# Patient Record
Sex: Male | Born: 1945 | Race: White | Hispanic: No | Marital: Married | Smoking: Former smoker
Health system: Southern US, Community
[De-identification: ages and names within clinical notes are randomized; demographics above are authoritative.]

## PROBLEM LIST (undated history)

## (undated) DIAGNOSIS — E785 Hyperlipidemia, unspecified: Secondary | ICD-10-CM

## (undated) DIAGNOSIS — K649 Unspecified hemorrhoids: Secondary | ICD-10-CM

## (undated) DIAGNOSIS — M069 Rheumatoid arthritis, unspecified: Secondary | ICD-10-CM

## (undated) DIAGNOSIS — G5600 Carpal tunnel syndrome, unspecified upper limb: Secondary | ICD-10-CM

## (undated) DIAGNOSIS — N200 Calculus of kidney: Secondary | ICD-10-CM

## (undated) DIAGNOSIS — I350 Nonrheumatic aortic (valve) stenosis: Secondary | ICD-10-CM

## (undated) DIAGNOSIS — E119 Type 2 diabetes mellitus without complications: Secondary | ICD-10-CM

## (undated) HISTORY — DX: Rheumatoid arthritis, unspecified: M06.9

## (undated) HISTORY — DX: Nonrheumatic aortic (valve) stenosis: I35.0

## (undated) HISTORY — DX: Calculus of kidney: N20.0

## (undated) HISTORY — DX: Carpal tunnel syndrome, unspecified upper limb: G56.00

## (undated) HISTORY — DX: Hyperlipidemia, unspecified: E78.5

## (undated) HISTORY — DX: Type 2 diabetes mellitus without complications: E11.9

## (undated) HISTORY — PX: CARPAL TUNNEL RELEASE: SHX101

---
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
## 2003-04-16 ENCOUNTER — Encounter: Payer: Self-pay | Admitting: *Deleted

## 2003-04-16 ENCOUNTER — Emergency Department (HOSPITAL_COMMUNITY): Admission: EM | Admit: 2003-04-16 | Discharge: 2003-04-16 | Payer: Self-pay | Admitting: *Deleted

## 2005-06-05 ENCOUNTER — Emergency Department (HOSPITAL_COMMUNITY): Admission: EM | Admit: 2005-06-05 | Discharge: 2005-06-05 | Payer: Self-pay | Admitting: Emergency Medicine

## 2006-04-22 ENCOUNTER — Ambulatory Visit: Payer: Self-pay | Admitting: Gastroenterology

## 2010-05-04 ENCOUNTER — Ambulatory Visit: Payer: Self-pay | Admitting: Specialist

## 2010-05-17 ENCOUNTER — Ambulatory Visit: Payer: Self-pay | Admitting: Specialist

## 2011-03-26 ENCOUNTER — Encounter: Payer: Self-pay | Admitting: Rheumatology

## 2011-09-26 ENCOUNTER — Ambulatory Visit: Payer: Self-pay | Admitting: Internal Medicine

## 2011-10-12 ENCOUNTER — Ambulatory Visit: Payer: Self-pay | Admitting: Internal Medicine

## 2013-06-03 ENCOUNTER — Ambulatory Visit (INDEPENDENT_AMBULATORY_CARE_PROVIDER_SITE_OTHER): Payer: Medicare Other | Admitting: Internal Medicine

## 2013-06-03 ENCOUNTER — Encounter: Payer: Self-pay | Admitting: Internal Medicine

## 2013-06-03 VITALS — BP 120/74 | HR 67 | Temp 98.0°F | Ht 70.0 in | Wt 172.5 lb

## 2013-06-03 DIAGNOSIS — E119 Type 2 diabetes mellitus without complications: Secondary | ICD-10-CM

## 2013-06-03 DIAGNOSIS — N200 Calculus of kidney: Secondary | ICD-10-CM

## 2013-06-03 DIAGNOSIS — G5600 Carpal tunnel syndrome, unspecified upper limb: Secondary | ICD-10-CM

## 2013-06-03 DIAGNOSIS — M069 Rheumatoid arthritis, unspecified: Secondary | ICD-10-CM

## 2013-06-03 DIAGNOSIS — Z13220 Encounter for screening for lipoid disorders: Secondary | ICD-10-CM

## 2013-06-07 ENCOUNTER — Encounter: Payer: Self-pay | Admitting: Internal Medicine

## 2013-06-07 DIAGNOSIS — M069 Rheumatoid arthritis, unspecified: Secondary | ICD-10-CM | POA: Insufficient documentation

## 2013-06-07 DIAGNOSIS — G5600 Carpal tunnel syndrome, unspecified upper limb: Secondary | ICD-10-CM | POA: Insufficient documentation

## 2013-06-07 DIAGNOSIS — E119 Type 2 diabetes mellitus without complications: Secondary | ICD-10-CM | POA: Insufficient documentation

## 2013-06-07 DIAGNOSIS — N200 Calculus of kidney: Secondary | ICD-10-CM | POA: Insufficient documentation

## 2013-06-07 NOTE — Assessment & Plan Note (Signed)
Followed by Dr Gavin Potters.  On hydrochloroquin.  Doing well.  Follow.  Obtain records.

## 2013-06-07 NOTE — Assessment & Plan Note (Signed)
Diet controlled.  Stays active.  Sees Dr Richardson for his eyes.  Up to date.    

## 2013-06-07 NOTE — Progress Notes (Signed)
  Subjective:    Patient ID: Donald [name], male    DOB: 19-Sep-1945, 67 y.o.   MRN: [id number]  HPI 67 year old male with past history of rheumatoid arthritis, diabetes mellitus and nephrolithiasis.  He comes in today to follow up on these issues as well as to establish care.  Previous pt of Dr Alison Murray. Also sees Dr Gavin Potters for his arthritis.  On Hydrochloroquin.  Works as a Curator.  Uses his hands a lot.  Feels things are stable.  Sees Dr Gavin Potters every six months.  He has diabetes.  Controls with diet.  Walks bid.  Diagnosed with diabetes in 2011.  He also has had two episodes of kidney stones.  Was evaluated by Dr Reuel Boom.  Last episode - 7-8 years ago.  Stays active.  No cardiac symptoms with increased activity or exertion.  Breathing stable.  No nausea or vomiting.  No  Bowel change.  Last colonoscopy 5-6 years ago (Dr Henrene Hawking).  No polyps.  Sees Dr Senaida Ores for his eyes.  Last evaluated 9/14.     Past Medical History  Diagnosis Date  . Rheumatoid arthritis   . Diabetes mellitus without complication   . Nephrolithiasis     H/O stones  . Carpal tunnel syndrome     Outpatient Encounter Prescriptions as of 06/03/2013  Medication Sig Dispense Refill  . cetirizine (ZYRTEC) 10 MG tablet Take 10 mg by mouth as needed for allergies.       No facility-administered encounter medications on file as of 06/03/2013.    Review of Systems Patient denies any headache, lightheadedness or dizziness.  No sinus or allergy symptoms.  No chest pain, tightness or palpittations.  No increased shortness of breath, cough or congestion.  No nausea or vomiting.  No acid reflux.  No abdominal pain or cramping.  No bowel change, such as diarrhea, constipation, BRBPR or melana.  No urine change.  Overall feels he is doing relatively well.  Hands - stable.   Sees Dr Gavin Potters.        Objective:   Physical Exam Filed Vitals:   06/03/13 0851  BP: 120/74  Pulse: 67  Temp: 98 F (75.35 C)   67 year old male in no  acute distress.  HEENT:  Nares - clear.  Oropharynx - without lesions. NECK:  Supple.  Nontender.  No audible carotid bruit.  HEART:  Appears to be regular.   LUNGS:  No crackles or wheezing audible.  Respirations even and unlabored.   RADIAL PULSE:  Equal bilaterally.  ABDOMEN:  Soft.  Nontender.  Bowel sounds present and normal.  No audible abdominal bruit.    EXTREMITIES:  No increased edema present.  DP pulses palpable and equal bilaterally.     FEET:  Without lesions.      Assessment & Plan:  HEALTH MAINTENANCE.  Schedule a physical when due.  Last colonoscopy 5-6 years ago.  Check psa when due.    I spent 45 minutes with the patient and more than 50% of the time was spent in consultation regarding the above.

## 2013-06-07 NOTE — Assessment & Plan Note (Signed)
Previously saw Dr Daniel.  Las flare 7-8 years ago.  Follow.   

## 2013-06-07 NOTE — Assessment & Plan Note (Signed)
S/p carpal tunnel release.  Doing well.  Follow.   

## 2013-06-17 ENCOUNTER — Other Ambulatory Visit (INDEPENDENT_AMBULATORY_CARE_PROVIDER_SITE_OTHER): Payer: Medicare Other

## 2013-06-17 DIAGNOSIS — E119 Type 2 diabetes mellitus without complications: Secondary | ICD-10-CM

## 2013-06-17 LAB — COMPREHENSIVE METABOLIC PANEL
ALT: 17 U/L (ref 0–53)
AST: 24 U/L (ref 0–37)
Calcium: 9.5 mg/dL (ref 8.4–10.5)
Chloride: 106 mEq/L (ref 96–112)
Creatinine, Ser: 1.1 mg/dL (ref 0.4–1.5)
Glucose, Bld: 108 mg/dL — ABNORMAL HIGH (ref 70–99)
Total Bilirubin: 0.6 mg/dL (ref 0.3–1.2)
Total Protein: 7.1 g/dL (ref 6.0–8.3)

## 2013-06-17 LAB — LIPID PANEL
HDL: 48.2 mg/dL (ref >39.00)
LDL Cholesterol: 121 mg/dL — ABNORMAL HIGH (ref 0–99)
Total CHOL/HDL Ratio: 4
Triglycerides: 84 mg/dL (ref 0.0–149.0)

## 2013-06-22 ENCOUNTER — Encounter: Payer: Self-pay | Admitting: *Deleted

## 2013-09-30 ENCOUNTER — Telehealth: Payer: Self-pay | Admitting: Internal Medicine

## 2013-09-30 NOTE — Telephone Encounter (Signed)
No request received thus far, & I do not know the name of the pharmacy to send it to. Will wait to hear from his insurance as mentioned

## 2013-09-30 NOTE — Telephone Encounter (Signed)
Pt called stating his insurance will be calling to get rx   This will be a mail order  Through [employer]  His needs refill One touch ultra test strips. For 90 days

## 2013-10-01 ENCOUNTER — Telehealth: Payer: Self-pay | Admitting: *Deleted

## 2013-10-01 MED ORDER — GLUCOSE BLOOD VI STRP: ORAL_STRIP | Status: DC

## 2013-10-01 NOTE — Telephone Encounter (Signed)
Refill sent.

## 2013-10-01 NOTE — Telephone Encounter (Signed)
Refill Request  One touch ultra test strip

## 2013-10-02 ENCOUNTER — Telehealth: Payer: Self-pay | Admitting: *Deleted

## 2013-10-02 NOTE — Telephone Encounter (Signed)
Refill request  One touch ultra test strips

## 2013-10-02 NOTE — Telephone Encounter (Signed)
Completed yesterday

## 2013-10-06 ENCOUNTER — Telehealth: Payer: Self-pay | Admitting: *Deleted

## 2013-10-06 NOTE — Telephone Encounter (Signed)
Faxed 10/01/13

## 2013-10-06 NOTE — Telephone Encounter (Signed)
Refill Request  One touch Ultra test strip

## 2013-11-24 ENCOUNTER — Encounter (INDEPENDENT_AMBULATORY_CARE_PROVIDER_SITE_OTHER): Payer: Self-pay

## 2013-11-24 ENCOUNTER — Encounter: Payer: Self-pay | Admitting: Internal Medicine

## 2013-11-24 ENCOUNTER — Ambulatory Visit (INDEPENDENT_AMBULATORY_CARE_PROVIDER_SITE_OTHER): Payer: Managed Care, Other (non HMO) | Admitting: Internal Medicine

## 2013-11-24 VITALS — BP 120/70 | HR 67 | Temp 98.0°F | Ht 69.5 in | Wt 174.8 lb

## 2013-11-24 DIAGNOSIS — N200 Calculus of kidney: Secondary | ICD-10-CM

## 2013-11-24 DIAGNOSIS — G2581 Restless legs syndrome: Secondary | ICD-10-CM

## 2013-11-24 DIAGNOSIS — Z125 Encounter for screening for malignant neoplasm of prostate: Secondary | ICD-10-CM

## 2013-11-24 DIAGNOSIS — M069 Rheumatoid arthritis, unspecified: Secondary | ICD-10-CM

## 2013-11-24 DIAGNOSIS — G5600 Carpal tunnel syndrome, unspecified upper limb: Secondary | ICD-10-CM

## 2013-11-24 DIAGNOSIS — Z1211 Encounter for screening for malignant neoplasm of colon: Secondary | ICD-10-CM

## 2013-11-24 DIAGNOSIS — E119 Type 2 diabetes mellitus without complications: Secondary | ICD-10-CM

## 2013-11-24 NOTE — Progress Notes (Signed)
Pre-visit discussion using our clinic review tool. No additional management support is needed unless otherwise documented below in the visit note.  

## 2013-11-24 NOTE — Assessment & Plan Note (Addendum)
S/p carpal tunnel release.  Doing well.  Follow.

## 2013-11-24 NOTE — Progress Notes (Signed)
  Subjective:    Patient ID: Donald [name], male    DOB: 02/15/1946, 68 y.o.   MRN: [id number]  HPI 68 year old male with past history of rheumatoid arthritis, diabetes mellitus and nephrolithiasis.  He comes in today to follow up on these issues as well as for a complete physical exam.   Sees Dr [name] for his arthritis.  On Hydrochloroquin.  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  Feels things are stable.  He has diabetes.  Controls with diet.  Walks.  States am sugars averging 89-103 and pm sugars two hours after eating averaging 120-140.   He also has had two episodes of kidney stones.  Was evaluated by Dr Quillian Quince.  Last episode - 7-8 years ago.  Stays active.  No cardiac symptoms with increased activity or exertion.  Breathing stable.  No nausea or vomiting.  No  Bowel change.  Last colonoscopy 5-6 years ago (Dr Sonny Masters).  No polyps.  Sees Dr Marvel Plan for his eyes.  Last evaluated 9/14.  He does report problems with restless legs.      Past Medical History  Diagnosis Date  . Rheumatoid arthritis   . Diabetes mellitus without complication   . Nephrolithiasis     H/O stones  . Carpal tunnel syndrome     Outpatient Encounter Prescriptions as of 11/24/2013  Medication Sig  . cetirizine (ZYRTEC) 10 MG tablet Take 10 mg by mouth as needed for allergies.  Marland Kitchen glucose blood test strip Use as instructed    Review of Systems Patient denies any headache, lightheadedness or dizziness.  No sinus or allergy symptoms.  No chest pain, tightness or palpittations.  No increased shortness of breath, cough or congestion.  No nausea or vomiting.  No acid reflux.  No abdominal pain or cramping.  No bowel change, such as diarrhea, constipation, BRBPR or melana.  No urine change.  Overall feels he is doing relatively well.  Hands - stable.   Sees Dr [name].  Restless legs as outlined.  Sugars as outlined.      Objective:   Physical Exam  Filed Vitals:   11/24/13 1046  BP: 120/70  Pulse: 67  Temp:  98 F (36.7 C)   Blood pressure recheck:  21/5  68 year old male in no acute distress.  HEENT:  Nares - clear.  Oropharynx - without lesions. NECK:  Supple.  Nontender.  No audible carotid bruit.  HEART:  Appears to be regular.   LUNGS:  No crackles or wheezing audible.  Respirations even and unlabored.   RADIAL PULSE:  Equal bilaterally.  ABDOMEN:  Soft.  Nontender.  Bowel sounds present and normal.  No audible abdominal bruit.  GU:  Normal descended testicles.  No palpable testicular nodules.   RECTAL:  Could not appreciate any palpable prostate nodules.  Heme negative.   EXTREMITIES:  No increased edema present.  DP pulses palpable and equal bilaterally.   FEET:  No lesions.        Assessment & Plan:  HEALTH MAINTENANCE.  Physical today.   Last colonoscopy 5-6 years ago.  Check psa with next labs.   I spent 25 minutes with the patient and more than 50% of the time was spent in consultation regarding the above.

## 2013-11-28 ENCOUNTER — Encounter: Payer: Self-pay | Admitting: Internal Medicine

## 2013-11-29 DIAGNOSIS — G2581 Restless legs syndrome: Secondary | ICD-10-CM | POA: Insufficient documentation

## 2013-11-29 NOTE — Assessment & Plan Note (Signed)
Diet controlled.  Stays active.  Sees Dr Richardson for his eyes.  Up to date.    

## 2013-11-29 NOTE — Assessment & Plan Note (Signed)
Having issues with restless legs.  Check labs and iron.  Discussed treatment.  Await lab results.

## 2013-11-29 NOTE — Assessment & Plan Note (Signed)
Followed by Dr Kernodle.  On hydrochloroquin.  Doing well.  Follow.   

## 2013-11-29 NOTE — Assessment & Plan Note (Signed)
Previously saw Dr Quillian Quince.  Las flare 7-8 years ago.  Follow.

## 2013-12-08 ENCOUNTER — Other Ambulatory Visit (INDEPENDENT_AMBULATORY_CARE_PROVIDER_SITE_OTHER): Payer: Managed Care, Other (non HMO)

## 2013-12-08 DIAGNOSIS — Z125 Encounter for screening for malignant neoplasm of prostate: Secondary | ICD-10-CM

## 2013-12-08 DIAGNOSIS — Z1211 Encounter for screening for malignant neoplasm of colon: Secondary | ICD-10-CM

## 2013-12-08 DIAGNOSIS — G2581 Restless legs syndrome: Secondary | ICD-10-CM

## 2013-12-08 DIAGNOSIS — M069 Rheumatoid arthritis, unspecified: Secondary | ICD-10-CM

## 2013-12-08 DIAGNOSIS — E119 Type 2 diabetes mellitus without complications: Secondary | ICD-10-CM

## 2013-12-08 LAB — TSH: TSH: 1.6 u[IU]/mL (ref 0.35–5.50)

## 2013-12-08 LAB — COMPREHENSIVE METABOLIC PANEL
ALBUMIN: 4.2 g/dL (ref 3.5–5.2)
ALT: 16 U/L (ref 0–53)
AST: 20 U/L (ref 0–37)
Alkaline Phosphatase: 55 U/L (ref 39–117)
BUN: 20 mg/dL (ref 6–23)
CALCIUM: 9.5 mg/dL (ref 8.4–10.5)
CO2: 28 mEq/L (ref 19–32)
Chloride: 105 mEq/L (ref 96–112)
Creatinine, Ser: 1.1 mg/dL (ref 0.4–1.5)
GFR: 71.21 mL/min (ref >60.00)
Glucose, Bld: 106 mg/dL — ABNORMAL HIGH (ref 70–99)
Potassium: 4.8 mEq/L (ref 3.5–5.1)
Sodium: 140 mEq/L (ref 135–145)
Total Bilirubin: 0.8 mg/dL (ref 0.3–1.2)
Total Protein: 6.6 g/dL (ref 6.0–8.3)

## 2013-12-08 LAB — PSA, MEDICARE: PSA: 0.9 ng/mL (ref 0.10–4.00)

## 2013-12-08 LAB — LIPID PANEL
CHOLESTEROL: 182 mg/dL (ref 0–200)
HDL: 47 mg/dL (ref >39.00)
LDL CALC: 117 mg/dL — AB (ref 0–99)
TRIGLYCERIDES: 91 mg/dL (ref 0.0–149.0)
Total CHOL/HDL Ratio: 4
VLDL: 18.2 mg/dL (ref 0.0–40.0)

## 2013-12-08 LAB — FECAL OCCULT BLOOD, IMMUNOCHEMICAL: Fecal Occult Bld: POSITIVE — AB

## 2013-12-08 LAB — CBC WITH DIFFERENTIAL/PLATELET
BASOS PCT: 0.6 % (ref 0.0–3.0)
Basophils Absolute: 0 10*3/uL (ref 0.0–0.1)
EOS PCT: 4.2 % (ref 0.0–5.0)
Eosinophils Absolute: 0.2 10*3/uL (ref 0.0–0.7)
HCT: 41.5 % (ref 39.0–52.0)
Hemoglobin: 14.2 g/dL (ref 13.0–17.0)
Lymphocytes Relative: 21.3 % (ref 12.0–46.0)
Lymphs Abs: 1.1 10*3/uL (ref 0.7–4.0)
MCHC: 34.1 g/dL (ref 30.0–36.0)
MCV: 99.7 fl (ref 78.0–100.0)
MONOS PCT: 9.9 % (ref 3.0–12.0)
Monocytes Absolute: 0.5 10*3/uL (ref 0.1–1.0)
NEUTROS PCT: 64 % (ref 43.0–77.0)
Neutro Abs: 3.2 10*3/uL (ref 1.4–7.7)
Platelets: 199 10*3/uL (ref 150.0–400.0)
RBC: 4.17 Mil/uL — ABNORMAL LOW (ref 4.22–5.81)
RDW: 12.3 % (ref 11.5–14.6)
WBC: 4.9 10*3/uL (ref 4.5–10.5)

## 2013-12-08 LAB — IBC PANEL
Iron: 86 ug/dL (ref 42–165)
Saturation Ratios: 26.7 % (ref 20.0–50.0)
Transferrin: 230.2 mg/dL (ref 212.0–360.0)

## 2013-12-08 LAB — FERRITIN: Ferritin: 138.1 ng/mL (ref 22.0–322.0)

## 2013-12-08 LAB — HEMOGLOBIN A1C: HEMOGLOBIN A1C: 5.8 % (ref 4.6–6.5)

## 2013-12-09 ENCOUNTER — Encounter: Payer: Medicare Other | Admitting: Internal Medicine

## 2013-12-09 ENCOUNTER — Encounter: Payer: Self-pay | Admitting: *Deleted

## 2013-12-11 ENCOUNTER — Other Ambulatory Visit: Payer: Self-pay | Admitting: Internal Medicine

## 2013-12-11 DIAGNOSIS — R195 Other fecal abnormalities: Secondary | ICD-10-CM

## 2013-12-11 NOTE — Progress Notes (Signed)
Order placed for GI referral.   

## 2014-01-04 ENCOUNTER — Telehealth: Payer: Self-pay | Admitting: *Deleted

## 2014-01-04 MED ORDER — GLUCOSE BLOOD VI STRP: ORAL_STRIP | Status: DC

## 2014-01-04 NOTE — Telephone Encounter (Signed)
Refill Request  Ultra test strip

## 2014-02-08 ENCOUNTER — Ambulatory Visit: Payer: Self-pay | Admitting: Gastroenterology

## 2014-02-08 LAB — HM COLONOSCOPY

## 2014-02-09 LAB — PATHOLOGY REPORT

## 2014-02-17 ENCOUNTER — Encounter: Payer: Self-pay | Admitting: Internal Medicine

## 2014-02-17 DIAGNOSIS — R195 Other fecal abnormalities: Secondary | ICD-10-CM

## 2014-03-30 ENCOUNTER — Emergency Department: Payer: Self-pay | Admitting: Internal Medicine

## 2014-03-30 ENCOUNTER — Ambulatory Visit: Payer: Self-pay | Admitting: Family Medicine

## 2014-03-30 LAB — COMPREHENSIVE METABOLIC PANEL
ALBUMIN: 4.2 g/dL (ref 3.4–5.0)
ALK PHOS: 65 U/L
ANION GAP: 8 (ref 7–16)
BUN: 29 mg/dL — ABNORMAL HIGH (ref 7–18)
Bilirubin,Total: 0.7 mg/dL (ref 0.2–1.0)
CREATININE: 1.48 mg/dL — AB (ref 0.60–1.30)
Calcium, Total: 9.2 mg/dL (ref 8.5–10.1)
Chloride: 105 mmol/L (ref 98–107)
Co2: 24 mmol/L (ref 21–32)
EGFR (African American): 56 — ABNORMAL LOW
EGFR (Non-African Amer.): 49 — ABNORMAL LOW
GLUCOSE: 117 mg/dL — AB (ref 65–99)
Osmolality: 281 (ref 275–301)
Potassium: 4.8 mmol/L (ref 3.5–5.1)
SGOT(AST): 38 U/L — ABNORMAL HIGH (ref 15–37)
SGPT (ALT): 25 U/L (ref 12–78)
Sodium: 137 mmol/L (ref 136–145)
TOTAL PROTEIN: 8.1 g/dL (ref 6.4–8.2)

## 2014-03-30 LAB — URINALYSIS, COMPLETE
Bacteria: NONE SEEN
Bilirubin,UR: NEGATIVE
Glucose,UR: NEGATIVE mg/dL (ref 0–75)
Leukocyte Esterase: NEGATIVE
Nitrite: NEGATIVE
Ph: 5 (ref 4.5–8.0)
Protein: 30
Specific Gravity: 1.032 (ref 1.003–1.030)
Squamous Epithelial: NONE SEEN
WBC UR: 2 /HPF (ref 0–5)

## 2014-03-30 LAB — CBC
HCT: 44.5 % (ref 40.0–52.0)
HGB: 15.2 g/dL (ref 13.0–18.0)
MCH: 33.6 pg (ref 26.0–34.0)
MCHC: 34.1 g/dL (ref 32.0–36.0)
MCV: 99 fL (ref 80–100)
Platelet: 219 10*3/uL (ref 150–440)
RBC: 4.52 10*6/uL (ref 4.40–5.90)
RDW: 12.3 % (ref 11.5–14.5)
WBC: 12.5 10*3/uL — ABNORMAL HIGH (ref 3.8–10.6)

## 2014-04-07 ENCOUNTER — Other Ambulatory Visit: Payer: Self-pay | Admitting: Internal Medicine

## 2014-04-08 ENCOUNTER — Ambulatory Visit: Payer: Self-pay | Admitting: Urology

## 2014-05-28 ENCOUNTER — Encounter: Payer: Self-pay | Admitting: Internal Medicine

## 2014-05-28 ENCOUNTER — Ambulatory Visit (INDEPENDENT_AMBULATORY_CARE_PROVIDER_SITE_OTHER): Payer: Managed Care, Other (non HMO) | Admitting: Internal Medicine

## 2014-05-28 VITALS — BP 110/60 | HR 65 | Temp 98.0°F | Ht 69.5 in | Wt 176.0 lb

## 2014-05-28 DIAGNOSIS — E7800 Pure hypercholesterolemia, unspecified: Secondary | ICD-10-CM

## 2014-05-28 DIAGNOSIS — N200 Calculus of kidney: Secondary | ICD-10-CM

## 2014-05-28 DIAGNOSIS — M069 Rheumatoid arthritis, unspecified: Secondary | ICD-10-CM

## 2014-05-28 DIAGNOSIS — E119 Type 2 diabetes mellitus without complications: Secondary | ICD-10-CM

## 2014-05-28 DIAGNOSIS — R739 Hyperglycemia, unspecified: Secondary | ICD-10-CM

## 2014-05-28 DIAGNOSIS — R7309 Other abnormal glucose: Secondary | ICD-10-CM

## 2014-05-28 DIAGNOSIS — R195 Other fecal abnormalities: Secondary | ICD-10-CM

## 2014-05-28 LAB — LIPID PANEL
CHOLESTEROL: 196 mg/dL (ref 0–200)
HDL: 41.1 mg/dL (ref >39.00)
LDL Cholesterol: 132 mg/dL — ABNORMAL HIGH (ref 0–99)
NONHDL: 154.9
TRIGLYCERIDES: 114 mg/dL (ref 0.0–149.0)
Total CHOL/HDL Ratio: 5
VLDL: 22.8 mg/dL (ref 0.0–40.0)

## 2014-05-28 LAB — COMPREHENSIVE METABOLIC PANEL
ALT: 16 U/L (ref 0–53)
AST: 22 U/L (ref 0–37)
Albumin: 4.1 g/dL (ref 3.5–5.2)
Alkaline Phosphatase: 56 U/L (ref 39–117)
BILIRUBIN TOTAL: 0.7 mg/dL (ref 0.2–1.2)
BUN: 15 mg/dL (ref 6–23)
CO2: 26 meq/L (ref 19–32)
CREATININE: 1.1 mg/dL (ref 0.4–1.5)
Calcium: 9.2 mg/dL (ref 8.4–10.5)
Chloride: 106 mEq/L (ref 96–112)
GFR: 73.42 mL/min (ref >60.00)
GLUCOSE: 112 mg/dL — AB (ref 70–99)
Potassium: 4.6 mEq/L (ref 3.5–5.1)
Sodium: 141 mEq/L (ref 135–145)
Total Protein: 7.4 g/dL (ref 6.0–8.3)

## 2014-05-28 LAB — HEMOGLOBIN A1C: Hgb A1c MFr Bld: 5.9 % (ref 4.6–6.5)

## 2014-05-28 NOTE — Progress Notes (Signed)
Pre visit review using our clinic review tool, if applicable. No additional management support is needed unless otherwise documented below in the visit note. 

## 2014-05-30 ENCOUNTER — Encounter: Payer: Self-pay | Admitting: Internal Medicine

## 2014-05-30 NOTE — Assessment & Plan Note (Signed)
Recent flare.  Seeing Dr Yves Dill.  Has f/u planned in one month.

## 2014-05-30 NOTE — Progress Notes (Signed)
  Subjective:    Patient ID: Donald [name], male    DOB: [birth year], 68 y.o.   MRN: [id number]  HPI 68 year old male with past history of rheumatoid arthritis, diabetes mellitus and nephrolithiasis.  He comes in today for a scheduled follow up.   Sees Dr [name] for his arthritis.  On Hydrochloroquin.  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  Feels things are stable.  He has diabetes.  Controls with diet. A1c last checked 5.8.   Walks.   Six weeks ago, started having increased pain.  Evaluated in ER.  Had kidney stones.  Seeing Dr Yves Dill.  Is s/p lithotripsy.  Had f/u two weeks ago.  States still some stones present.  Planning f/u in one month.  Wanted to hold on further lithotripsy this close to last.  Still has some intermittent pain.  No nausea or vomiting.  Stays active.  No cardiac symptoms with increased activity or exertion.  Breathing stable.  No bowel change.  Last colonoscopy 5-6 years ago (Dr Sonny Masters).  No polyps.  Sees Dr Marvel Plan for his eyes.  Last evaluated 9/14.      Past Medical History  Diagnosis Date  . Rheumatoid arthritis   . Diabetes mellitus without complication   . Nephrolithiasis     H/O stones  . Carpal tunnel syndrome     Outpatient Encounter Prescriptions as of 05/28/2014  Medication Sig  . cetirizine (ZYRTEC) 10 MG tablet Take 10 mg by mouth as needed for allergies.  Marland Kitchen glucose blood test strip Ultra test strips. Use twice daily. Dx 250.00    Review of Systems Patient denies any headache, lightheadedness or dizziness.  No sinus or allergy symptoms.  No chest pain, tightness or palpittations.  No increased shortness of breath, cough or congestion.  No nausea or vomiting.  No acid reflux.  No abdominal pain or cramping.  No bowel change, such as diarrhea, constipation, BRBPR or melana.  No urine change.  Overall feels he is doing relatively well.  Hands - stable.   Sees Dr [name].  Sugars have been doing well.  Has had problems with kidney stones as outlined  above.      Objective:   Physical Exam  Filed Vitals:   05/28/14 0806  BP: 110/60  Pulse: 65  Temp: 98 F (36.7 C)   Blood pressure recheck:  28/86  68 year old male in no acute distress.  HEENT:  Nares - clear.  Oropharynx - without lesions. NECK:  Supple.  Nontender.  No audible carotid bruit.  HEART:  Appears to be regular.   LUNGS:  No crackles or wheezing audible.  Respirations even and unlabored.   RADIAL PULSE:  Equal bilaterally.  ABDOMEN:  Soft.  Nontender.  Bowel sounds present and normal.  No audible abdominal bruit.   EXTREMITIES:  No increased edema present.  DP pulses palpable and equal bilaterally.   FEET:  No lesions.        Assessment & Plan:  HEALTH MAINTENANCE.  Physical 11/24/13.   Last colonoscopy 02/08/14 - internal hemorrhoids.  PSA 12/08/13 - .91.

## 2014-05-30 NOTE — Assessment & Plan Note (Signed)
Diet controlled.  Stays active.  Sees Dr Marvel Plan for his eyes.  Up to date.

## 2014-05-30 NOTE — Assessment & Plan Note (Signed)
IFOB positive.  Colonoscopy 02/08/14 - internal hemorrhoids otherwise normal.

## 2014-05-30 NOTE — Assessment & Plan Note (Signed)
Followed by Dr [name].  On hydrochloroquin.  Doing well.  Follow.

## 2014-05-31 ENCOUNTER — Encounter: Payer: Self-pay | Admitting: *Deleted

## 2014-08-16 LAB — HM DIABETES EYE EXAM

## 2014-11-29 ENCOUNTER — Encounter: Payer: Self-pay | Admitting: Internal Medicine

## 2014-11-29 ENCOUNTER — Ambulatory Visit (INDEPENDENT_AMBULATORY_CARE_PROVIDER_SITE_OTHER): Payer: Managed Care, Other (non HMO) | Admitting: Internal Medicine

## 2014-11-29 VITALS — BP 110/70 | HR 73 | Temp 97.8°F | Ht 69.0 in | Wt 181.4 lb

## 2014-11-29 DIAGNOSIS — N200 Calculus of kidney: Secondary | ICD-10-CM

## 2014-11-29 DIAGNOSIS — Z0000 Encounter for general adult medical examination without abnormal findings: Secondary | ICD-10-CM

## 2014-11-29 DIAGNOSIS — Z125 Encounter for screening for malignant neoplasm of prostate: Secondary | ICD-10-CM | POA: Diagnosis not present

## 2014-11-29 DIAGNOSIS — E119 Type 2 diabetes mellitus without complications: Secondary | ICD-10-CM | POA: Diagnosis not present

## 2014-11-29 DIAGNOSIS — E7800 Pure hypercholesterolemia, unspecified: Secondary | ICD-10-CM

## 2014-11-29 DIAGNOSIS — M069 Rheumatoid arthritis, unspecified: Secondary | ICD-10-CM | POA: Diagnosis not present

## 2014-11-29 LAB — BASIC METABOLIC PANEL
BUN: 17 mg/dL (ref 6–23)
CO2: 29 mEq/L (ref 19–32)
Calcium: 9.4 mg/dL (ref 8.4–10.5)
Chloride: 106 mEq/L (ref 96–112)
Creatinine, Ser: 1.09 mg/dL (ref 0.40–1.50)
GFR: 71.75 mL/min (ref >60.00)
GLUCOSE: 110 mg/dL — AB (ref 70–99)
POTASSIUM: 5.5 meq/L — AB (ref 3.5–5.1)
SODIUM: 139 meq/L (ref 135–145)

## 2014-11-29 LAB — CBC WITH DIFFERENTIAL/PLATELET
BASOS ABS: 0 10*3/uL (ref 0.0–0.1)
BASOS PCT: 0.6 % (ref 0.0–3.0)
EOS PCT: 3.5 % (ref 0.0–5.0)
Eosinophils Absolute: 0.2 10*3/uL (ref 0.0–0.7)
HCT: 42.6 % (ref 39.0–52.0)
HEMOGLOBIN: 14.4 g/dL (ref 13.0–17.0)
Lymphocytes Relative: 18.2 % (ref 12.0–46.0)
Lymphs Abs: 0.8 10*3/uL (ref 0.7–4.0)
MCHC: 33.9 g/dL (ref 30.0–36.0)
MCV: 98.2 fl (ref 78.0–100.0)
MONO ABS: 0.5 10*3/uL (ref 0.1–1.0)
MONOS PCT: 10.7 % (ref 3.0–12.0)
NEUTROS ABS: 3.1 10*3/uL (ref 1.4–7.7)
Neutrophils Relative %: 67 % (ref 43.0–77.0)
Platelets: 223 10*3/uL (ref 150.0–400.0)
RBC: 4.33 Mil/uL (ref 4.22–5.81)
RDW: 12.8 % (ref 11.5–15.5)
WBC: 4.6 10*3/uL (ref 4.0–10.5)

## 2014-11-29 LAB — HEPATIC FUNCTION PANEL
ALBUMIN: 4.3 g/dL (ref 3.5–5.2)
ALT: 16 U/L (ref 0–53)
AST: 19 U/L (ref 0–37)
Alkaline Phosphatase: 63 U/L (ref 39–117)
Bilirubin, Direct: 0.1 mg/dL (ref 0.0–0.3)
Total Bilirubin: 0.5 mg/dL (ref 0.2–1.2)
Total Protein: 6.9 g/dL (ref 6.0–8.3)

## 2014-11-29 LAB — LIPID PANEL
CHOL/HDL RATIO: 4
Cholesterol: 173 mg/dL (ref 0–200)
HDL: 44.6 mg/dL (ref >39.00)
LDL Cholesterol: 109 mg/dL — ABNORMAL HIGH (ref 0–99)
NonHDL: 128.4
Triglycerides: 95 mg/dL (ref 0.0–149.0)
VLDL: 19 mg/dL (ref 0.0–40.0)

## 2014-11-29 LAB — PSA, MEDICARE: PSA: 1.33 ng/ml (ref 0.10–4.00)

## 2014-11-29 LAB — HEMOGLOBIN A1C: Hgb A1c MFr Bld: 6.1 % (ref 4.6–6.5)

## 2014-11-29 LAB — TSH: TSH: 0.86 u[IU]/mL (ref 0.35–4.50)

## 2014-11-29 NOTE — Progress Notes (Signed)
Patient ID: Donald [name], male   DOB: [birth year], 69 y.o.   MRN: [id number]   Subjective:    Patient ID: Donald [name], male    DOB: 1946-01-27, 69 y.o.   MRN: [id number]  HPI  Patient here for his physical exam.   States he is doing well.  Tries to watch his diet.  Blood sugars averaging 90-95 in the am and 130 in the evening.  No low blood sugars.  Stays active.  No cardiac symptoms with increased activity or exertion.  Breathing stable.  Bowels stable.  No problems with kidney stones recently.     Past Medical History  Diagnosis Date  . Rheumatoid arthritis   . Diabetes mellitus without complication   . Nephrolithiasis     H/O stones  . Carpal tunnel syndrome     Current Outpatient Prescriptions on File Prior to Visit  Medication Sig Dispense Refill  . cetirizine (ZYRTEC) 10 MG tablet Take 10 mg by mouth as needed for allergies.    Marland Kitchen glucose blood test strip Ultra test strips. Use twice daily. Dx 250.00 200 each 5   No current facility-administered medications on file prior to visit.    Review of Systems  Constitutional: Negative for appetite change and unexpected weight change.  HENT: Negative for congestion and sinus pressure.   Eyes: Negative for pain and visual disturbance.  Respiratory: Negative for cough, chest tightness and shortness of breath.   Cardiovascular: Negative for chest pain, palpitations and leg swelling.  Gastrointestinal: Negative for nausea, vomiting, abdominal pain and diarrhea.  Genitourinary: Negative for frequency and difficulty urinating.  Musculoskeletal: Negative for back pain and joint swelling.       Arthrits stable.   Skin: Negative for color change and rash.  Neurological: Negative for dizziness, light-headedness and headaches.  Hematological: Negative for adenopathy. Does not bruise/bleed easily.  Psychiatric/Behavioral: Negative for dysphoric mood and agitation.       Objective:     Blood pressure recheck:  1222/74  Physical Exam    Constitutional: He is oriented to person, place, and time. He appears well-developed and well-nourished. No distress.  HENT:  Head: Normocephalic and atraumatic.  Nose: Nose normal.  Mouth/Throat: Oropharynx is clear and moist. No oropharyngeal exudate.  Eyes: Conjunctivae are normal. Right eye exhibits no discharge. Left eye exhibits no discharge.  Neck: Neck supple. No thyromegaly present.  Cardiovascular: Normal rate and regular rhythm.   Pulmonary/Chest: Breath sounds normal. No respiratory distress. He has no wheezes.  Abdominal: Soft. Bowel sounds are normal. There is no tenderness.  Genitourinary:  Normal descended testicles.  Rectal:  No palpable prostate nodules.  Heme negative.    Musculoskeletal: He exhibits no edema or tenderness.  Lymphadenopathy:    He has no cervical adenopathy.  Neurological: He is alert and oriented to person, place, and time.  Skin: Skin is warm and dry. No rash noted.  Psychiatric: He has a normal mood and affect. His behavior is normal.    BP 110/70 mmHg  Pulse 73  Temp(Src) 97.8 F (36.6 C) (Oral)  Ht _0  (1.753 m)  Wt 181 lb 6 oz (82.271 kg)  BMI 26.77 kg/m2  SpO2 97% Wt Readings from Last 3 Encounters:  11/29/14 181 lb 6 oz (82.271 kg)  05/28/14 176 lb (79.833 kg)  11/24/13 174 lb 12 oz (79.266 kg)     Lab Results  Component Value Date   WBC 4.6 11/29/2014   HGB 14.4 11/29/2014   HCT 42.6 11/29/2014  PLT 223.0 11/29/2014   GLUCOSE 110* 11/29/2014   CHOL 173 11/29/2014   TRIG 95.0 11/29/2014   HDL 44.60 11/29/2014   LDLCALC 109* 11/29/2014   ALT 16 11/29/2014   AST 19 11/29/2014   NA 139 11/29/2014   K 5.5* 11/29/2014   CL 106 11/29/2014   CREATININE 1.09 11/29/2014   BUN 17 11/29/2014   CO2 29 11/29/2014   TSH 0.86 11/29/2014   PSA 1.33 11/29/2014   HGBA1C 6.1 11/29/2014       Assessment & Plan:   Problem List Items Addressed This Visit    Diabetes - Primary    Low carb diet, exercise.  Last a1c 05/2014 - 5.9.   Has been welll controlled.  Follow.  Recheck met b and a1c.        Relevant Orders   TSH (Completed)   Hemoglobin A1c (Completed)   Basic metabolic panel (Completed)   Health care maintenance    Physical today.  Check psa.  Colonoscopy 02/08/14 - internal hemorrhoids.        Hypercholesterolemia    Low cholesterol diet and exercise.  Follow lipid panel.  Last LDL 05/2014 - 132.        Relevant Orders   Lipid panel (Completed)   Nephrolithiasis    Sees Dr Yves Dill.  No recent flare.  Follow.        Rheumatoid arthritis    Sees Dr [name] every December.  Stable.  On Plaquenil.        Relevant Medications   hydroxychloroquine (PLAQUENIL) 200 MG tablet   Other Relevant Orders   Hepatic function panel (Completed)   CBC with Differential/Platelet (Completed)    Other Visit Diagnoses    Prostate cancer screening        Relevant Orders    PSA, Medicare (Completed)      I spent 25 minutes with the patient and more than 50% of the time was spent in consultation regarding the above.     Einar Pheasant, MD

## 2014-11-29 NOTE — Progress Notes (Signed)
Pre visit review using our clinic review tool, if applicable. No additional management support is needed unless otherwise documented below in the visit note. 

## 2014-12-06 ENCOUNTER — Encounter: Payer: Self-pay | Admitting: Internal Medicine

## 2014-12-06 ENCOUNTER — Other Ambulatory Visit (INDEPENDENT_AMBULATORY_CARE_PROVIDER_SITE_OTHER): Payer: Managed Care, Other (non HMO)

## 2014-12-06 ENCOUNTER — Telehealth: Payer: Self-pay | Admitting: *Deleted

## 2014-12-06 DIAGNOSIS — E875 Hyperkalemia: Secondary | ICD-10-CM

## 2014-12-06 DIAGNOSIS — Z0000 Encounter for general adult medical examination without abnormal findings: Secondary | ICD-10-CM | POA: Insufficient documentation

## 2014-12-06 LAB — POTASSIUM: POTASSIUM: 4.8 meq/L (ref 3.5–5.1)

## 2014-12-06 NOTE — Assessment & Plan Note (Signed)
Sees Dr Yves Dill.  No recent flare.  Follow.

## 2014-12-06 NOTE — Assessment & Plan Note (Signed)
Physical today.  Check psa.  Colonoscopy 02/08/14 - internal hemorrhoids.

## 2014-12-06 NOTE — Telephone Encounter (Signed)
Labs and dx?  

## 2014-12-06 NOTE — Telephone Encounter (Signed)
Noted.  Will not recommend changing anything until I see the f/u potassium.  Blood could have been hemolyzed.

## 2014-12-06 NOTE — Assessment & Plan Note (Signed)
Low carb diet, exercise.  Last a1c 05/2014 - 5.9.  Has been welll controlled.  Follow.  Recheck met b and a1c.

## 2014-12-06 NOTE — Assessment & Plan Note (Signed)
Sees Dr [name] every December.  Stable.  On Plaquenil.

## 2014-12-06 NOTE — Assessment & Plan Note (Signed)
Low cholesterol diet and exercise.  Follow lipid panel.  Last LDL 05/2014 - 132.

## 2014-12-06 NOTE — Telephone Encounter (Signed)
Order placed for stat potassium.  

## 2014-12-06 NOTE — Telephone Encounter (Signed)
Pt called back & stated that he thinks he knows why his Potassium is elevated. He is taking a vitamin supplement with 99mg  of Potassium per tablet (pt takes two daily). The tablet is called "Theralith". Started taking it in late August after kidney stones.

## 2014-12-07 ENCOUNTER — Encounter: Payer: Self-pay | Admitting: *Deleted

## 2014-12-07 NOTE — Telephone Encounter (Signed)
Pt notified in addition to him normal lab result

## 2015-01-12 ENCOUNTER — Encounter: Payer: Self-pay | Admitting: *Deleted

## 2015-05-12 ENCOUNTER — Other Ambulatory Visit: Payer: Self-pay | Admitting: Internal Medicine

## 2015-06-01 ENCOUNTER — Encounter: Payer: Self-pay | Admitting: Internal Medicine

## 2015-06-01 ENCOUNTER — Ambulatory Visit (INDEPENDENT_AMBULATORY_CARE_PROVIDER_SITE_OTHER): Payer: Managed Care, Other (non HMO) | Admitting: Internal Medicine

## 2015-06-01 VITALS — BP 120/80 | HR 69 | Temp 98.1°F | Ht 69.0 in | Wt 180.5 lb

## 2015-06-01 DIAGNOSIS — E119 Type 2 diabetes mellitus without complications: Secondary | ICD-10-CM

## 2015-06-01 DIAGNOSIS — E7800 Pure hypercholesterolemia, unspecified: Secondary | ICD-10-CM

## 2015-06-01 DIAGNOSIS — M069 Rheumatoid arthritis, unspecified: Secondary | ICD-10-CM | POA: Diagnosis not present

## 2015-06-01 LAB — MICROALBUMIN / CREATININE URINE RATIO
CREATININE, U: 32.2 mg/dL
MICROALB/CREAT RATIO: 2.2 mg/g (ref 0.0–30.0)
Microalb, Ur: <0.7 mg/dL (ref 0.0–1.9)

## 2015-06-01 LAB — LIPID PANEL
CHOL/HDL RATIO: 4
Cholesterol: 197 mg/dL (ref 0–200)
HDL: 48.1 mg/dL (ref >39.00)
LDL CALC: 123 mg/dL — AB (ref 0–99)
NONHDL: 148.61
Triglycerides: 129 mg/dL (ref 0.0–149.0)
VLDL: 25.8 mg/dL (ref 0.0–40.0)

## 2015-06-01 LAB — HEPATIC FUNCTION PANEL
ALT: 16 U/L (ref 0–53)
AST: 21 U/L (ref 0–37)
Albumin: 4.7 g/dL (ref 3.5–5.2)
Alkaline Phosphatase: 60 U/L (ref 39–117)
BILIRUBIN DIRECT: 0.1 mg/dL (ref 0.0–0.3)
BILIRUBIN TOTAL: 0.6 mg/dL (ref 0.2–1.2)
Total Protein: 7.7 g/dL (ref 6.0–8.3)

## 2015-06-01 LAB — HEMOGLOBIN A1C: HEMOGLOBIN A1C: 5.7 % (ref 4.6–6.5)

## 2015-06-01 LAB — BASIC METABOLIC PANEL
BUN: 23 mg/dL (ref 6–23)
CALCIUM: 9.8 mg/dL (ref 8.4–10.5)
CO2: 31 mEq/L (ref 19–32)
Chloride: 103 mEq/L (ref 96–112)
Creatinine, Ser: 1.12 mg/dL (ref 0.40–1.50)
GFR: 69.44 mL/min (ref >60.00)
Glucose, Bld: 113 mg/dL — ABNORMAL HIGH (ref 70–99)
POTASSIUM: 4.7 meq/L (ref 3.5–5.1)
SODIUM: 138 meq/L (ref 135–145)

## 2015-06-01 NOTE — Progress Notes (Signed)
Pre-visit discussion using our clinic review tool. No additional management support is needed unless otherwise documented below in the visit note.  

## 2015-06-01 NOTE — Assessment & Plan Note (Signed)
Sugars controlled with diet adjustment.  Up to date with eye exams.  Continue low carb diet and exercise.  Follow met b and a1c.

## 2015-06-01 NOTE — Progress Notes (Signed)
Patient ID: KALVEN GANIM, male   DOB: [birth year], 69 y.o.   MRN: [id number]   Subjective:    Patient ID: XAIDEN FLEIG, male    DOB: [birth year], 69 y.o.   MRN: [id number]  HPI  Patient with a past history of rheumatoid arthritis, diabetes and hypercholesterolemia.  He comes in today for follow up of these issues.  Stays active.  No cardiac symptoms with increased activity or exertion.  Joints doing well.  Sees Dr [name] yearly.  No sob.  No increased cough or congestion.  No acid reflux.  Bowels doing well.  No urinary issues.   States sugars are doing well.  AM sugars 90 to low 100 range and pm sugars average 100-140.     Past Medical History  Diagnosis Date  . Rheumatoid arthritis   . Diabetes mellitus without complication   . Nephrolithiasis     H/O stones  . Carpal tunnel syndrome    Past Surgical History  Procedure Laterality Date  . Appendectomy  1962  . Carpal tunnel release Bilateral     hands   Family History  Problem Relation Age of Onset  . Heart disease Father   . Hypertension Father   . Diabetes Maternal Grandmother    Social History   Social History  . Marital Status: Married    Spouse Name: N/A  . Number of Children: N/A  . Years of Education: N/A   Social History Main Topics  . Smoking status: Former Research scientist (life sciences)  . Smokeless tobacco: Never Used  . Alcohol Use: No  . Drug Use: No  . Sexual Activity: Not Asked   Other Topics Concern  . None   Social History Narrative    Outpatient Encounter Prescriptions as of 06/01/2015  Medication Sig  . cetirizine (ZYRTEC) 10 MG tablet Take 10 mg by mouth as needed for allergies.  . hydroxychloroquine (PLAQUENIL) 200 MG tablet Take 200 mg by mouth daily.  . ONE TOUCH ULTRA TEST test strip USE TWICE DAILY   No facility-administered encounter medications on file as of 06/01/2015.    Review of Systems  Constitutional: Negative for appetite change and unexpected weight change.  HENT: Negative for congestion and sinus  pressure.   Respiratory: Negative for cough, chest tightness and shortness of breath.   Cardiovascular: Negative for chest pain, palpitations and leg swelling.  Gastrointestinal: Negative for nausea, vomiting, abdominal pain and diarrhea.  Genitourinary: Negative for dysuria and difficulty urinating.  Neurological: Negative for dizziness, light-headedness and headaches.  Psychiatric/Behavioral: Negative for dysphoric mood and agitation.       Objective:    Physical Exam  Constitutional: He appears well-developed and well-nourished. No distress.  HENT:  Nose: Nose normal.  Mouth/Throat: Oropharynx is clear and moist.  Neck: Neck supple. No thyromegaly present.  Cardiovascular: Normal rate and regular rhythm.   Pulmonary/Chest: Effort normal and breath sounds normal. No respiratory distress.  Abdominal: Soft. Bowel sounds are normal. There is no tenderness.  Musculoskeletal: He exhibits no edema or tenderness.  Lymphadenopathy:    He has no cervical adenopathy.  Psychiatric: He has a normal mood and affect. His behavior is normal.    BP 120/80 mmHg  Pulse 69  Temp(Src) 98.1 F (36.7 C) (Oral)  Ht 5' 9" (1.753 m)  Wt 180 lb 8 oz (81.874 kg)  BMI 26.64 kg/m2  SpO2 95% Wt Readings from Last 3 Encounters:  06/01/15 180 lb 8 oz (81.874 kg)  11/29/14 181 lb 6 oz (82.271 kg)  05/28/14 176 lb (79.833 kg)     Lab Results  Component Value Date   WBC 4.6 11/29/2014   HGB 14.4 11/29/2014   HCT 42.6 11/29/2014   PLT 223.0 11/29/2014   GLUCOSE 113* 06/01/2015   CHOL 197 06/01/2015   TRIG 129.0 06/01/2015   HDL 48.10 06/01/2015   LDLCALC 123* 06/01/2015   ALT 16 06/01/2015   AST 21 06/01/2015   NA 138 06/01/2015   K 4.7 06/01/2015   CL 103 06/01/2015   CREATININE 1.12 06/01/2015   BUN 23 06/01/2015   CO2 31 06/01/2015   TSH 0.86 11/29/2014   PSA 1.33 11/29/2014   HGBA1C 5.7 06/01/2015   MICROALBUR <0.7 06/01/2015       Assessment & Plan:   Problem List Items  Addressed This Visit    Diabetes - Primary    Sugars controlled with diet adjustment.  Up to date with eye exams.  Continue low carb diet and exercise.  Follow met b and a1c.        Relevant Orders   Hemoglobin A1c (Completed)   Basic metabolic panel (Completed)   Microalbumin / creatinine urine ratio (Completed)   Hypercholesterolemia    Low cholesterol diet and exercise.  Follow lipid panel.        Relevant Orders   Lipid panel (Completed)   Hepatic function panel (Completed)   Rheumatoid arthritis    Followed by Dr [name].  Stable.            Einar Pheasant, MD

## 2015-06-02 ENCOUNTER — Encounter: Payer: Self-pay | Admitting: *Deleted

## 2015-06-02 ENCOUNTER — Encounter: Payer: Self-pay | Admitting: Internal Medicine

## 2015-06-02 NOTE — Assessment & Plan Note (Signed)
Followed by Dr Kernodle.  Stable.  

## 2015-06-02 NOTE — Assessment & Plan Note (Signed)
Low cholesterol diet and exercise.  Follow lipid panel.   

## 2015-08-10 LAB — HM DIABETES EYE EXAM

## 2015-08-12 ENCOUNTER — Encounter: Payer: Self-pay | Admitting: Internal Medicine

## 2015-12-01 ENCOUNTER — Ambulatory Visit (INDEPENDENT_AMBULATORY_CARE_PROVIDER_SITE_OTHER): Payer: Managed Care, Other (non HMO) | Admitting: Internal Medicine

## 2015-12-01 ENCOUNTER — Encounter: Payer: Self-pay | Admitting: Internal Medicine

## 2015-12-01 VITALS — BP 140/80 | HR 77 | Temp 97.7°F | Resp 18 | Ht 69.0 in | Wt 188.2 lb

## 2015-12-01 DIAGNOSIS — E119 Type 2 diabetes mellitus without complications: Secondary | ICD-10-CM | POA: Diagnosis not present

## 2015-12-01 DIAGNOSIS — M069 Rheumatoid arthritis, unspecified: Secondary | ICD-10-CM

## 2015-12-01 DIAGNOSIS — Z125 Encounter for screening for malignant neoplasm of prostate: Secondary | ICD-10-CM

## 2015-12-01 DIAGNOSIS — N200 Calculus of kidney: Secondary | ICD-10-CM

## 2015-12-01 DIAGNOSIS — E7800 Pure hypercholesterolemia, unspecified: Secondary | ICD-10-CM

## 2015-12-01 DIAGNOSIS — Z0000 Encounter for general adult medical examination without abnormal findings: Secondary | ICD-10-CM

## 2015-12-01 LAB — LIPID PANEL
Cholesterol: 202 mg/dL — ABNORMAL HIGH (ref 0–200)
HDL: 48.6 mg/dL (ref >39.00)
LDL Cholesterol: 127 mg/dL — ABNORMAL HIGH (ref 0–99)
NONHDL: 153.68
TRIGLYCERIDES: 134 mg/dL (ref 0.0–149.0)
Total CHOL/HDL Ratio: 4
VLDL: 26.8 mg/dL (ref 0.0–40.0)

## 2015-12-01 LAB — CBC WITH DIFFERENTIAL/PLATELET
BASOS ABS: 0 10*3/uL (ref 0.0–0.1)
Basophils Relative: 0.8 % (ref 0.0–3.0)
EOS ABS: 0.3 10*3/uL (ref 0.0–0.7)
Eosinophils Relative: 4.9 % (ref 0.0–5.0)
HEMATOCRIT: 40.7 % (ref 39.0–52.0)
HEMOGLOBIN: 14 g/dL (ref 13.0–17.0)
LYMPHS PCT: 18.8 % (ref 12.0–46.0)
Lymphs Abs: 1 10*3/uL (ref 0.7–4.0)
MCHC: 34.5 g/dL (ref 30.0–36.0)
MCV: 97.5 fl (ref 78.0–100.0)
Monocytes Absolute: 0.6 10*3/uL (ref 0.1–1.0)
Monocytes Relative: 11.6 % (ref 3.0–12.0)
NEUTROS ABS: 3.5 10*3/uL (ref 1.4–7.7)
NEUTROS PCT: 63.9 % (ref 43.0–77.0)
PLATELETS: 202 10*3/uL (ref 150.0–400.0)
RBC: 4.17 Mil/uL — AB (ref 4.22–5.81)
RDW: 12.5 % (ref 11.5–15.5)
WBC: 5.4 10*3/uL (ref 4.0–10.5)

## 2015-12-01 LAB — BASIC METABOLIC PANEL
BUN: 16 mg/dL (ref 6–23)
CALCIUM: 9.4 mg/dL (ref 8.4–10.5)
CHLORIDE: 105 meq/L (ref 96–112)
CO2: 29 meq/L (ref 19–32)
CREATININE: 1.1 mg/dL (ref 0.40–1.50)
GFR: 70.79 mL/min (ref >60.00)
GLUCOSE: 105 mg/dL — AB (ref 70–99)
Potassium: 4.5 mEq/L (ref 3.5–5.1)
Sodium: 141 mEq/L (ref 135–145)

## 2015-12-01 LAB — HEPATIC FUNCTION PANEL
ALBUMIN: 4.4 g/dL (ref 3.5–5.2)
ALK PHOS: 52 U/L (ref 39–117)
ALT: 19 U/L (ref 0–53)
AST: 22 U/L (ref 0–37)
BILIRUBIN DIRECT: 0.1 mg/dL (ref 0.0–0.3)
Total Bilirubin: 0.6 mg/dL (ref 0.2–1.2)
Total Protein: 7 g/dL (ref 6.0–8.3)

## 2015-12-01 LAB — HEMOGLOBIN A1C: Hgb A1c MFr Bld: 6.1 % (ref 4.6–6.5)

## 2015-12-01 NOTE — Assessment & Plan Note (Signed)
Followed by Dr [name].  Reviewed his last note.  Remains on daily plaquenil.  Symptoms stable.

## 2015-12-01 NOTE — Assessment & Plan Note (Signed)
Has seen Dr Yves Dill.  No symptoms and no reported problems.

## 2015-12-01 NOTE — Assessment & Plan Note (Signed)
Low cholesterol diet and exercise.  Follow lipid panel.   

## 2015-12-01 NOTE — Progress Notes (Signed)
Pre-visit discussion using our clinic review tool. No additional management support is needed unless otherwise documented below in the visit note.  

## 2015-12-01 NOTE — Assessment & Plan Note (Signed)
Physical today 12/01/15.  Check psa today.  Colonoscopy 02/08/14 - internal hemorrhoids.

## 2015-12-01 NOTE — Progress Notes (Signed)
Patient ID: GARRIT MARROW, male   DOB: [birth year], 70 y.o.   MRN: [id number]   Subjective:    Patient ID: KADARIOUS DIKES, male    DOB: Jul 19, 1946, 70 y.o.   MRN: [id number]  HPI  Patient here for his physical exam.  States he is doing well.  Feels good.  Staying active.  Exercises.  No chest pain or tightness.  No sob.  No cough or congestion.  No acid reflux.  No abdominal pain or cramping.  Bowels stable.  No joint pain.  On plaquenil.  Sees Dr [name].  Not watching is diet as well.  Has gained some weight.     Past Medical History  Diagnosis Date  . Rheumatoid arthritis ([place])   . Diabetes mellitus without complication ([place])   . Nephrolithiasis     H/O stones  . Carpal tunnel syndrome    Past Surgical History  Procedure Laterality Date  . Appendectomy  1962  . Carpal tunnel release Bilateral     hands   Family History  Problem Relation Age of Onset  . Heart disease Father   . Hypertension Father   . Diabetes Maternal Grandmother    Social History   Social History  . Marital Status: Married    Spouse Name: N/A  . Number of Children: N/A  . Years of Education: N/A   Social History Main Topics  . Smoking status: Former Research scientist (life sciences)  . Smokeless tobacco: Never Used  . Alcohol Use: No  . Drug Use: No  . Sexual Activity: Not Asked   Other Topics Concern  . None   Social History Narrative    Outpatient Encounter Prescriptions as of 12/01/2015  Medication Sig  . cetirizine (ZYRTEC) 10 MG tablet Take 10 mg by mouth as needed for allergies.  . hydroxychloroquine (PLAQUENIL) 200 MG tablet Take 200 mg by mouth daily.  . ONE TOUCH ULTRA TEST test strip USE TWICE DAILY   No facility-administered encounter medications on file as of 12/01/2015.    Review of Systems  Constitutional: Negative for appetite change and unexpected weight change.  HENT: Negative for congestion and sinus pressure.   Eyes: Negative for pain and visual disturbance.  Respiratory: Negative for cough, chest  tightness and shortness of breath.   Cardiovascular: Negative for chest pain, palpitations and leg swelling.  Gastrointestinal: Negative for nausea, vomiting, abdominal pain and diarrhea.  Genitourinary: Negative for dysuria and difficulty urinating.  Musculoskeletal: Negative for back pain and joint swelling.  Skin: Negative for color change and rash.  Neurological: Negative for dizziness, light-headedness and headaches.  Hematological: Negative for adenopathy. Does not bruise/bleed easily.  Psychiatric/Behavioral: Negative for dysphoric mood and agitation.       Objective:     Blood pressure rechecked by me:  120/76  Physical Exam  Constitutional: He is oriented to person, place, and time. He appears well-developed and well-nourished. No distress.  HENT:  Head: Normocephalic and atraumatic.  Nose: Nose normal.  Mouth/Throat: Oropharynx is clear and moist. No oropharyngeal exudate.  Eyes: Conjunctivae are normal. Right eye exhibits no discharge. Left eye exhibits no discharge.  Neck: Neck supple. No thyromegaly present.  Cardiovascular: Normal rate and regular rhythm.   Pulmonary/Chest: Breath sounds normal. No respiratory distress. He has no wheezes.  Abdominal: Soft. Bowel sounds are normal. There is no tenderness.  Genitourinary:  Rectal exam - no palpable prostate nodules.  Heme negative.    Musculoskeletal: He exhibits no edema or tenderness.  Lymphadenopathy:  He has no cervical adenopathy.  Neurological: He is alert and oriented to person, place, and time.  Skin: Skin is warm and dry. No rash noted. No erythema.  Psychiatric: He has a normal mood and affect. His behavior is normal.    BP 140/80 mmHg  Pulse 77  Temp(Src) 97.7 F (36.5 C) (Oral)  Resp 18  Ht 5' 9"  (1.753 m)  Wt 188 lb 4 oz (85.39 kg)  BMI 27.79 kg/m2  SpO2 96% Wt Readings from Last 3 Encounters:  12/01/15 188 lb 4 oz (85.39 kg)  06/01/15 180 lb 8 oz (81.874 kg)  11/29/14 181 lb 6 oz (82.271  kg)     Lab Results  Component Value Date   WBC 4.6 11/29/2014   HGB 14.4 11/29/2014   HCT 42.6 11/29/2014   PLT 223.0 11/29/2014   GLUCOSE 113* 06/01/2015   CHOL 197 06/01/2015   TRIG 129.0 06/01/2015   HDL 48.10 06/01/2015   LDLCALC 123* 06/01/2015   ALT 16 06/01/2015   AST 21 06/01/2015   NA 138 06/01/2015   K 4.7 06/01/2015   CL 103 06/01/2015   CREATININE 1.12 06/01/2015   BUN 23 06/01/2015   CO2 31 06/01/2015   TSH 0.86 11/29/2014   PSA 1.33 11/29/2014   HGBA1C 5.7 06/01/2015   MICROALBUR <0.7 06/01/2015        Assessment & Plan:   Problem List Items Addressed This Visit    Diabetes ([place]) - Primary    Has not been watching diet as well.   Is still exercising.  Low carb diet and exercise.  Follow met b and a1c.        Relevant Orders   TSH   Basic metabolic panel   Hemoglobin A1c   Health care maintenance    Physical today 12/01/15.  Check psa today.  Colonoscopy 02/08/14 - internal hemorrhoids.        Hypercholesterolemia    Low cholesterol diet and exercise.  Follow lipid panel.        Relevant Orders   Lipid panel   Nephrolithiasis    Has seen Dr Yves Dill.  No symptoms and no reported problems.        Rheumatoid arthritis ([place])    Followed by Dr [name].  Reviewed his last note.  Remains on daily plaquenil.  Symptoms stable.        Relevant Orders   CBC with Differential/Platelet   Hepatic function panel    Other Visit Diagnoses    Prostate cancer screening        Relevant Orders    PSA, Medicare        Einar Pheasant, MD

## 2015-12-01 NOTE — Assessment & Plan Note (Signed)
Has not been watching diet as well.   Is still exercising.  Low carb diet and exercise.  Follow met b and a1c.

## 2015-12-02 LAB — TSH: TSH: 1.18 u[IU]/mL (ref 0.35–4.50)

## 2015-12-02 LAB — PSA, MEDICARE: PSA: 0.9 ng/mL (ref 0.10–4.00)

## 2015-12-05 ENCOUNTER — Encounter: Payer: Self-pay | Admitting: *Deleted

## 2015-12-07 ENCOUNTER — Telehealth: Payer: Self-pay | Admitting: Internal Medicine

## 2015-12-07 NOTE — Telephone Encounter (Signed)
Left msg to call office to sch AWV with Denisa/msn °

## 2015-12-13 ENCOUNTER — Ambulatory Visit (INDEPENDENT_AMBULATORY_CARE_PROVIDER_SITE_OTHER): Payer: Managed Care, Other (non HMO)

## 2015-12-13 VITALS — BP 118/70 | HR 74 | Temp 98.2°F | Resp 14 | Ht 69.0 in | Wt 185.8 lb

## 2015-12-13 DIAGNOSIS — Z1159 Encounter for screening for other viral diseases: Secondary | ICD-10-CM

## 2015-12-13 DIAGNOSIS — Z0000 Encounter for general adult medical examination without abnormal findings: Secondary | ICD-10-CM | POA: Diagnosis not present

## 2015-12-13 NOTE — Patient Instructions (Addendum)
  Donald [name] , Thank you for taking time to come for your Medicare Wellness Visit. I appreciate your ongoing commitment to your health goals. Please review the following plan we discussed and let me know if I can assist you in the future.   Follow up with Dr. Nicki Reaper as needed.   This is a list of the screening recommended for you and due dates:  Health Maintenance  Topic Date Due  .  Hepatitis C: One time screening is recommended by Center for Disease Control  (CDC) for  adults born from [birth year] through 1963.   31-Oct-1945  . Pneumonia vaccines (1 of 2 - PCV13) 01/28/2013  . Flu Shot  04/10/2016  . Urine Protein Check  05/31/2016  . Hemoglobin A1C  06/02/2016  . Eye exam for diabetics  08/09/2016  . Complete foot exam   11/30/2016  . Tetanus Vaccine  06/19/2021  . Colon Cancer Screening  02/09/2024  . Shingles Vaccine  Addressed

## 2015-12-13 NOTE — Progress Notes (Signed)
Subjective:   Donald [name] is a 70 y.o. male who presents for an Initial Medicare Annual Wellness Visit.  Review of Systems  No ROS.  Medicare Wellness Visit.  Cardiac Risk Factors include: advanced age (>107men, >20 women);diabetes mellitus;male gender    Objective:    Today's Vitals   12/13/15 0927  BP: 118/70  Pulse: 74  Temp: 98.2 F (36.8 C)  TempSrc: Oral  Resp: 14  Height: 5\' 9"  (1.753 m)  Weight: 185 lb 12.8 oz (84.278 kg)  SpO2: 99%   Body mass index is 27.43 kg/(m^2).  Current Medications (verified) Outpatient Encounter Prescriptions as of 12/13/2015  Medication Sig  . cetirizine (ZYRTEC) 10 MG tablet Take 10 mg by mouth as needed for allergies.  . hydroxychloroquine (PLAQUENIL) 200 MG tablet Take 200 mg by mouth daily.  . ONE TOUCH ULTRA TEST test strip USE TWICE DAILY   No facility-administered encounter medications on file as of 12/13/2015.    Allergies (verified) Review of patient's allergies indicates no known allergies.   History: Past Medical History  Diagnosis Date  . Rheumatoid arthritis ([place])   . Diabetes mellitus without complication ([place])   . Nephrolithiasis     H/O stones  . Carpal tunnel syndrome    Past Surgical History  Procedure Laterality Date  . Appendectomy  1962  . Carpal tunnel release Bilateral     hands   Family History  Problem Relation Age of Onset  . Heart disease Father   . Hypertension Father   . Diabetes Maternal Grandmother    Social History   Occupational History  . Not on file.   Social History Main Topics  . Smoking status: Former Research scientist (life sciences)  . Smokeless tobacco: Never Used  . Alcohol Use: No  . Drug Use: No  . Sexual Activity: Not Currently   Tobacco Counseling Counseling given: Not Answered   Activities of Daily Living In your present state of health, do you have any difficulty performing the following activities: 12/13/2015  Hearing? Y  Vision? N  Difficulty concentrating or making decisions? N    Walking or climbing stairs? N  Dressing or bathing? N  Doing errands, shopping? N  Preparing Food and eating ? N  Using the Toilet? N  In the past six months, have you accidently leaked urine? N  Do you have problems with loss of bowel control? N  Managing your Medications? N  Managing your Finances? N  Housekeeping or managing your Housekeeping? N    Immunizations and Health Maintenance Immunization History  Administered Date(s) Administered  . Td 05/14/2011  . Zoster 06/19/2013   Health Maintenance Due  Topic Date Due  . Hepatitis C Screening  1945-11-24  . PNA vac Low Risk Adult (1 of 2 - PCV13) 01/28/2013    Patient Care Team: Einar Pheasant, MD as PCP - General (Internal Medicine)  Indicate any recent Medical Services you may have received from other than [facility] providers in the past year (date may be approximate).    Assessment:   This is a routine wellness examination for Donald [name]. The goal of the wellness visit is to assist the patient how to close the gaps in care and create a preventative care plan for the patient.   Osteoporosis risk reviewed.  Medications reviewed; taking without issues or barriers.  Safety issues reviewed; smoke and carbon monoxide detectors in the home. Firearms locked in a secure area the home.  Wears seatbelts when driving or riding with others. No violence  in the home.  No identified risk were noted; The patient was oriented x 3; appropriate in dress and manner and no objective failures at ADL's or IADL's.   Patient Concerns:  Requests Hep C Screening and PCV 13 vaccine be completed at next visit;  Educational material provided and future order placed. Constant buzzing sound in both ears.  He believes it may be associated with working in a noisy environment for many years.  Deferred auditory referral at this time.  Follow up with PCP as needed.  Hearing/Vision screen Hearing Screening Comments: Passes the whisper test  Vision Screening  Comments: Followed by Dr. Marvel Plan Wears glasses Last OV 06/2015 Annual visits  Dietary issues and exercise activities discussed: Current Exercise Habits: Home exercise routine, Type of exercise: walking;calisthenics, Frequency (Times/Week): 5, Intensity: Moderate  Goals    . Healthy Lifestyle     Drink plenty of fluids and stay hydrated. Low carb foods.  Lean meats, fruits and vegetables. Stay active and continue home exercises and walking.      Depression Screen PHQ 2/9 Scores 12/13/2015 12/01/2015 11/29/2014 11/24/2013  PHQ - 2 Score 0 0 0 0    Fall Risk Fall Risk  12/13/2015 12/01/2015 11/29/2014 11/24/2013 06/03/2013  Falls in the past year? No No Yes No Yes  Number falls in past yr: - - 2 or more - 1  Injury with Fall? - - No - No    Cognitive Function: MMSE - Mini Mental State Exam 12/13/2015  Orientation to time 5  Orientation to Place 5  Registration 3  Attention/ Calculation 5  Recall 3  Language- name 2 objects 2  Language- repeat 1  Language- follow 3 step command 3  Language- read & follow direction 1  Write a sentence 1  Copy design 1  Total score 30    Screening Tests Health Maintenance  Topic Date Due  . Hepatitis C Screening  05-07-1946  . PNA vac Low Risk Adult (1 of 2 - PCV13) 01/28/2013  . INFLUENZA VACCINE  04/10/2016  . URINE MICROALBUMIN  05/31/2016  . HEMOGLOBIN A1C  06/02/2016  . OPHTHALMOLOGY EXAM  08/09/2016  . FOOT EXAM  11/30/2016  . TETANUS/TDAP  06/19/2021  . COLONOSCOPY  02/09/2024  . ZOSTAVAX  Addressed        Plan:   End of life planning; Advance aging; Advanced directives discussed. HCPOA/Living Will educational material deferred at this time.     During the course of the visit Donald [name] was educated and counseled about the following appropriate screening and preventive services:   Vaccines to include Pneumoccal, Influenza, Hepatitis B, Td, Zostavax, HCV  Electrocardiogram  Colorectal cancer screening  Cardiovascular disease  screening  Diabetes screening  Glaucoma screening  Nutrition counseling  Prostate cancer screening  Smoking cessation counseling  Patient Instructions (the written plan) were given to the patient.   Varney Biles, LPN   [id number]    Reviewed above information.  Deferred further evaluation for the buzzing sound.    Dr Nicki Reaper

## 2016-06-04 ENCOUNTER — Encounter: Payer: Self-pay | Admitting: Internal Medicine

## 2016-06-04 ENCOUNTER — Ambulatory Visit (INDEPENDENT_AMBULATORY_CARE_PROVIDER_SITE_OTHER): Payer: Managed Care, Other (non HMO) | Admitting: Internal Medicine

## 2016-06-04 VITALS — BP 120/70 | HR 67 | Temp 97.7°F | Ht 69.0 in | Wt 183.4 lb

## 2016-06-04 DIAGNOSIS — R0989 Other specified symptoms and signs involving the circulatory and respiratory systems: Secondary | ICD-10-CM

## 2016-06-04 DIAGNOSIS — M069 Rheumatoid arthritis, unspecified: Secondary | ICD-10-CM

## 2016-06-04 DIAGNOSIS — Z23 Encounter for immunization: Secondary | ICD-10-CM

## 2016-06-04 DIAGNOSIS — E119 Type 2 diabetes mellitus without complications: Secondary | ICD-10-CM | POA: Diagnosis not present

## 2016-06-04 DIAGNOSIS — E7800 Pure hypercholesterolemia, unspecified: Secondary | ICD-10-CM | POA: Diagnosis not present

## 2016-06-04 LAB — BASIC METABOLIC PANEL
BUN: 24 mg/dL — ABNORMAL HIGH (ref 6–23)
CO2: 28 meq/L (ref 19–32)
Calcium: 8.8 mg/dL (ref 8.4–10.5)
Chloride: 106 mEq/L (ref 96–112)
Creatinine, Ser: 1.05 mg/dL (ref 0.40–1.50)
GFR: 74.58 mL/min (ref >60.00)
GLUCOSE: 107 mg/dL — AB (ref 70–99)
POTASSIUM: 4.7 meq/L (ref 3.5–5.1)
SODIUM: 139 meq/L (ref 135–145)

## 2016-06-04 LAB — HEPATIC FUNCTION PANEL
ALT: 14 U/L (ref 0–53)
AST: 17 U/L (ref 0–37)
Albumin: 4.2 g/dL (ref 3.5–5.2)
Alkaline Phosphatase: 62 U/L (ref 39–117)
BILIRUBIN DIRECT: 0.1 mg/dL (ref 0.0–0.3)
BILIRUBIN TOTAL: 0.4 mg/dL (ref 0.2–1.2)
Total Protein: 7 g/dL (ref 6.0–8.3)

## 2016-06-04 LAB — LIPID PANEL
CHOL/HDL RATIO: 4
Cholesterol: 174 mg/dL (ref 0–200)
HDL: 41.9 mg/dL (ref >39.00)
LDL CALC: 105 mg/dL — AB (ref 0–99)
NONHDL: 131.95
Triglycerides: 136 mg/dL (ref 0.0–149.0)
VLDL: 27.2 mg/dL (ref 0.0–40.0)

## 2016-06-04 LAB — MICROALBUMIN / CREATININE URINE RATIO
CREATININE, U: 127 mg/dL
MICROALB/CREAT RATIO: 0.6 mg/g (ref 0.0–30.0)
Microalb, Ur: <0.7 mg/dL (ref 0.0–1.9)

## 2016-06-04 LAB — HEMOGLOBIN A1C: Hgb A1c MFr Bld: 5.9 % (ref 4.6–6.5)

## 2016-06-04 NOTE — Assessment & Plan Note (Signed)
Followed by Dr [name].  Stable.  On plaquenil.

## 2016-06-04 NOTE — Assessment & Plan Note (Signed)
Low cholesterol diet and exercise.  Check lipid panel.  Desires not to take cholesterol medication.

## 2016-06-04 NOTE — Addendum Note (Signed)
Addended by: Juanda Chance on: 06/04/2016 08:41 AM   Modules accepted: Orders

## 2016-06-04 NOTE — Progress Notes (Signed)
Pre visit review using our clinic review tool, if applicable. No additional management support is needed unless otherwise documented below in the visit note. 

## 2016-06-04 NOTE — Assessment & Plan Note (Signed)
Low carb diet and exercise.  Sugars as outlined.  Check met b and a1c.

## 2016-06-04 NOTE — Assessment & Plan Note (Signed)
Check carotid ultrasound.  

## 2016-06-04 NOTE — Progress Notes (Signed)
Patient ID: Donald [name], male   DOB: May 17, 1946, 70 y.o.   MRN: [id number]   Subjective:    Patient ID: Donald [name], male    DOB: 1946/02/08, 70 y.o.   MRN: [id number]  HPI  Patient here for a scheduled follow up.  Sees Dr [name] for RA.  On plaquenil.  Just evaluated 02/2016.  Stable.  See note.  He is doing well.  Stays active.  No chest pain.  No sob.  No acid reflux reported.  No abdominal pain or cramping.  Bowels stable.  Joints doing well.  States am sugars averaging 112-120 and pm sugars averaging 110-130.  Up to date with eye checks.  Declines flu shot.  Agreeable to pneumonia shot.     Past Medical History:  Diagnosis Date  . Carpal tunnel syndrome   . Diabetes mellitus without complication ([place])   . Nephrolithiasis    H/O stones  . Rheumatoid arthritis [facility])    Past Surgical History:  Procedure Laterality Date  . APPENDECTOMY  1962  . CARPAL TUNNEL RELEASE Bilateral    hands   Family History  Problem Relation Age of Onset  . Heart disease Father   . Hypertension Father   . Diabetes Maternal Grandmother    Social History   Social History  . Marital status: Married    Spouse name: N/A  . Number of children: N/A  . Years of education: N/A   Social History Main Topics  . Smoking status: Former Research scientist (life sciences)  . Smokeless tobacco: Never Used  . Alcohol use No  . Drug use: No  . Sexual activity: Not Currently   Other Topics Concern  . None   Social History Narrative  . None    Outpatient Encounter Prescriptions as of 06/04/2016  Medication Sig  . cetirizine (ZYRTEC) 10 MG tablet Take 10 mg by mouth as needed for allergies.  . hydroxychloroquine (PLAQUENIL) 200 MG tablet Take 200 mg by mouth daily.  . ONE TOUCH ULTRA TEST test strip USE TWICE DAILY   No facility-administered encounter medications on file as of 06/04/2016.     Review of Systems  Constitutional: Negative for appetite change and unexpected weight change.  HENT: Negative for congestion and sinus  pressure.   Respiratory: Negative for cough, chest tightness and shortness of breath.   Cardiovascular: Negative for chest pain, palpitations and leg swelling.  Gastrointestinal: Negative for abdominal pain, diarrhea, nausea and vomiting.  Genitourinary: Negative for difficulty urinating and dysuria.  Musculoskeletal: Negative for back pain and joint swelling.  Skin: Negative for color change and rash.  Neurological: Negative for dizziness, light-headedness and headaches.  Psychiatric/Behavioral: Negative for agitation and dysphoric mood.       Objective:     Blood pressure rechecked by me:  132/78  Physical Exam  Constitutional: He appears well-developed and well-nourished. No distress.  HENT:  Nose: Nose normal.  Mouth/Throat: Oropharynx is clear and moist.  Neck: Neck supple. No thyromegaly present.  Cardiovascular: Normal rate and regular rhythm.   Left carotid bruit.    Pulmonary/Chest: Effort normal and breath sounds normal. No respiratory distress.  Abdominal: Soft. Bowel sounds are normal. There is no tenderness.  Musculoskeletal: He exhibits no edema or tenderness.  Lymphadenopathy:    He has no cervical adenopathy.  Skin: No rash noted. No erythema.  Psychiatric: He has a normal mood and affect. His behavior is normal.    BP 120/70   Pulse 67   Temp 97.7 F (36.5 C) (  Oral)   Ht 5' 9"  (1.753 m)   Wt 183 lb 6.4 oz (83.2 kg)   SpO2 94%   BMI 27.08 kg/m  Wt Readings from Last 3 Encounters:  06/04/16 183 lb 6.4 oz (83.2 kg)  12/13/15 185 lb 12.8 oz (84.3 kg)  12/01/15 188 lb 4 oz (85.4 kg)     Lab Results  Component Value Date   WBC 5.4 12/01/2015   HGB 14.0 12/01/2015   HCT 40.7 12/01/2015   PLT 202.0 12/01/2015   GLUCOSE 105 (H) 12/01/2015   CHOL 202 (H) 12/01/2015   TRIG 134.0 12/01/2015   HDL 48.60 12/01/2015   LDLCALC 127 (H) 12/01/2015   ALT 19 12/01/2015   AST 22 12/01/2015   NA 141 12/01/2015   K 4.5 12/01/2015   CL 105 12/01/2015    CREATININE 1.10 12/01/2015   BUN 16 12/01/2015   CO2 29 12/01/2015   TSH 1.18 12/01/2015   PSA 0.90 12/01/2015   HGBA1C 6.1 12/01/2015   MICROALBUR <0.7 06/01/2015       Assessment & Plan:   Problem List Items Addressed This Visit    Diabetes ([place])    Low carb diet and exercise.  Sugars as outlined.  Check met b and a1c.        Relevant Orders   Hemoglobin Q5Z   Basic metabolic panel   Microalbumin / creatinine urine ratio   Hypercholesterolemia    Low cholesterol diet and exercise.  Check lipid panel.  Desires not to take cholesterol medication.       Relevant Orders   Lipid panel   Hepatic function panel   Left carotid bruit    Check carotid ultrasound.        Relevant Orders   Ambulatory referral to Vascular Surgery   Rheumatoid arthritis ([place])    Followed by Dr [name].  Stable.  On plaquenil.         Other Visit Diagnoses   None.      Einar Pheasant, MD

## 2016-12-03 DIAGNOSIS — Z6826 Body mass index (BMI) 26.0-26.9, adult: Secondary | ICD-10-CM | POA: Diagnosis not present

## 2016-12-03 DIAGNOSIS — M1990 Unspecified osteoarthritis, unspecified site: Secondary | ICD-10-CM | POA: Diagnosis not present

## 2016-12-03 DIAGNOSIS — Z87891 Personal history of nicotine dependence: Secondary | ICD-10-CM | POA: Diagnosis not present

## 2016-12-03 DIAGNOSIS — M069 Rheumatoid arthritis, unspecified: Secondary | ICD-10-CM | POA: Diagnosis not present

## 2016-12-03 DIAGNOSIS — E663 Overweight: Secondary | ICD-10-CM | POA: Diagnosis not present

## 2016-12-03 DIAGNOSIS — E785 Hyperlipidemia, unspecified: Secondary | ICD-10-CM | POA: Diagnosis not present

## 2016-12-03 DIAGNOSIS — E119 Type 2 diabetes mellitus without complications: Secondary | ICD-10-CM | POA: Diagnosis not present

## 2016-12-04 ENCOUNTER — Ambulatory Visit (INDEPENDENT_AMBULATORY_CARE_PROVIDER_SITE_OTHER): Payer: Medicare HMO | Admitting: Internal Medicine

## 2016-12-04 ENCOUNTER — Ambulatory Visit (INDEPENDENT_AMBULATORY_CARE_PROVIDER_SITE_OTHER): Payer: Medicare HMO

## 2016-12-04 ENCOUNTER — Encounter: Payer: Self-pay | Admitting: Internal Medicine

## 2016-12-04 VITALS — BP 118/68 | HR 75 | Temp 98.6°F | Resp 14 | Ht 69.5 in | Wt 185.1 lb

## 2016-12-04 DIAGNOSIS — E7800 Pure hypercholesterolemia, unspecified: Secondary | ICD-10-CM | POA: Diagnosis not present

## 2016-12-04 DIAGNOSIS — R059 Cough, unspecified: Secondary | ICD-10-CM

## 2016-12-04 DIAGNOSIS — R0989 Other specified symptoms and signs involving the circulatory and respiratory systems: Secondary | ICD-10-CM | POA: Diagnosis not present

## 2016-12-04 DIAGNOSIS — R051 Acute cough: Secondary | ICD-10-CM | POA: Insufficient documentation

## 2016-12-04 DIAGNOSIS — M069 Rheumatoid arthritis, unspecified: Secondary | ICD-10-CM | POA: Diagnosis not present

## 2016-12-04 DIAGNOSIS — R05 Cough: Secondary | ICD-10-CM

## 2016-12-04 DIAGNOSIS — Z125 Encounter for screening for malignant neoplasm of prostate: Secondary | ICD-10-CM | POA: Diagnosis not present

## 2016-12-04 DIAGNOSIS — E119 Type 2 diabetes mellitus without complications: Secondary | ICD-10-CM | POA: Diagnosis not present

## 2016-12-04 DIAGNOSIS — Z0000 Encounter for general adult medical examination without abnormal findings: Secondary | ICD-10-CM | POA: Diagnosis not present

## 2016-12-04 LAB — BASIC METABOLIC PANEL
BUN: 18 mg/dL (ref 6–23)
CO2: 28 mEq/L (ref 19–32)
Calcium: 9.5 mg/dL (ref 8.4–10.5)
Chloride: 104 mEq/L (ref 96–112)
Creatinine, Ser: 1.11 mg/dL (ref 0.40–1.50)
GFR: 69.84 mL/min (ref >60.00)
Glucose, Bld: 114 mg/dL — ABNORMAL HIGH (ref 70–99)
POTASSIUM: 4.8 meq/L (ref 3.5–5.1)
SODIUM: 139 meq/L (ref 135–145)

## 2016-12-04 LAB — CBC WITH DIFFERENTIAL/PLATELET
Basophils Absolute: 0.1 10*3/uL (ref 0.0–0.1)
Basophils Relative: 1.2 % (ref 0.0–3.0)
Eosinophils Absolute: 0.2 10*3/uL (ref 0.0–0.7)
Eosinophils Relative: 4.2 % (ref 0.0–5.0)
HCT: 41.2 % (ref 39.0–52.0)
Hemoglobin: 14.5 g/dL (ref 13.0–17.0)
Lymphocytes Relative: 24.4 % (ref 12.0–46.0)
Lymphs Abs: 1.2 10*3/uL (ref 0.7–4.0)
MCHC: 35.3 g/dL (ref 30.0–36.0)
MCV: 99.7 fl (ref 78.0–100.0)
Monocytes Absolute: 0.7 10*3/uL (ref 0.1–1.0)
Monocytes Relative: 13.8 % — ABNORMAL HIGH (ref 3.0–12.0)
Neutro Abs: 2.8 10*3/uL (ref 1.4–7.7)
Neutrophils Relative %: 56.4 % (ref 43.0–77.0)
Platelets: 238 10*3/uL (ref 150.0–400.0)
RBC: 4.13 Mil/uL — ABNORMAL LOW (ref 4.22–5.81)
RDW: 13.3 % (ref 11.5–15.5)
WBC: 4.9 10*3/uL (ref 4.0–10.5)

## 2016-12-04 LAB — LIPID PANEL
Cholesterol: 205 mg/dL — ABNORMAL HIGH (ref 0–200)
HDL: 42.6 mg/dL (ref >39.00)
LDL Cholesterol: 137 mg/dL — ABNORMAL HIGH (ref 0–99)
NonHDL: 162.04
Total CHOL/HDL Ratio: 5
Triglycerides: 127 mg/dL (ref 0.0–149.0)
VLDL: 25.4 mg/dL (ref 0.0–40.0)

## 2016-12-04 LAB — HEPATIC FUNCTION PANEL
ALT: 16 U/L (ref 0–53)
AST: 18 U/L (ref 0–37)
Albumin: 4.7 g/dL (ref 3.5–5.2)
Alkaline Phosphatase: 52 U/L (ref 39–117)
Bilirubin, Direct: 0.1 mg/dL (ref 0.0–0.3)
Total Bilirubin: 0.6 mg/dL (ref 0.2–1.2)
Total Protein: 7.1 g/dL (ref 6.0–8.3)

## 2016-12-04 LAB — PSA, MEDICARE: PSA: 0.98 ng/ml (ref 0.10–4.00)

## 2016-12-04 LAB — TSH: TSH: 1.02 u[IU]/mL (ref 0.35–4.50)

## 2016-12-04 LAB — HEMOGLOBIN A1C: HEMOGLOBIN A1C: 6.1 % (ref 4.6–6.5)

## 2016-12-04 NOTE — Progress Notes (Signed)
Pre-visit discussion using our clinic review tool. No additional management support is needed unless otherwise documented below in the visit note.  

## 2016-12-04 NOTE — Patient Instructions (Signed)
nasacort nasal spray - 2 sprays each nostril one time per day.  Do this in the evening.    Zantac (ranitidine) 150mg  - take one tablet 30 minutes before breakfast

## 2016-12-04 NOTE — Progress Notes (Signed)
Patient ID: Donald [name], male   DOB: 1946-02-09, 71 y.o.   MRN: [id number]   Subjective:    Patient ID: Donald [name], male    DOB: [birth year], 71 y.o.   MRN: [id number]  HPI  Patient here for his physical exam.  Sees Dr [name] for RA.  On plaquenil.  Last evaluated 02/2016.  Stable.  Reports increased nasal congestion.  Intermittent.  No sore throat now.  Some increased cough.  No chest congestion.  No sob.  No acid reflux.  No nausea or vomiting.  Bowels moving.  States am blood sugars averaging 119-120 and pm sugars 120-140.  Overall he feels he is doing relatively well.     Past Medical History:  Diagnosis Date  . Carpal tunnel syndrome   . Diabetes mellitus without complication ([place])   . Nephrolithiasis    H/O stones  . Rheumatoid arthritis [facility])    Past Surgical History:  Procedure Laterality Date  . APPENDECTOMY  1962  . CARPAL TUNNEL RELEASE Bilateral    hands   Family History  Problem Relation Age of Onset  . Heart disease Father   . Hypertension Father   . Diabetes Maternal Grandmother    Social History   Social History  . Marital status: Married    Spouse name: N/A  . Number of children: N/A  . Years of education: N/A   Social History Main Topics  . Smoking status: Former Research scientist (life sciences)  . Smokeless tobacco: Never Used  . Alcohol use No  . Drug use: No  . Sexual activity: Not Currently   Other Topics Concern  . None   Social History Narrative  . None    Outpatient Encounter Prescriptions as of 12/04/2016  Medication Sig  . cetirizine (ZYRTEC) 10 MG tablet Take 10 mg by mouth as needed for allergies.  . hydroxychloroquine (PLAQUENIL) 200 MG tablet Take 200 mg by mouth daily.  . ONE TOUCH ULTRA TEST test strip USE TWICE DAILY   No facility-administered encounter medications on file as of 12/04/2016.     Review of Systems  Constitutional: Negative for appetite change and unexpected weight change.  HENT: Positive for congestion and postnasal drip. Negative  for sinus pressure and sore throat.   Eyes: Negative for pain and visual disturbance.  Respiratory: Positive for cough. Negative for chest tightness and shortness of breath.   Cardiovascular: Negative for chest pain, palpitations and leg swelling.  Gastrointestinal: Negative for abdominal pain, diarrhea, nausea and vomiting.  Genitourinary: Negative for difficulty urinating and dysuria.  Musculoskeletal: Negative for back pain and joint swelling.  Skin: Negative for color change and rash.  Neurological: Negative for dizziness, light-headedness and headaches.  Hematological: Negative for adenopathy. Does not bruise/bleed easily.  Psychiatric/Behavioral: Negative for agitation and dysphoric mood.       Objective:     Blood pressure rechecked by me:  120/78  Physical Exam  Constitutional: He is oriented to person, place, and time. He appears well-developed and well-nourished. No distress.  HENT:  Head: Normocephalic and atraumatic.  Nose: Nose normal.  Mouth/Throat: Oropharynx is clear and moist. No oropharyngeal exudate.  Eyes: Conjunctivae are normal. Right eye exhibits no discharge. Left eye exhibits no discharge.  Neck: Neck supple. No thyromegaly present.  Cardiovascular: Normal rate and regular rhythm.   Pulmonary/Chest: Effort normal and breath sounds normal. No respiratory distress. He has no wheezes.  Abdominal: Soft. Bowel sounds are normal. There is no tenderness.  Genitourinary:  Genitourinary Comments: Rectal exam -  no palpable prostate nodules.  Heme negative.   Musculoskeletal: He exhibits no edema or tenderness.  Lymphadenopathy:    He has no cervical adenopathy.  Neurological: He is alert and oriented to person, place, and time.  Skin: Skin is warm and dry. No rash noted. No erythema.  Psychiatric: He has a normal mood and affect. His behavior is normal.    BP 118/68 (BP Location: Left Arm, Patient Position: Sitting, Cuff Size: Normal)   Pulse 75   Temp 98.6 F  (37 C) (Oral)   Resp 14   Ht 5' 9.5" (1.765 m)   Wt 185 lb 1.9 oz (84 kg)   SpO2 98%   BMI 26.95 kg/m  Wt Readings from Last 3 Encounters:  12/04/16 185 lb 1.9 oz (84 kg)  06/04/16 183 lb 6.4 oz (83.2 kg)  12/13/15 185 lb 12.8 oz (84.3 kg)     Lab Results  Component Value Date   WBC 4.9 12/04/2016   HGB 14.5 12/04/2016   HCT 41.2 12/04/2016   PLT 238.0 12/04/2016   GLUCOSE 114 (H) 12/04/2016   CHOL 205 (H) 12/04/2016   TRIG 127.0 12/04/2016   HDL 42.60 12/04/2016   LDLCALC 137 (H) 12/04/2016   ALT 16 12/04/2016   AST 18 12/04/2016   NA 139 12/04/2016   K 4.8 12/04/2016   CL 104 12/04/2016   CREATININE 1.11 12/04/2016   BUN 18 12/04/2016   CO2 28 12/04/2016   TSH 1.02 12/04/2016   PSA 0.98 12/04/2016   HGBA1C 6.1 12/04/2016   MICROALBUR <0.7 06/04/2016       Assessment & Plan:   Problem List Items Addressed This Visit    Cough    Persistent cough with increased nasal congestion, etc.  nasacort nasal spray and saline nasal spray as directed.  Robitussin.  Follow.  Check cxr.  No significant acid reflux reported.  Will give a trial of zantac.  Get him back in soon to reassess.        Relevant Orders   DG Chest 2 View (Completed)   Diabetes ([place]) - Primary    Blood sugars as outlined.  Low carb diet and exercise.  Follow met b and a1c.  Discussed the need for eye exam.        Relevant Orders   Hemoglobin A1c (Completed)   Basic metabolic panel (Completed)   Health care maintenance    Physical today 12/04/16.  Check psa.  Colonoscopy 02/08/14 - internal hemorrhoids.        Hypercholesterolemia    Low cholesterol diet and exercise.  Follow  Lipid panel.        Relevant Orders   CBC with Differential/Platelet (Completed)   TSH (Completed)   Lipid panel (Completed)   Hepatic function panel (Completed)   Rheumatoid arthritis ([place])    On plaquenil.  Stable.  Followed by Dr [name].  Discussed the need for regular eye exams.         Other Visit Diagnoses     Prostate cancer screening       Relevant Orders   PSA, Medicare (Completed)       Einar Pheasant, MD

## 2016-12-05 ENCOUNTER — Telehealth: Payer: Self-pay

## 2016-12-05 NOTE — Telephone Encounter (Signed)
-----   Message from Einar Pheasant, MD sent at 12/05/2016  4:02 AM EDT ----- Notify pt that his cxr is ok.

## 2016-12-05 NOTE — Telephone Encounter (Signed)
Called patient mailbox full.

## 2016-12-05 NOTE — Telephone Encounter (Signed)
Patient informed no questions  

## 2016-12-06 ENCOUNTER — Telehealth: Payer: Self-pay

## 2016-12-06 DIAGNOSIS — Z1159 Encounter for screening for other viral diseases: Secondary | ICD-10-CM

## 2016-12-06 NOTE — Telephone Encounter (Signed)
-----   Message from Einar Pheasant, MD sent at 12/06/2016  5:26 AM EDT ----- Notify pt that his cholesterol has increased when compared to the previous check.  I recommend a low cholesterol diet and exercise.  We will follow.  Overall sugar control ok.  a1c 6.1.  hgb , thyroid test and liver function tests wnl.  psa wnl.

## 2016-12-06 NOTE — Telephone Encounter (Signed)
Order placed for hepatitis C antibody.

## 2016-12-06 NOTE — Telephone Encounter (Signed)
Left message to return call to our office.  

## 2016-12-06 NOTE — Telephone Encounter (Signed)
Patient informed had app here on 12-12-16 so made app with lab to have Hep c checked. Can you put order in?

## 2016-12-08 ENCOUNTER — Encounter: Payer: Self-pay | Admitting: Internal Medicine

## 2016-12-08 NOTE — Assessment & Plan Note (Signed)
Low cholesterol diet and exercise.  Follow  Lipid panel.   

## 2016-12-08 NOTE — Assessment & Plan Note (Signed)
Persistent cough with increased nasal congestion, etc.  nasacort nasal spray and saline nasal spray as directed.  Robitussin.  Follow.  Check cxr.  No significant acid reflux reported.  Will give a trial of zantac.  Get him back in soon to reassess.

## 2016-12-08 NOTE — Assessment & Plan Note (Signed)
On plaquenil.  Stable.  Followed by Dr [name].  Discussed the need for regular eye exams.

## 2016-12-08 NOTE — Assessment & Plan Note (Signed)
Physical today 12/04/16.  Check psa.  Colonoscopy 02/08/14 - internal hemorrhoids.

## 2016-12-08 NOTE — Assessment & Plan Note (Signed)
Blood sugars as outlined.  Low carb diet and exercise.  Follow met b and a1c.  Discussed the need for eye exam.

## 2016-12-12 ENCOUNTER — Other Ambulatory Visit (INDEPENDENT_AMBULATORY_CARE_PROVIDER_SITE_OTHER): Payer: Medicare HMO

## 2016-12-12 ENCOUNTER — Ambulatory Visit (INDEPENDENT_AMBULATORY_CARE_PROVIDER_SITE_OTHER): Payer: Medicare HMO

## 2016-12-12 VITALS — BP 138/78 | HR 93 | Temp 98.0°F | Resp 14 | Ht 69.5 in | Wt 185.1 lb

## 2016-12-12 DIAGNOSIS — Z0000 Encounter for general adult medical examination without abnormal findings: Secondary | ICD-10-CM | POA: Diagnosis not present

## 2016-12-12 DIAGNOSIS — Z1159 Encounter for screening for other viral diseases: Secondary | ICD-10-CM | POA: Diagnosis not present

## 2016-12-12 NOTE — Patient Instructions (Addendum)
  [name] , Thank you for taking time to come for your Medicare Wellness Visit. I appreciate your ongoing commitment to your health goals. Please review the following plan we discussed and let me know if I can assist you in the future.   Follow up with Dr. Nicki Reaper as needed.    Bring a copy of your [place] and/or Living Will to be scanned into chart.  Have a great day!  These are the goals we discussed: Goals    . Healthy Lifestyle          Stay hydrated and drink plenty of fluids/water.   Low carb foods.  Lean meats, fruits and vegetables. Stay active and continue home exercises and walking.       This is a list of the screening recommended for you and due dates:  Health Maintenance  Topic Date Due  .  Hepatitis C: One time screening is recommended by Center for Disease Control  (CDC) for  adults born from [birth year] through 1963.   03-09-1946  . Eye exam for diabetics  08/09/2016  . Complete foot exam   11/30/2016  . Flu Shot  06/04/2017*  . Urine Protein Check  06/04/2017  . Pneumonia vaccines (2 of 2 - PPSV23) 06/04/2017  . Hemoglobin A1C  06/06/2017  . Tetanus Vaccine  06/19/2021  . Colon Cancer Screening  02/09/2024  *Topic was postponed. The date shown is not the original due date.

## 2016-12-12 NOTE — Progress Notes (Signed)
Subjective:   Donald [name] is a 71 y.o. male who presents for Medicare Annual/Subsequent preventive examination.  Review of Systems:  No ROS.  Medicare Wellness Visit.  Cardiac Risk Factors include: advanced age (>43men, >42 women);male gender;hypertension;diabetes mellitus     Objective:    Vitals: BP 138/78 (BP Location: Left Arm, Patient Position: Sitting, Cuff Size: Normal)   Pulse 93   Temp 98 F (36.7 C) (Oral)   Resp 14   Ht 5' 9.5" (1.765 m)   Wt 185 lb 1.9 oz (84 kg)   SpO2 97%   BMI 26.95 kg/m   Body mass index is 26.95 kg/m.  Tobacco History  Smoking Status  . Former Smoker  Smokeless Tobacco  . Never Used     Counseling given: Not Answered   Past Medical History:  Diagnosis Date  . Carpal tunnel syndrome   . Diabetes mellitus without complication ([place])   . Nephrolithiasis    H/O stones  . Rheumatoid arthritis [facility])    Past Surgical History:  Procedure Laterality Date  . APPENDECTOMY  1962  . CARPAL TUNNEL RELEASE Bilateral    hands   Family History  Problem Relation Age of Onset  . Heart disease Father   . Hypertension Father   . Diabetes Maternal Grandmother    History  Sexual Activity  . Sexual activity: Not Currently    Outpatient Encounter Prescriptions as of 12/12/2016  Medication Sig  . cetirizine (ZYRTEC) 10 MG tablet Take 10 mg by mouth as needed for allergies.  . hydroxychloroquine (PLAQUENIL) 200 MG tablet Take 200 mg by mouth daily.  . ONE TOUCH ULTRA TEST test strip USE TWICE DAILY   No facility-administered encounter medications on file as of 12/12/2016.     Activities of Daily Living In your present state of health, do you have any difficulty performing the following activities: 12/12/2016 12/13/2015  Hearing? N Y  Vision? N N  Difficulty concentrating or making decisions? N N  Walking or climbing stairs? N N  Dressing or bathing? N N  Doing errands, shopping? N N  Preparing Food and eating ? N N  Using the Toilet? N N    In the past six months, have you accidently leaked urine? N N  Do you have problems with loss of bowel control? N N  Managing your Medications? N N  Managing your Finances? N N  Housekeeping or managing your Housekeeping? N N  Some recent data might be hidden    Patient Care Team: Einar Pheasant, MD as PCP - General (Internal Medicine)   Assessment:    This is a routine wellness examination for Donald [name]. The goal of the wellness visit is to assist the patient how to close the gaps in care and create a preventative care plan for the patient.   Osteoporosis risk reviewed.  Medications reviewed; taking without issues or barriers.  Safety issues reviewed; smoke detectors in the home. Firearms locked up in the home. Wears seatbelts when driving or riding with others. Patient does wear sunscreen or protective clothing when in direct sunlight. No violence in the home.  Patient is alert, normal appearance, oriented to person/place/and time. Correctly identified the president of the [place], recall of 2/3 words, and performing simple calculations.  Patient displays appropriate judgement and can read correct time from watch face.  No new identified risk were noted.  No failures at ADL's or IADL's.   BMI- discussed the importance of a healthy diet, water intake and  exercise. Educational material provided.   Dental- every six months.    Eye- Visual acuity not assessed per patient preference since they have regular follow up with the ophthalmologist.  Wears corrective lenses.  Sleep patterns- Sleeps 6 hours at night.  Wakes feeling rested.  Patient Concerns: None at this time. Follow up with PCP as needed.  Exercise Activities and Dietary recommendations Current Exercise Habits: Home exercise routine, Type of exercise: walking, Time (Minutes): 20, Frequency (Times/Week): 2, Weekly Exercise (Minutes/Week): 40, Intensity: Moderate  Goals    . Healthy Lifestyle          Stay hydrated  and drink plenty of fluids/water.   Low carb foods.  Lean meats, fruits and vegetables. Stay active and continue home exercises and walking.      Fall Risk Fall Risk  12/12/2016 06/04/2016 12/13/2015 12/01/2015 11/29/2014  Falls in the past year? No No No No Yes  Number falls in past yr: - - - - 2 or more  Injury with Fall? - - - - No   Depression Screen PHQ 2/9 Scores 12/12/2016 06/04/2016 12/13/2015 12/01/2015  PHQ - 2 Score 0 0 0 0  Exception Documentation - Patient refusal - -    Cognitive Function MMSE - Mini Mental State Exam 12/12/2016 12/13/2015  Orientation to time 5 5  Orientation to Place 5 5  Registration 3 3  Attention/ Calculation 5 5  Recall 3 3  Language- name 2 objects 2 2  Language- repeat 1 1  Language- follow 3 step command 3 3  Language- read & follow direction 1 1  Write a sentence 1 1  Copy design 1 1  Total score 30 30        Immunization History  Administered Date(s) Administered  . Pneumococcal Conjugate-13 06/04/2016  . Td 05/14/2011  . Zoster 06/19/2013   Screening Tests Health Maintenance  Topic Date Due  . Hepatitis C Screening  [birth year]  . OPHTHALMOLOGY EXAM  08/09/2016  . FOOT EXAM  11/30/2016  . INFLUENZA VACCINE  06/04/2017 (Originally 04/10/2017)  . URINE MICROALBUMIN  06/04/2017  . PNA vac Low Risk Adult (2 of 2 - PPSV23) 06/04/2017  . HEMOGLOBIN A1C  06/06/2017  . TETANUS/TDAP  06/19/2021  . COLONOSCOPY  02/09/2024      Plan:   End of life planning; Advanced aging; Advanced directives discussed.  No HCPOA/Living Will.  Additional information provided to help them start the conversation with family.  Copy of HCPOA/Living Will requested upon completion. Time spent on this topic is 18 minutes.  Medicare Attestation I have personally reviewed: The patient's medical and social history Their use of alcohol, tobacco or illicit drugs Their current medications and supplements The patient's functional ability including ADLs,fall risks, home  safety risks, cognitive, and hearing and visual impairment Diet and physical activities Evidence for depression   The patient's weight, height, BMI, and visual acuity have been recorded in the chart.  I have made referrals and provided education to the patient based on review of the above and I have provided the patient with a written personalized care plan for preventive services.    During the course of the visit the patient was educated and counseled about the following appropriate screening and preventive services:   Vaccines to include Pneumoccal, Influenza, Hepatitis B, Td, Zostavax, HCV  Colorectal cancer screening-UTD  Diabetes -followed by PCP  Prostate Cancer Screening-most recent 12/04/16, wnl  Glaucoma screening-annual eye exam  Nutrition counseling   Patient Instructions (the written plan)  was given to the patient.    Varney Biles, LPN  09/15/6058   Reviewed above information.  Agree with plan.   Dr Nicki Reaper

## 2016-12-13 LAB — HEPATITIS C ANTIBODY: HCV AB: NEGATIVE

## 2017-01-18 ENCOUNTER — Ambulatory Visit (INDEPENDENT_AMBULATORY_CARE_PROVIDER_SITE_OTHER): Payer: Medicare HMO | Admitting: Internal Medicine

## 2017-01-18 ENCOUNTER — Encounter: Payer: Self-pay | Admitting: Internal Medicine

## 2017-01-18 VITALS — BP 110/74 | HR 80 | Temp 97.6°F | Resp 17 | Ht 69.5 in | Wt 186.6 lb

## 2017-01-18 DIAGNOSIS — G2581 Restless legs syndrome: Secondary | ICD-10-CM

## 2017-01-18 DIAGNOSIS — E119 Type 2 diabetes mellitus without complications: Secondary | ICD-10-CM

## 2017-01-18 DIAGNOSIS — E7800 Pure hypercholesterolemia, unspecified: Secondary | ICD-10-CM | POA: Diagnosis not present

## 2017-01-18 DIAGNOSIS — R011 Cardiac murmur, unspecified: Secondary | ICD-10-CM | POA: Diagnosis not present

## 2017-01-18 DIAGNOSIS — M069 Rheumatoid arthritis, unspecified: Secondary | ICD-10-CM

## 2017-01-18 LAB — CBC WITH DIFFERENTIAL/PLATELET
Basophils Absolute: 0.1 10*3/uL (ref 0.0–0.1)
Basophils Relative: 1.2 % (ref 0.0–3.0)
EOS PCT: 3.5 % (ref 0.0–5.0)
Eosinophils Absolute: 0.2 10*3/uL (ref 0.0–0.7)
HCT: 40.6 % (ref 39.0–52.0)
HEMOGLOBIN: 14.1 g/dL (ref 13.0–17.0)
Lymphocytes Relative: 20.2 % (ref 12.0–46.0)
Lymphs Abs: 1.1 10*3/uL (ref 0.7–4.0)
MCHC: 34.6 g/dL (ref 30.0–36.0)
MCV: 98.7 fl (ref 78.0–100.0)
MONOS PCT: 10.8 % (ref 3.0–12.0)
Monocytes Absolute: 0.6 10*3/uL (ref 0.1–1.0)
Neutro Abs: 3.7 10*3/uL (ref 1.4–7.7)
Neutrophils Relative %: 64.3 % (ref 43.0–77.0)
Platelets: 207 10*3/uL (ref 150.0–400.0)
RBC: 4.12 Mil/uL — AB (ref 4.22–5.81)
RDW: 12.5 % (ref 11.5–15.5)
WBC: 5.7 10*3/uL (ref 4.0–10.5)

## 2017-01-18 LAB — FERRITIN: FERRITIN: 194.5 ng/mL (ref 22.0–322.0)

## 2017-01-18 MED ORDER — GLUCOSE BLOOD VI STRP: ORAL_STRIP | 3 refills | Status: DC

## 2017-01-18 MED ORDER — PRAMIPEXOLE DIHYDROCHLORIDE 0.125 MG PO TABS: ORAL_TABLET | ORAL | 1 refills | Status: DC

## 2017-01-18 NOTE — Progress Notes (Signed)
Patient ID: Donald [name], male   DOB: 1945/12/28, 71 y.o.   MRN: [id number]   Subjective:    Patient ID: Donald [name], male    DOB: [birth year], 71 y.o.   MRN: [id number]  HPI  Patient here for a scheduled follow up.  He reports he is feeling good.  His main complaint is that of restless legs.  Has been bothered with this for a while, but feels is getting worse.  When he goes to lie down at night, his legs "ache".  Hard to keep them still.  Constantly moving until he falls asleep.  States his wife has commented on the movement.  No pain with ambulation.  No chest pain. No sob.  No acid reflux.  No abdominal pain.  Bowels moving.  Sugars he reports are doing well.  Mostly averaging 120s.  Watches his diet.     Past Medical History:  Diagnosis Date  . Carpal tunnel syndrome   . Diabetes mellitus without complication ([place])   . Nephrolithiasis    H/O stones  . Rheumatoid arthritis [facility])    Past Surgical History:  Procedure Laterality Date  . APPENDECTOMY  1962  . CARPAL TUNNEL RELEASE Bilateral    hands   Family History  Problem Relation Age of Onset  . Heart disease Father   . Hypertension Father   . Diabetes Maternal Grandmother    Social History   Social History  . Marital status: Married    Spouse name: N/A  . Number of children: N/A  . Years of education: N/A   Social History Main Topics  . Smoking status: Former Research scientist (life sciences)  . Smokeless tobacco: Never Used  . Alcohol use No  . Drug use: No  . Sexual activity: Not Currently   Other Topics Concern  . None   Social History Narrative  . None    Outpatient Encounter Prescriptions as of 01/18/2017  Medication Sig  . cetirizine (ZYRTEC) 10 MG tablet Take 10 mg by mouth as needed for allergies.  Marland Kitchen glucose blood (ONE TOUCH ULTRA TEST) test strip USE TWICE DAILY  . hydroxychloroquine (PLAQUENIL) 200 MG tablet Take 200 mg by mouth daily.  . [DISCONTINUED] ONE TOUCH ULTRA TEST test strip USE TWICE DAILY  . pramipexole (MIRAPEX)  0.125 MG tablet Take one tablet before bed   No facility-administered encounter medications on file as of 01/18/2017.     Review of Systems  Constitutional: Negative for appetite change and unexpected weight change.  HENT: Negative for congestion and sinus pressure.   Respiratory: Negative for cough, chest tightness and shortness of breath.   Cardiovascular: Negative for chest pain, palpitations and leg swelling.  Gastrointestinal: Negative for abdominal pain, diarrhea, nausea and vomiting.  Genitourinary: Negative for difficulty urinating and dysuria.  Musculoskeletal: Negative for back pain and joint swelling.       Restless legs as outlined.    Neurological: Negative for dizziness, light-headedness and headaches.  Psychiatric/Behavioral: Negative for agitation and dysphoric mood.       Objective:     Blood pressure rechecked by me:  134/78  Physical Exam  Constitutional: He appears well-developed and well-nourished. No distress.  HENT:  Nose: Nose normal.  Mouth/Throat: Oropharynx is clear and moist.  Neck: Neck supple. No thyromegaly present.  Cardiovascular: Normal rate and regular rhythm.   9-7/0 systolic murmur.    Pulmonary/Chest: Effort normal and breath sounds normal. No respiratory distress.  Abdominal: Soft. Bowel sounds are normal. There is no tenderness.  Musculoskeletal: He exhibits no edema or tenderness.  DP pulses palpable and equal bilaterally.   Lymphadenopathy:    He has no cervical adenopathy.  Skin: No rash noted. No erythema.  Psychiatric: He has a normal mood and affect. His behavior is normal.    BP 110/74 (BP Location: Left Arm, Patient Position: Sitting, Cuff Size: Normal)   Pulse 80   Temp 97.6 F (36.4 C) (Oral)   Resp 17   Ht 5' 9.5" (1.765 m)   Wt 186 lb 9.6 oz (84.6 kg)   SpO2 98%   BMI 27.16 kg/m  Wt Readings from Last 3 Encounters:  01/18/17 186 lb 9.6 oz (84.6 kg)  12/12/16 185 lb 1.9 oz (84 kg)  12/04/16 185 lb 1.9 oz (84 kg)       Lab Results  Component Value Date   WBC 5.7 01/18/2017   HGB 14.1 01/18/2017   HCT 40.6 01/18/2017   PLT 207.0 01/18/2017   GLUCOSE 114 (H) 12/04/2016   CHOL 205 (H) 12/04/2016   TRIG 127.0 12/04/2016   HDL 42.60 12/04/2016   LDLCALC 137 (H) 12/04/2016   ALT 16 12/04/2016   AST 18 12/04/2016   NA 139 12/04/2016   K 4.8 12/04/2016   CL 104 12/04/2016   CREATININE 1.11 12/04/2016   BUN 18 12/04/2016   CO2 28 12/04/2016   TSH 1.02 12/04/2016   PSA 0.98 12/04/2016   HGBA1C 6.1 12/04/2016   MICROALBUR <0.7 06/04/2016       Assessment & Plan:   Problem List Items Addressed This Visit    Diabetes ([place])    Low carb diet and exercise.  Follow met b and a1c.   Lab Results  Component Value Date   HGBA1C 6.1 12/04/2016        Relevant Orders   Hemoglobin N1B   Basic metabolic panel   Hypercholesterolemia    Low cholesterol diet and exercise.  Follow lipid panel.  Discussed cholesterol medication.  Prefers to continue with diet and exercise.        Relevant Orders   Hepatic function panel   Lipid panel   Newly recognized murmur    Heart murmur as outlined.  Check echo.        Relevant Orders   ECHOCARDIOGRAM COMPLETE   Restless legs    Restless legs as outlined.  Worsened.  Check cbc and iron studies.  Treat with mirapex.  Titrate as needed.  Update me on how he is doing.        Rheumatoid arthritis ([facility])    Sees Dr [name].  Stable.         Other Visit Diagnoses    Restless leg syndrome    -  Primary   Relevant Orders   CBC with Differential/Platelet (Completed)   Ferritin (Completed)       Einar Pheasant, MD

## 2017-01-19 DIAGNOSIS — I350 Nonrheumatic aortic (valve) stenosis: Secondary | ICD-10-CM | POA: Insufficient documentation

## 2017-01-19 NOTE — Assessment & Plan Note (Signed)
Low carb diet and exercise.  Follow met b and a1c.   Lab Results  Component Value Date   HGBA1C 6.1 12/04/2016

## 2017-01-19 NOTE — Assessment & Plan Note (Signed)
Sees Dr [name].  Stable.

## 2017-01-19 NOTE — Assessment & Plan Note (Signed)
Restless legs as outlined.  Worsened.  Check cbc and iron studies.  Treat with mirapex.  Titrate as needed.  Update me on how he is doing.

## 2017-01-19 NOTE — Assessment & Plan Note (Signed)
Low cholesterol diet and exercise.  Follow lipid panel.  Discussed cholesterol medication.  Prefers to continue with diet and exercise.

## 2017-01-19 NOTE — Assessment & Plan Note (Signed)
Heart murmur as outlined.  Check echo.

## 2017-01-29 ENCOUNTER — Telehealth: Payer: Self-pay | Admitting: Internal Medicine

## 2017-01-29 NOTE — Telephone Encounter (Signed)
Ok to refill mirapex x 4.    We may need to write rx for meter and test strips.  Ok to 1 year on test strips.

## 2017-01-29 NOTE — Telephone Encounter (Signed)
Ok to refill 

## 2017-01-29 NOTE — Telephone Encounter (Signed)
Doug a Occupational psychologist called requesting refill for pt for pramipexole (MIRAPEX) 0.125 MG tablet, Accu Check Nano smart view meter, and accu check nano smart view test strips. Please asdvise, thank you!

## 2017-01-31 NOTE — Telephone Encounter (Signed)
Done

## 2017-02-21 DIAGNOSIS — E119 Type 2 diabetes mellitus without complications: Secondary | ICD-10-CM | POA: Diagnosis not present

## 2017-02-21 DIAGNOSIS — M0579 Rheumatoid arthritis with rheumatoid factor of multiple sites without organ or systems involvement: Secondary | ICD-10-CM | POA: Diagnosis not present

## 2017-03-01 ENCOUNTER — Other Ambulatory Visit: Payer: Self-pay

## 2017-03-01 ENCOUNTER — Ambulatory Visit (INDEPENDENT_AMBULATORY_CARE_PROVIDER_SITE_OTHER): Payer: Medicare HMO

## 2017-03-01 DIAGNOSIS — R011 Cardiac murmur, unspecified: Secondary | ICD-10-CM | POA: Diagnosis not present

## 2017-03-04 NOTE — Progress Notes (Signed)
6/24 lmtrc

## 2017-03-21 ENCOUNTER — Telehealth: Payer: Self-pay | Admitting: Internal Medicine

## 2017-03-21 NOTE — Telephone Encounter (Signed)
Called patient states that he called mail order to get new strips and they told him that they do not carry that one any longer and he needed all new information for new tester and strips he is not sure what kind they want him to change to. I will need to call insurance and find out.

## 2017-03-21 NOTE — Telephone Encounter (Signed)
Pt lvm stating that he needs a prescription sent to [facility] for diabetic testing supplies. He wants someone to call him so he can explain what is going on. Pt cb [phone]

## 2017-03-25 ENCOUNTER — Other Ambulatory Visit: Payer: Self-pay

## 2017-03-25 NOTE — Telephone Encounter (Signed)
Called patient informed all information but during call his phone d/c I tried to call back but not able l/m will call back at later time to make sure information received.

## 2017-03-25 NOTE — Telephone Encounter (Signed)
Patient called back information given.

## 2017-03-25 NOTE — Telephone Encounter (Signed)
Called pharmacy changed all information to new brand of supplies accu-check [employer]. I need to call pt and let him know

## 2017-04-26 ENCOUNTER — Other Ambulatory Visit (INDEPENDENT_AMBULATORY_CARE_PROVIDER_SITE_OTHER): Payer: Medicare HMO

## 2017-04-26 DIAGNOSIS — E7800 Pure hypercholesterolemia, unspecified: Secondary | ICD-10-CM

## 2017-04-26 DIAGNOSIS — E119 Type 2 diabetes mellitus without complications: Secondary | ICD-10-CM

## 2017-04-26 LAB — HEPATIC FUNCTION PANEL
ALK PHOS: 47 U/L (ref 39–117)
ALT: 16 U/L (ref 0–53)
AST: 22 U/L (ref 0–37)
Albumin: 4.1 g/dL (ref 3.5–5.2)
BILIRUBIN TOTAL: 0.7 mg/dL (ref 0.2–1.2)
Bilirubin, Direct: 0.1 mg/dL (ref 0.0–0.3)
Total Protein: 6.5 g/dL (ref 6.0–8.3)

## 2017-04-26 LAB — LIPID PANEL
Cholesterol: 171 mg/dL (ref 0–200)
HDL: 40.5 mg/dL (ref >39.00)
LDL Cholesterol: 105 mg/dL — ABNORMAL HIGH (ref 0–99)
NonHDL: 130.08
Total CHOL/HDL Ratio: 4
Triglycerides: 127 mg/dL (ref 0.0–149.0)
VLDL: 25.4 mg/dL (ref 0.0–40.0)

## 2017-04-26 LAB — BASIC METABOLIC PANEL
BUN: 23 mg/dL (ref 6–23)
CHLORIDE: 105 meq/L (ref 96–112)
CO2: 27 mEq/L (ref 19–32)
Calcium: 9.2 mg/dL (ref 8.4–10.5)
Creatinine, Ser: 1.2 mg/dL (ref 0.40–1.50)
GFR: 63.76 mL/min (ref >60.00)
Glucose, Bld: 114 mg/dL — ABNORMAL HIGH (ref 70–99)
POTASSIUM: 4.7 meq/L (ref 3.5–5.1)
SODIUM: 139 meq/L (ref 135–145)

## 2017-04-26 LAB — HEMOGLOBIN A1C: Hgb A1c MFr Bld: 6 % (ref 4.6–6.5)

## 2017-04-29 ENCOUNTER — Telehealth: Payer: Self-pay | Admitting: Internal Medicine

## 2017-04-29 NOTE — Telephone Encounter (Signed)
Pt left message on office vm. He would like Joellen to call him.

## 2017-04-29 NOTE — Telephone Encounter (Signed)
Spoke to patient see lab results.

## 2017-04-30 ENCOUNTER — Ambulatory Visit: Payer: Medicare HMO | Admitting: Internal Medicine

## 2017-05-24 ENCOUNTER — Ambulatory Visit (INDEPENDENT_AMBULATORY_CARE_PROVIDER_SITE_OTHER): Payer: Medicare HMO | Admitting: Internal Medicine

## 2017-05-24 ENCOUNTER — Ambulatory Visit (INDEPENDENT_AMBULATORY_CARE_PROVIDER_SITE_OTHER): Payer: Medicare HMO

## 2017-05-24 ENCOUNTER — Encounter: Payer: Self-pay | Admitting: Internal Medicine

## 2017-05-24 VITALS — BP 140/80 | HR 81 | Temp 97.9°F | Resp 14 | Ht 70.0 in | Wt 188.4 lb

## 2017-05-24 DIAGNOSIS — M79672 Pain in left foot: Secondary | ICD-10-CM

## 2017-05-24 DIAGNOSIS — M069 Rheumatoid arthritis, unspecified: Secondary | ICD-10-CM

## 2017-05-24 DIAGNOSIS — I350 Nonrheumatic aortic (valve) stenosis: Secondary | ICD-10-CM | POA: Diagnosis not present

## 2017-05-24 DIAGNOSIS — E7800 Pure hypercholesterolemia, unspecified: Secondary | ICD-10-CM | POA: Diagnosis not present

## 2017-05-24 DIAGNOSIS — G2581 Restless legs syndrome: Secondary | ICD-10-CM | POA: Diagnosis not present

## 2017-05-24 DIAGNOSIS — E119 Type 2 diabetes mellitus without complications: Secondary | ICD-10-CM

## 2017-05-24 MED ORDER — PRAMIPEXOLE DIHYDROCHLORIDE 0.25 MG PO TABS: 0.2500 mg | ORAL_TABLET | Freq: Every day | ORAL | 1 refills | Status: DC

## 2017-05-24 NOTE — Progress Notes (Signed)
Patient ID: Donald [name], male   DOB: [birth year], 71 y.o.   MRN: [id number]   Subjective:    Patient ID: Donald [name], male    DOB: 02/14/1946, 71 y.o.   MRN: [id number]  HPI  Patient here for a scheduled follow up.  He reports he is doing well. Feels good.  Stays active.  No chest pain.  No sob.  No acid reflux.  No abdominal pain.  Bowels moving.  Still with restless legs.  Tried mirapex.  Did not feel helped.  Discussed increasing the dose.  Discussed labs.  He is having pain in his left heel.  Has been present for weeks.  No known injury or trauma.     Past Medical History:  Diagnosis Date  . Carpal tunnel syndrome   . Diabetes mellitus without complication ([place])   . Nephrolithiasis    H/O stones  . Rheumatoid arthritis [facility])    Past Surgical History:  Procedure Laterality Date  . APPENDECTOMY  1962  . CARPAL TUNNEL RELEASE Bilateral    hands   Family History  Problem Relation Age of Onset  . Heart disease Father   . Hypertension Father   . Diabetes Maternal Grandmother    Social History   Social History  . Marital status: Married    Spouse name: N/A  . Number of children: N/A  . Years of education: N/A   Social History Main Topics  . Smoking status: Former Research scientist (life sciences)  . Smokeless tobacco: Never Used  . Alcohol use No  . Drug use: No  . Sexual activity: Not Currently   Other Topics Concern  . None   Social History Narrative  . None    Outpatient Encounter Prescriptions as of 05/24/2017  Medication Sig  . cetirizine (ZYRTEC) 10 MG tablet Take 10 mg by mouth as needed for allergies.  . hydroxychloroquine (PLAQUENIL) 200 MG tablet Take 200 mg by mouth daily.  . [DISCONTINUED] pramipexole (MIRAPEX) 0.125 MG tablet Take one tablet before bed  . pramipexole (MIRAPEX) 0.25 MG tablet Take 1 tablet (0.25 mg total) by mouth daily. Take in the pm   No facility-administered encounter medications on file as of 05/24/2017.     Review of Systems  Constitutional: Negative  for appetite change and unexpected weight change.  HENT: Negative for congestion and sinus pressure.   Respiratory: Negative for cough, chest tightness and shortness of breath.   Cardiovascular: Negative for chest pain, palpitations and leg swelling.  Gastrointestinal: Negative for abdominal pain, diarrhea, nausea and vomiting.  Genitourinary: Negative for difficulty urinating and dysuria.  Musculoskeletal: Negative for back pain and joint swelling.       Left heel pain as outlined.    Skin: Negative for color change and rash.  Neurological: Negative for dizziness, light-headedness and headaches.  Psychiatric/Behavioral: Negative for agitation and dysphoric mood.       Objective:    Physical Exam  Constitutional: He appears well-developed and well-nourished. No distress.  HENT:  Nose: Nose normal.  Mouth/Throat: Oropharynx is clear and moist.  Neck: Neck supple. No thyromegaly present.  Cardiovascular: Normal rate and regular rhythm.   Pulmonary/Chest: Effort normal and breath sounds normal. No respiratory distress.  Abdominal: Soft. Bowel sounds are normal. There is no tenderness.  Musculoskeletal: He exhibits no edema or tenderness.  No pain to palpation - left heel.    Lymphadenopathy:    He has no cervical adenopathy.  Skin: No rash noted. No erythema.  Psychiatric: He has a  normal mood and affect. His behavior is normal.    BP 140/80 (BP Location: Left Arm, Patient Position: Sitting, Cuff Size: Normal)   Pulse 81   Temp 97.9 F (36.6 C) (Oral)   Resp 14   Ht 5' 10" (1.778 m)   Wt 188 lb 6.4 oz (85.5 kg)   SpO2 98%   BMI 27.03 kg/m  Wt Readings from Last 3 Encounters:  05/24/17 188 lb 6.4 oz (85.5 kg)  01/18/17 186 lb 9.6 oz (84.6 kg)  12/12/16 185 lb 1.9 oz (84 kg)     Lab Results  Component Value Date   WBC 5.7 01/18/2017   HGB 14.1 01/18/2017   HCT 40.6 01/18/2017   PLT 207.0 01/18/2017   GLUCOSE 114 (H) 04/26/2017   CHOL 171 04/26/2017   TRIG 127.0  04/26/2017   HDL 40.50 04/26/2017   LDLCALC 105 (H) 04/26/2017   ALT 16 04/26/2017   AST 22 04/26/2017   NA 139 04/26/2017   K 4.7 04/26/2017   CL 105 04/26/2017   CREATININE 1.20 04/26/2017   BUN 23 04/26/2017   CO2 27 04/26/2017   TSH 1.02 12/04/2016   PSA 0.98 12/04/2016   HGBA1C 6.0 04/26/2017   MICROALBUR <0.7 06/04/2016       Assessment & Plan:   Problem List Items Addressed This Visit    Aortic stenosis    Mild AS noted on echo.  Currently asymptomatic.  Follow.        Diabetes ([place])    Low carb diet and exercise.  Follow met b and a1c.   Lab Results  Component Value Date   HGBA1C 6.0 04/26/2017        Relevant Orders   Hemoglobin T6A   Basic metabolic panel   Hypercholesterolemia    Low cholesterol diet and exercise.  Follow lipid panel.        Relevant Orders   Hepatic function panel   Lipid panel   Restless legs    Will try increased dose of mirapex.  Recent hgb and iron stores wnl.       Rheumatoid arthritis ([place])    Followed by Dr [name].  Stable.  On plaquenil.  Discussed the need for eye exam.         Other Visit Diagnoses    Left foot pain    -  Primary   pain in left foot/heel.  perssitent.  check xray.  may need podiatry referral.     Relevant Orders   DG Foot 2 Views Left (Completed)   Pain of left heel       Relevant Orders   DG Foot 2 Views Left (Completed)       Einar Pheasant, MD

## 2017-05-25 ENCOUNTER — Encounter: Payer: Self-pay | Admitting: Internal Medicine

## 2017-05-25 NOTE — Assessment & Plan Note (Signed)
Low cholesterol diet and exercise.  Follow lipid panel.   

## 2017-05-25 NOTE — Assessment & Plan Note (Signed)
Low carb diet and exercise.  Follow met b and a1c.   Lab Results  Component Value Date   HGBA1C 6.0 04/26/2017

## 2017-05-25 NOTE — Assessment & Plan Note (Signed)
Mild AS noted on echo.  Currently asymptomatic.  Follow.

## 2017-05-25 NOTE — Assessment & Plan Note (Signed)
Will try increased dose of mirapex.  Recent hgb and iron stores wnl.

## 2017-05-25 NOTE — Assessment & Plan Note (Signed)
Followed by Dr [name].  Stable.  On plaquenil.  Discussed the need for eye exam.

## 2017-08-27 DIAGNOSIS — H2513 Age-related nuclear cataract, bilateral: Secondary | ICD-10-CM | POA: Diagnosis not present

## 2017-08-27 DIAGNOSIS — H5203 Hypermetropia, bilateral: Secondary | ICD-10-CM | POA: Diagnosis not present

## 2017-08-27 DIAGNOSIS — H25013 Cortical age-related cataract, bilateral: Secondary | ICD-10-CM | POA: Diagnosis not present

## 2017-08-27 DIAGNOSIS — E119 Type 2 diabetes mellitus without complications: Secondary | ICD-10-CM | POA: Diagnosis not present

## 2017-08-27 LAB — HM DIABETES EYE EXAM

## 2017-09-06 DIAGNOSIS — Z79899 Other long term (current) drug therapy: Secondary | ICD-10-CM | POA: Diagnosis not present

## 2017-09-11 ENCOUNTER — Encounter: Payer: Self-pay | Admitting: Internal Medicine

## 2017-09-24 ENCOUNTER — Other Ambulatory Visit (INDEPENDENT_AMBULATORY_CARE_PROVIDER_SITE_OTHER): Payer: Medicare HMO

## 2017-09-24 DIAGNOSIS — E119 Type 2 diabetes mellitus without complications: Secondary | ICD-10-CM | POA: Diagnosis not present

## 2017-09-24 DIAGNOSIS — E7800 Pure hypercholesterolemia, unspecified: Secondary | ICD-10-CM

## 2017-09-24 LAB — HEMOGLOBIN A1C: Hgb A1c MFr Bld: 6.2 % (ref 4.6–6.5)

## 2017-09-24 LAB — LIPID PANEL
CHOL/HDL RATIO: 5
Cholesterol: 194 mg/dL (ref 0–200)
HDL: 42.9 mg/dL (ref >39.00)
LDL CALC: 111 mg/dL — AB (ref 0–99)
NonHDL: 150.87
TRIGLYCERIDES: 199 mg/dL — AB (ref 0.0–149.0)
VLDL: 39.8 mg/dL (ref 0.0–40.0)

## 2017-09-24 LAB — HEPATIC FUNCTION PANEL
ALBUMIN: 4.6 g/dL (ref 3.5–5.2)
ALT: 21 U/L (ref 0–53)
AST: 18 U/L (ref 0–37)
Alkaline Phosphatase: 61 U/L (ref 39–117)
Bilirubin, Direct: 0.1 mg/dL (ref 0.0–0.3)
TOTAL PROTEIN: 7.4 g/dL (ref 6.0–8.3)
Total Bilirubin: 0.5 mg/dL (ref 0.2–1.2)

## 2017-09-24 LAB — BASIC METABOLIC PANEL
BUN: 22 mg/dL (ref 6–23)
CHLORIDE: 104 meq/L (ref 96–112)
CO2: 30 meq/L (ref 19–32)
Calcium: 9.3 mg/dL (ref 8.4–10.5)
Creatinine, Ser: 1.19 mg/dL (ref 0.40–1.50)
GFR: 64.3 mL/min (ref >60.00)
Glucose, Bld: 126 mg/dL — ABNORMAL HIGH (ref 70–99)
Potassium: 5.1 mEq/L (ref 3.5–5.1)
SODIUM: 138 meq/L (ref 135–145)

## 2017-09-27 ENCOUNTER — Encounter: Payer: Self-pay | Admitting: Internal Medicine

## 2017-09-27 ENCOUNTER — Ambulatory Visit (INDEPENDENT_AMBULATORY_CARE_PROVIDER_SITE_OTHER): Payer: Medicare HMO | Admitting: Internal Medicine

## 2017-09-27 DIAGNOSIS — G2581 Restless legs syndrome: Secondary | ICD-10-CM | POA: Diagnosis not present

## 2017-09-27 DIAGNOSIS — M069 Rheumatoid arthritis, unspecified: Secondary | ICD-10-CM

## 2017-09-27 DIAGNOSIS — E7800 Pure hypercholesterolemia, unspecified: Secondary | ICD-10-CM

## 2017-09-27 DIAGNOSIS — E119 Type 2 diabetes mellitus without complications: Secondary | ICD-10-CM | POA: Diagnosis not present

## 2017-09-27 DIAGNOSIS — I350 Nonrheumatic aortic (valve) stenosis: Secondary | ICD-10-CM | POA: Diagnosis not present

## 2017-09-27 DIAGNOSIS — L989 Disorder of the skin and subcutaneous tissue, unspecified: Secondary | ICD-10-CM | POA: Diagnosis not present

## 2017-09-27 MED ORDER — ROSUVASTATIN CALCIUM 5 MG PO TABS: 5.0000 mg | ORAL_TABLET | Freq: Every day | ORAL | 2 refills | Status: DC

## 2017-09-27 NOTE — Progress Notes (Addendum)
Patient ID: Donald [name], male   DOB: 02/11/1946, 72 y.o.   MRN: [id number]   Subjective:    Patient ID: Donald [name], male    DOB: 12-14-1945, 72 y.o.   MRN: [id number]  HPI  Patient here for a scheduled follow up.  States he is doing well.  Feels good.  Staying active.  No chest pain.  No sob. No acid reflux.  No abdominal pain.  Bowels moving.  Some persistent problems with restless legs.  mirapex did not help.  He stopped.  Not affecting his sleep.  Discussed his lab results.  Discussed cholesterol and calculated risk.  Discussed starting cholesterol medication.  He was in agreement.  Persistent lesion - left cheek.     Past Medical History:  Diagnosis Date  . Carpal tunnel syndrome   . Diabetes mellitus without complication ([place])   . Nephrolithiasis    H/O stones  . Rheumatoid arthritis [facility])    Past Surgical History:  Procedure Laterality Date  . APPENDECTOMY  1962  . CARPAL TUNNEL RELEASE Bilateral    hands   Family History  Problem Relation Age of Onset  . Heart disease Father   . Hypertension Father   . Diabetes Maternal Grandmother    Social History   Socioeconomic History  . Marital status: Married    Spouse name: None  . Number of children: None  . Years of education: None  . Highest education level: None  Social Needs  . Financial resource strain: None  . Food insecurity - worry: None  . Food insecurity - inability: None  . Transportation needs - medical: None  . Transportation needs - non-medical: None  Occupational History  . None  Tobacco Use  . Smoking status: Former Research scientist (life sciences)  . Smokeless tobacco: Never Used  Substance and Sexual Activity  . Alcohol use: No    Alcohol/week: 0.0 oz  . Drug use: No  . Sexual activity: Not Currently  Other Topics Concern  . None  Social History Narrative  . None    Outpatient Encounter Medications as of 09/27/2017  Medication Sig  . glucose blood (ONE TOUCH ULTRA TEST) test strip USE TO TEXT BLOOD SUGAR SUGAR  TWICE A DAY  . cetirizine (ZYRTEC) 10 MG tablet Take 10 mg by mouth as needed for allergies.  . hydroxychloroquine (PLAQUENIL) 200 MG tablet Take 200 mg by mouth daily.  . pramipexole (MIRAPEX) 0.25 MG tablet Take 1 tablet (0.25 mg total) by mouth daily. Take in the pm (Patient not taking: Reported on 09/27/2017)  . rosuvastatin (CRESTOR) 5 MG tablet Take 1 tablet (5 mg total) by mouth daily.   No facility-administered encounter medications on file as of 09/27/2017.     Review of Systems  Constitutional: Negative for appetite change and unexpected weight change.  HENT: Negative for congestion and sinus pressure.   Respiratory: Negative for cough, chest tightness and shortness of breath.   Cardiovascular: Negative for chest pain, palpitations and leg swelling.  Gastrointestinal: Negative for abdominal pain, diarrhea, nausea and vomiting.  Genitourinary: Negative for difficulty urinating and dysuria.  Musculoskeletal: Negative for joint swelling and myalgias.       Restless legs as outlined.  Not affecting sleep.   Skin: Negative for color change and rash.  Neurological: Negative for dizziness, light-headedness and headaches.  Psychiatric/Behavioral: Negative for agitation and dysphoric mood.       Objective:    Physical Exam  Constitutional: He appears well-developed and well-nourished. No distress.  HENT:  Nose: Nose normal.  Mouth/Throat: Oropharynx is clear and moist.  Neck: Neck supple. No thyromegaly present.  Cardiovascular: Normal rate and regular rhythm.  Pulmonary/Chest: Effort normal and breath sounds normal. No respiratory distress.  Abdominal: Soft. Bowel sounds are normal. There is no tenderness.  Musculoskeletal: He exhibits no edema or tenderness.  Lymphadenopathy:    He has no cervical adenopathy.  Skin: No rash noted. No erythema.  Psychiatric: He has a normal mood and affect. His behavior is normal.    BP 124/72 (BP Location: Left Arm, Patient Position:  Sitting, Cuff Size: Normal)   Pulse 76   Temp 97.8 F (36.6 C) (Oral)   Resp 16   Wt 194 lb 12.8 oz (88.4 kg)   SpO2 96%   BMI 27.95 kg/m  Wt Readings from Last 3 Encounters:  09/27/17 194 lb 12.8 oz (88.4 kg)  05/24/17 188 lb 6.4 oz (85.5 kg)  01/18/17 186 lb 9.6 oz (84.6 kg)     Lab Results  Component Value Date   WBC 5.7 01/18/2017   HGB 14.1 01/18/2017   HCT 40.6 01/18/2017   PLT 207.0 01/18/2017   GLUCOSE 126 (H) 09/24/2017   CHOL 194 09/24/2017   TRIG 199.0 (H) 09/24/2017   HDL 42.90 09/24/2017   LDLCALC 111 (H) 09/24/2017   ALT 21 09/24/2017   AST 18 09/24/2017   NA 138 09/24/2017   K 5.1 09/24/2017   CL 104 09/24/2017   CREATININE 1.19 09/24/2017   BUN 22 09/24/2017   CO2 30 09/24/2017   TSH 1.02 12/04/2016   PSA 0.98 12/04/2016   HGBA1C 6.2 09/24/2017   MICROALBUR <0.7 06/04/2016        Assessment & Plan:   Problem List Items Addressed This Visit    Aortic stenosis    Mild AS noted on echo.  Currently asymptomatic.  Follow.        Relevant Medications   rosuvastatin (CRESTOR) 5 MG tablet   Diabetes ([facility])    Low carb diet and exercise.  Follow met b and a1c.   Lab Results  Component Value Date   HGBA1C 6.2 09/24/2017        Relevant Medications   rosuvastatin (CRESTOR) 5 MG tablet   Hypercholesterolemia    Low cholesterol diet and exercise.  Follow lipid panel and liver function tests. Start crestor.        Relevant Medications   rosuvastatin (CRESTOR) 5 MG tablet   Other Relevant Orders   Hepatic function panel   Restless legs    Was on mirapex.  Stopped because he did not feel helped.  Discussed further treatment.  Will hold on starting since starting cholesterol medication.  He will notify me if tolerates crestor and if desires another medication.        Rheumatoid arthritis ([place])    Followed by Dr [name]. On plaquenil.  Stable.       Skin lesion of cheek    Persistent lesion of cheek.  Refer to dermatology.         Relevant Orders   Ambulatory referral to Dermatology       Einar Pheasant, MD

## 2017-09-29 DIAGNOSIS — L989 Disorder of the skin and subcutaneous tissue, unspecified: Secondary | ICD-10-CM | POA: Insufficient documentation

## 2017-09-29 NOTE — Assessment & Plan Note (Signed)
Persistent lesion of cheek.  Refer to dermatology.

## 2017-09-29 NOTE — Assessment & Plan Note (Signed)
Mild AS noted on echo.  Currently asymptomatic.  Follow.

## 2017-09-29 NOTE — Addendum Note (Signed)
Addended by: Alisa Graff on: 09/29/2017 04:06 PM   Modules accepted: Orders

## 2017-09-29 NOTE — Assessment & Plan Note (Signed)
Followed by Dr [name]. On plaquenil.  Stable.

## 2017-09-29 NOTE — Assessment & Plan Note (Signed)
Low carb diet and exercise.  Follow met b and a1c.   Lab Results  Component Value Date   HGBA1C 6.2 09/24/2017

## 2017-09-29 NOTE — Assessment & Plan Note (Signed)
Low cholesterol diet and exercise.  Follow lipid panel and liver function tests. Start crestor.

## 2017-09-29 NOTE — Assessment & Plan Note (Signed)
Was on mirapex.  Stopped because he did not feel helped.  Discussed further treatment.  Will hold on starting since starting cholesterol medication.  He will notify me if tolerates crestor and if desires another medication.

## 2017-10-29 ENCOUNTER — Other Ambulatory Visit: Payer: Self-pay

## 2017-10-29 DIAGNOSIS — L03032 Cellulitis of left toe: Secondary | ICD-10-CM | POA: Diagnosis not present

## 2017-11-12 ENCOUNTER — Other Ambulatory Visit: Payer: Self-pay | Admitting: Internal Medicine

## 2017-11-12 ENCOUNTER — Other Ambulatory Visit (INDEPENDENT_AMBULATORY_CARE_PROVIDER_SITE_OTHER): Payer: Medicare HMO

## 2017-11-12 DIAGNOSIS — M069 Rheumatoid arthritis, unspecified: Secondary | ICD-10-CM

## 2017-11-12 DIAGNOSIS — E7800 Pure hypercholesterolemia, unspecified: Secondary | ICD-10-CM

## 2017-11-12 DIAGNOSIS — E119 Type 2 diabetes mellitus without complications: Secondary | ICD-10-CM

## 2017-11-12 DIAGNOSIS — L03032 Cellulitis of left toe: Secondary | ICD-10-CM | POA: Diagnosis not present

## 2017-11-12 DIAGNOSIS — Z125 Encounter for screening for malignant neoplasm of prostate: Secondary | ICD-10-CM

## 2017-11-12 LAB — HEPATIC FUNCTION PANEL
ALT: 21 U/L (ref 0–53)
AST: 21 U/L (ref 0–37)
Albumin: 4.3 g/dL (ref 3.5–5.2)
Alkaline Phosphatase: 55 U/L (ref 39–117)
BILIRUBIN DIRECT: 0.1 mg/dL (ref 0.0–0.3)
BILIRUBIN TOTAL: 0.6 mg/dL (ref 0.2–1.2)
Total Protein: 7.1 g/dL (ref 6.0–8.3)

## 2017-11-13 DIAGNOSIS — D2261 Melanocytic nevi of right upper limb, including shoulder: Secondary | ICD-10-CM | POA: Diagnosis not present

## 2017-11-13 DIAGNOSIS — L821 Other seborrheic keratosis: Secondary | ICD-10-CM | POA: Diagnosis not present

## 2017-11-13 DIAGNOSIS — C44519 Basal cell carcinoma of skin of other part of trunk: Secondary | ICD-10-CM | POA: Diagnosis not present

## 2017-11-13 DIAGNOSIS — D044 Carcinoma in situ of skin of scalp and neck: Secondary | ICD-10-CM | POA: Diagnosis not present

## 2017-11-13 DIAGNOSIS — D2272 Melanocytic nevi of left lower limb, including hip: Secondary | ICD-10-CM | POA: Diagnosis not present

## 2017-11-13 DIAGNOSIS — C44619 Basal cell carcinoma of skin of left upper limb, including shoulder: Secondary | ICD-10-CM | POA: Diagnosis not present

## 2017-11-13 DIAGNOSIS — D485 Neoplasm of uncertain behavior of skin: Secondary | ICD-10-CM | POA: Diagnosis not present

## 2017-11-13 DIAGNOSIS — X32XXXA Exposure to sunlight, initial encounter: Secondary | ICD-10-CM | POA: Diagnosis not present

## 2017-11-13 DIAGNOSIS — L570 Actinic keratosis: Secondary | ICD-10-CM | POA: Diagnosis not present

## 2017-11-13 DIAGNOSIS — D225 Melanocytic nevi of trunk: Secondary | ICD-10-CM | POA: Diagnosis not present

## 2017-11-24 IMAGING — DX DG FOOT 2V*L*
2 series · 2 of 2 positions shown · non-contrast
Comparison: None.

CLINICAL DATA: 71-year-old male with left foot pain for 1 month. No
recent trauma or surgery.

EXAM:
LEFT FOOT - 2 VIEW

[foot ap]
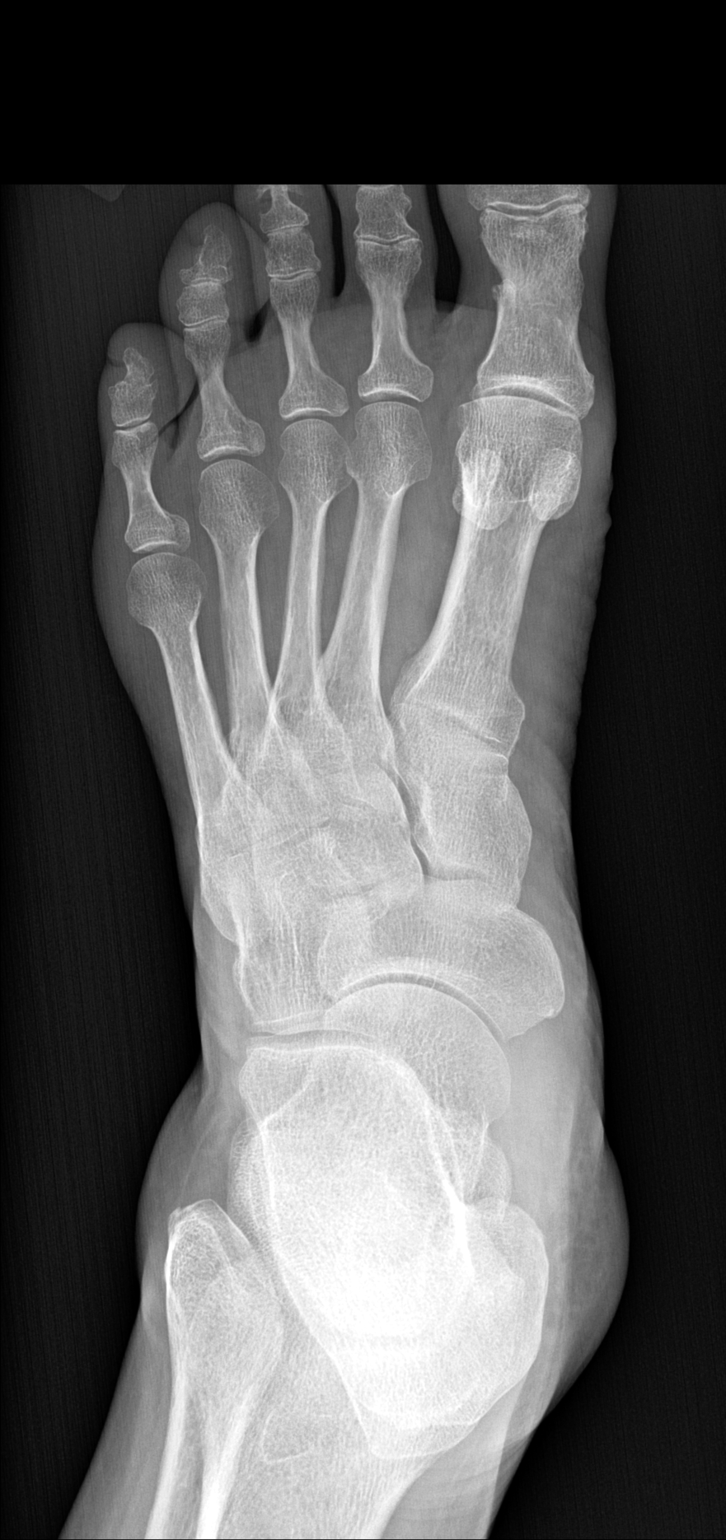

[foot lat]
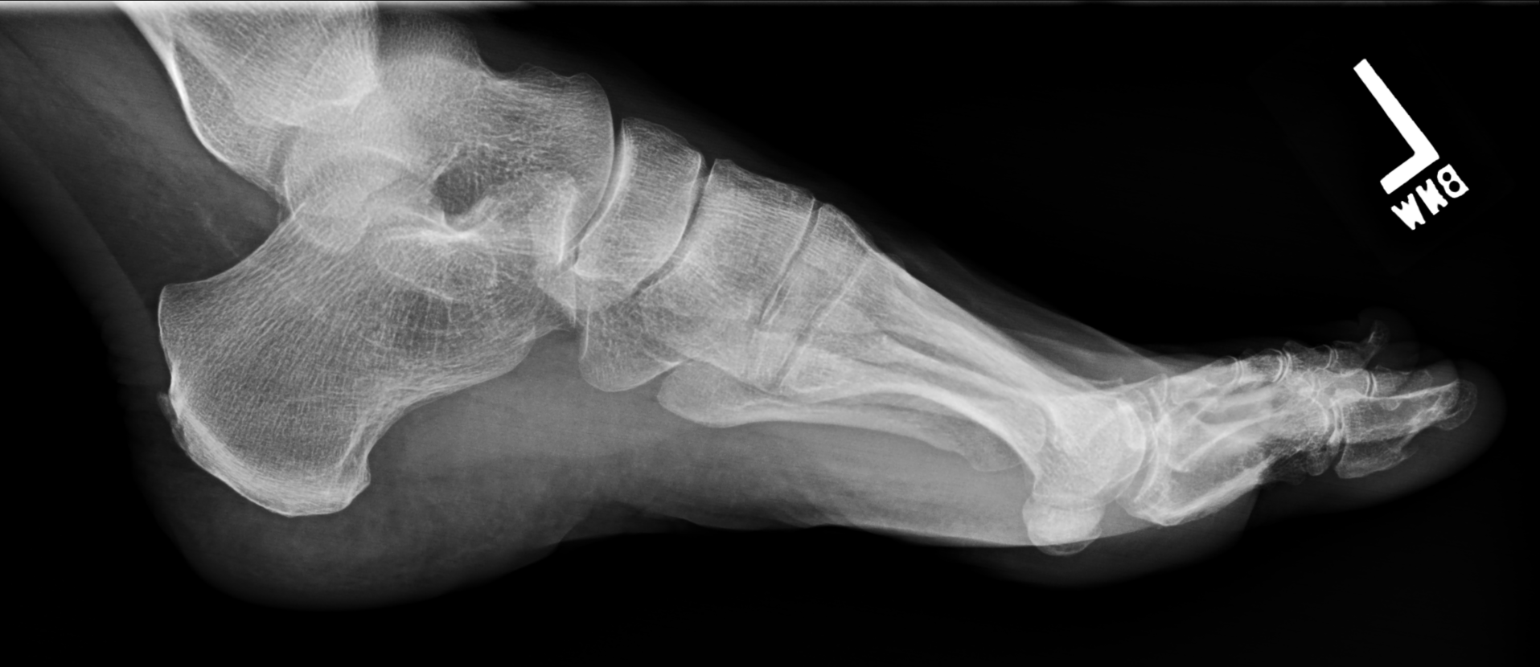

[2 of 2 positions shown; findings below may reference images not displayed]

FINDINGS: There is no evidence of fracture or dislocation. There is no
evidence of arthropathy or other focal bone abnormality. Soft
tissues are unremarkable.
IMPRESSION: Negative.

## 2017-12-13 ENCOUNTER — Ambulatory Visit (INDEPENDENT_AMBULATORY_CARE_PROVIDER_SITE_OTHER): Payer: Medicare HMO

## 2017-12-13 VITALS — BP 120/68 | HR 80 | Temp 98.0°F | Resp 14 | Ht 69.5 in | Wt 192.4 lb

## 2017-12-13 DIAGNOSIS — Z0000 Encounter for general adult medical examination without abnormal findings: Secondary | ICD-10-CM

## 2017-12-13 NOTE — Progress Notes (Signed)
I have reviewed the above note and agree.  Lexis Potenza, M.D.  

## 2017-12-13 NOTE — Patient Instructions (Addendum)
  Donald [name] , Thank you for taking time to come for your Medicare Wellness Visit. I appreciate your ongoing commitment to your health goals. Please review the following plan we discussed and let me know if I can assist you in the future.   These are the goals we discussed: Goals    . DIET - REDUCE SUGAR INTAKE     Low carb diet       This is a list of the screening recommended for you and due dates:  Health Maintenance  Topic Date Due  . Complete foot exam   11/30/2016  . Urine Protein Check  06/04/2017  . Pneumonia vaccines (2 of 2 - PPSV23) 12/04/2017  . Flu Shot  05/30/2018*  . Hemoglobin A1C  03/24/2018  . Eye exam for diabetics  08/27/2018  . Tetanus Vaccine  06/19/2021  . Colon Cancer Screening  02/09/2024  .  Hepatitis C: One time screening is recommended by Center for Disease Control  (CDC) for  adults born from [birth year] through 1963.   Completed  *Topic was postponed. The date shown is not the original due date.

## 2017-12-13 NOTE — Progress Notes (Signed)
Subjective:   Donald [name] is a 72 y.o. male who presents for Medicare Annual/Subsequent preventive examination.  Review of Systems:  No ROS.  Medicare Wellness Visit. Additional risk factors are reflected in the social history. Cardiac Risk Factors include: advanced age (>64men, >66 women);male gender;diabetes mellitus     Objective:    Vitals: BP 120/68 (BP Location: Left Arm, Patient Position: Sitting, Cuff Size: Normal)   Pulse 80   Temp 98 F (36.7 C) (Oral)   Resp 14   Ht 5' 9.5" (1.765 m)   Wt 192 lb 6.4 oz (87.3 kg)   SpO2 95%   BMI 28.01 kg/m   Body mass index is 28.01 kg/m.  Advanced Directives 12/13/2017 12/12/2016 12/13/2015  Does Patient Have a Medical Advance Directive? No No No  Does patient want to make changes to medical advance directive? - Yes (MAU/Ambulatory/Procedural Areas - Information given) -  Would patient like information on creating a medical advance directive? No - Patient declined - No - patient declined information    Tobacco Social History   Tobacco Use  Smoking Status Former Smoker  Smokeless Tobacco Never Used     Counseling given: Not Answered   Clinical Intake:  Pre-visit preparation completed: Yes  Pain : No/denies pain     Nutritional Status: BMI 25 -29 Overweight Diabetes: Yes(Followed by PCP)  How often do you need to have someone help you when you read instructions, pamphlets, or other written materials from your doctor or pharmacy?: 1 - Never  Interpreter Needed?: No     Past Medical History:  Diagnosis Date  . Carpal tunnel syndrome   . Diabetes mellitus without complication ([place])   . Nephrolithiasis    H/O stones  . Rheumatoid arthritis [facility])    Past Surgical History:  Procedure Laterality Date  . APPENDECTOMY  1962  . CARPAL TUNNEL RELEASE Bilateral    hands   Family History  Problem Relation Age of Onset  . Heart disease Father   . Hypertension Father   . Diabetes Maternal Grandmother    Social  History   Socioeconomic History  . Marital status: Married    Spouse name: Not on file  . Number of children: Not on file  . Years of education: Not on file  . Highest education level: Not on file  Occupational History  . Not on file  Social Needs  . Financial resource strain: Not hard at all  . Food insecurity:    Worry: Never true    Inability: Never true  . Transportation needs:    Medical: No    Non-medical: No  Tobacco Use  . Smoking status: Former Research scientist (life sciences)  . Smokeless tobacco: Never Used  Substance and Sexual Activity  . Alcohol use: No    Alcohol/week: 0.0 oz  . Drug use: No  . Sexual activity: Not Currently  Lifestyle  . Physical activity:    Days per week: Not on file    Minutes per session: Not on file  . Stress: Not on file  Relationships  . Social connections:    Talks on phone: Not on file    Gets together: Not on file    Attends religious service: Not on file    Active member of club or organization: Not on file    Attends meetings of clubs or organizations: Not on file    Relationship status: Not on file  Other Topics Concern  . Not on file  Social History Narrative  .  Not on file    Outpatient Encounter Medications as of 12/13/2017  Medication Sig  . cetirizine (ZYRTEC) 10 MG tablet Take 10 mg by mouth as needed for allergies.  Marland Kitchen glucose blood (ONE TOUCH ULTRA TEST) test strip USE TO TEXT BLOOD SUGAR SUGAR TWICE A DAY  . hydroxychloroquine (PLAQUENIL) 200 MG tablet Take 200 mg by mouth daily.  . pramipexole (MIRAPEX) 0.25 MG tablet Take 1 tablet (0.25 mg total) by mouth daily. Take in the pm  . rosuvastatin (CRESTOR) 5 MG tablet Take 1 tablet (5 mg total) by mouth daily.   No facility-administered encounter medications on file as of 12/13/2017.     Activities of Daily Living In your present state of health, do you have any difficulty performing the following activities: 12/13/2017  Hearing? N  Vision? N  Difficulty concentrating or making  decisions? N  Walking or climbing stairs? N  Dressing or bathing? N  Doing errands, shopping? N  Preparing Food and eating ? N  Using the Toilet? N  In the past six months, have you accidently leaked urine? N  Do you have problems with loss of bowel control? N  Managing your Medications? N  Managing your Finances? N  Housekeeping or managing your Housekeeping? N  Some recent data might be hidden    Patient Care Team: Einar Pheasant, MD as PCP - General (Internal Medicine)   Assessment:   This is a routine wellness examination for Donald [name].  The goal of the wellness visit is to assist the patient how to close the gaps in care and create a preventative care plan for the patient.   The roster of all physicians providing medical care to patient is listed in the Snapshot section of the chart.  Osteoporosis risk reviewed.    Safety issues reviewed; Smoke and carbon monoxide detectors in the home. No firearms or firearms locked in a safe within the home. Wears seatbelts when driving or riding with others. No violence in the home.  They do not have excessive sun exposure.  Discussed the need for sun protection: hats, long sleeves and the use of sunscreen if there is significant sun exposure.  Patient is alert, normal appearance, oriented to person/place/and time.  Correctly identified the president of the [place] and recalls of 1/3 words. Performs simple calculations and can read correct time from watch face. Displays appropriate judgement.  No new identified risk were noted.  No failures at ADL's or IADL's.    BMI- discussed the importance of a healthy diet, water intake and the benefits of aerobic exercise. Educational material provided.   24 hour diet recall: Regular diet   Dental- every 6 months.   Eye- Visual acuity not assessed per patient preference since they have regular follow up with the ophthalmologist.  Wears corrective lenses.  Sleep patterns- Sleeps 6-7 hours at night.   Wakes feeling rested.  Diabetes- followed by PCP.  Foot exam due.  Patient Concerns: None at this time. Follow up with PCP as needed.  Exercise Activities and Dietary recommendations Current Exercise Habits: Home exercise routine, Type of exercise: walking, Time (Minutes): 20, Frequency (Times/Week): 4, Weekly Exercise (Minutes/Week): 80, Intensity: Mild  Goals    . DIET - REDUCE SUGAR INTAKE     Low carb diet       Fall Risk Fall Risk  12/13/2017 12/12/2016 06/04/2016 12/13/2015 12/01/2015  Falls in the past year? No No No No No  Comment - - - - -  Number falls in  past yr: - - - - -  Injury with Fall? - - - - -   Depression Screen PHQ 2/9 Scores 12/13/2017 12/12/2016 06/04/2016 12/13/2015  PHQ - 2 Score 0 0 0 0  Exception Documentation - - Patient refusal -    Cognitive Function MMSE - Mini Mental State Exam 12/13/2017 12/12/2016 12/13/2015  Orientation to time 5 5 5   Orientation to Place 5 5 5   Registration 3 3 3   Attention/ Calculation 5 5 5   Recall 1 3 3   Language- name 2 objects 2 2 2   Language- repeat 1 1 1   Language- follow 3 step command 3 3 3   Language- read & follow direction 1 1 1   Write a sentence 1 1 1   Copy design 1 1 1   Total score 28 30 30         Immunization History  Administered Date(s) Administered  . Pneumococcal Conjugate-13 06/04/2016  . Pneumococcal Polysaccharide-23 12/04/2012  . Td 05/14/2011  . Zoster 04/04/2012, 06/19/2013   Screening Tests Health Maintenance  Topic Date Due  . FOOT EXAM  11/30/2016  . URINE MICROALBUMIN  06/04/2017  . PNA vac Low Risk Adult (2 of 2 - PPSV23) 12/04/2017  . INFLUENZA VACCINE  05/30/2018 (Originally 04/10/2018)  . HEMOGLOBIN A1C  03/24/2018  . OPHTHALMOLOGY EXAM  08/27/2018  . TETANUS/TDAP  06/19/2021  . COLONOSCOPY  02/09/2024  . Hepatitis C Screening  Completed      Plan:   End of life planning; Advanced aging; Advanced directives discussed.  No HCPOA/Living Will.  Additional information declined at this  time.  I have personally reviewed and noted the following in the patient's chart:   . Medical and social history . Use of alcohol, tobacco or illicit drugs  . Current medications and supplements . Functional ability and status . Nutritional status . Physical activity . Advanced directives . List of other physicians . Hospitalizations, surgeries, and ER visits in previous 12 months . Vitals . Screenings to include cognitive, depression, and falls . Referrals and appointments  In addition, I have reviewed and discussed with patient certain preventive protocols, quality metrics, and best practice recommendations. A written personalized care plan for preventive services as well as general preventive health recommendations were provided to patient.     Varney Biles, LPN  05/13/7168

## 2017-12-15 ENCOUNTER — Other Ambulatory Visit: Payer: Self-pay | Admitting: Internal Medicine

## 2017-12-31 DIAGNOSIS — C44519 Basal cell carcinoma of skin of other part of trunk: Secondary | ICD-10-CM | POA: Diagnosis not present

## 2018-01-07 ENCOUNTER — Other Ambulatory Visit: Payer: Self-pay | Admitting: Internal Medicine

## 2018-01-15 DIAGNOSIS — D044 Carcinoma in situ of skin of scalp and neck: Secondary | ICD-10-CM | POA: Diagnosis not present

## 2018-01-15 DIAGNOSIS — C4442 Squamous cell carcinoma of skin of scalp and neck: Secondary | ICD-10-CM | POA: Diagnosis not present

## 2018-01-27 ENCOUNTER — Other Ambulatory Visit (INDEPENDENT_AMBULATORY_CARE_PROVIDER_SITE_OTHER): Payer: Medicare HMO

## 2018-01-27 DIAGNOSIS — E119 Type 2 diabetes mellitus without complications: Secondary | ICD-10-CM | POA: Diagnosis not present

## 2018-01-27 DIAGNOSIS — Z125 Encounter for screening for malignant neoplasm of prostate: Secondary | ICD-10-CM | POA: Diagnosis not present

## 2018-01-27 DIAGNOSIS — E7800 Pure hypercholesterolemia, unspecified: Secondary | ICD-10-CM

## 2018-01-27 DIAGNOSIS — M069 Rheumatoid arthritis, unspecified: Secondary | ICD-10-CM

## 2018-01-27 LAB — HEPATIC FUNCTION PANEL
ALT: 17 U/L (ref 0–53)
AST: 16 U/L (ref 0–37)
Albumin: 4.3 g/dL (ref 3.5–5.2)
Alkaline Phosphatase: 58 U/L (ref 39–117)
BILIRUBIN TOTAL: 0.4 mg/dL (ref 0.2–1.2)
Bilirubin, Direct: 0.1 mg/dL (ref 0.0–0.3)
Total Protein: 6.9 g/dL (ref 6.0–8.3)

## 2018-01-27 LAB — CBC WITH DIFFERENTIAL/PLATELET
Basophils Absolute: 0.1 10*3/uL (ref 0.0–0.1)
Basophils Relative: 1.3 % (ref 0.0–3.0)
EOS PCT: 5.4 % — AB (ref 0.0–5.0)
Eosinophils Absolute: 0.3 10*3/uL (ref 0.0–0.7)
HCT: 41.7 % (ref 39.0–52.0)
HEMOGLOBIN: 14.6 g/dL (ref 13.0–17.0)
Lymphocytes Relative: 18.9 % (ref 12.0–46.0)
Lymphs Abs: 1 10*3/uL (ref 0.7–4.0)
MCHC: 35 g/dL (ref 30.0–36.0)
MCV: 97.8 fl (ref 78.0–100.0)
MONO ABS: 0.6 10*3/uL (ref 0.1–1.0)
MONOS PCT: 12.2 % — AB (ref 3.0–12.0)
Neutro Abs: 3.2 10*3/uL (ref 1.4–7.7)
Neutrophils Relative %: 62.2 % (ref 43.0–77.0)
Platelets: 209 10*3/uL (ref 150.0–400.0)
RBC: 4.26 Mil/uL (ref 4.22–5.81)
RDW: 12.6 % (ref 11.5–15.5)
WBC: 5.1 10*3/uL (ref 4.0–10.5)

## 2018-01-27 LAB — BASIC METABOLIC PANEL
BUN: 25 mg/dL — ABNORMAL HIGH (ref 6–23)
CALCIUM: 9.2 mg/dL (ref 8.4–10.5)
CHLORIDE: 104 meq/L (ref 96–112)
CO2: 25 meq/L (ref 19–32)
Creatinine, Ser: 1.21 mg/dL (ref 0.40–1.50)
GFR: 63.01 mL/min (ref >60.00)
Glucose, Bld: 120 mg/dL — ABNORMAL HIGH (ref 70–99)
POTASSIUM: 4.6 meq/L (ref 3.5–5.1)
SODIUM: 137 meq/L (ref 135–145)

## 2018-01-27 LAB — MICROALBUMIN / CREATININE URINE RATIO
Creatinine,U: 172 mg/dL
MICROALB/CREAT RATIO: 0.5 mg/g (ref 0.0–30.0)
Microalb, Ur: 0.9 mg/dL (ref 0.0–1.9)

## 2018-01-27 LAB — TSH: TSH: 0.95 u[IU]/mL (ref 0.35–4.50)

## 2018-01-27 LAB — LIPID PANEL
CHOL/HDL RATIO: 3
Cholesterol: 127 mg/dL (ref 0–200)
HDL: 41.4 mg/dL (ref >39.00)
LDL Cholesterol: 58 mg/dL (ref 0–99)
NONHDL: 86.06
Triglycerides: 140 mg/dL (ref 0.0–149.0)
VLDL: 28 mg/dL (ref 0.0–40.0)

## 2018-01-27 LAB — PSA, MEDICARE: PSA: 0.88 ng/mL (ref 0.10–4.00)

## 2018-01-27 LAB — HEMOGLOBIN A1C: HEMOGLOBIN A1C: 6.2 % (ref 4.6–6.5)

## 2018-01-29 ENCOUNTER — Encounter: Payer: Self-pay | Admitting: Internal Medicine

## 2018-01-29 ENCOUNTER — Ambulatory Visit (INDEPENDENT_AMBULATORY_CARE_PROVIDER_SITE_OTHER): Payer: Medicare HMO | Admitting: Internal Medicine

## 2018-01-29 VITALS — BP 124/76 | HR 80 | Temp 97.9°F | Resp 16 | Ht 70.0 in | Wt 193.8 lb

## 2018-01-29 DIAGNOSIS — M069 Rheumatoid arthritis, unspecified: Secondary | ICD-10-CM

## 2018-01-29 DIAGNOSIS — C4490 Unspecified malignant neoplasm of skin, unspecified: Secondary | ICD-10-CM | POA: Diagnosis not present

## 2018-01-29 DIAGNOSIS — Z0000 Encounter for general adult medical examination without abnormal findings: Secondary | ICD-10-CM | POA: Diagnosis not present

## 2018-01-29 DIAGNOSIS — I350 Nonrheumatic aortic (valve) stenosis: Secondary | ICD-10-CM

## 2018-01-29 DIAGNOSIS — E119 Type 2 diabetes mellitus without complications: Secondary | ICD-10-CM | POA: Diagnosis not present

## 2018-01-29 DIAGNOSIS — E7800 Pure hypercholesterolemia, unspecified: Secondary | ICD-10-CM

## 2018-01-29 NOTE — Progress Notes (Signed)
Patient ID: DOMONICK SITTNER, male   DOB: 1946-04-05, 72 y.o.   MRN: [id number]   Subjective:    Patient ID: MIKEAL WINSTANLEY, male    DOB: November 29, 1945, 72 y.o.   MRN: [id number]  HPI  Patient here for his physical exam.  States he is doing well.  Stays active.  No chest pain.  No sob.  No acid reflux.  No abdominal pain.  Bowels moving.  Seeing dermatology.  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  Has f/u  today to get sutures removed.  Discussed shingrix.  Declines.     Past Medical History:  Diagnosis Date  . Carpal tunnel syndrome   . Diabetes mellitus without complication ([place])   . Nephrolithiasis    H/O stones  . Rheumatoid arthritis [facility])    Past Surgical History:  Procedure Laterality Date  . APPENDECTOMY  1962  . CARPAL TUNNEL RELEASE Bilateral    hands   Family History  Problem Relation Age of Onset  . Heart disease Father   . Hypertension Father   . Diabetes Maternal Grandmother    Social History   Socioeconomic History  . Marital status: Married    Spouse name: Not on file  . Number of children: Not on file  . Years of education: Not on file  . Highest education level: Not on file  Occupational History  . Not on file  Social Needs  . Financial resource strain: Not hard at all  . Food insecurity:    Worry: Never true    Inability: Never true  . Transportation needs:    Medical: No    Non-medical: No  Tobacco Use  . Smoking status: Former Research scientist (life sciences)  . Smokeless tobacco: Never Used  Substance and Sexual Activity  . Alcohol use: No    Alcohol/week: 0.0 oz  . Drug use: No  . Sexual activity: Not Currently  Lifestyle  . Physical activity:    Days per week: Not on file    Minutes per session: Not on file  . Stress: Not on file  Relationships  . Social connections:    Talks on phone: Not on file    Gets together: Not on file    Attends religious service: Not on file    Active member of club or organization: Not on file    Attends meetings  of clubs or organizations: Not on file    Relationship status: Not on file  Other Topics Concern  . Not on file  Social History Narrative  . Not on file    Outpatient Encounter Medications as of 01/29/2018  Medication Sig  . cetirizine (ZYRTEC) 10 MG tablet Take 10 mg by mouth as needed for allergies.  Marland Kitchen glucose blood (ONE TOUCH ULTRA TEST) test strip USE TO TEXT BLOOD SUGAR SUGAR TWICE A DAY  . hydroxychloroquine (PLAQUENIL) 200 MG tablet Take 200 mg by mouth daily.  . pramipexole (MIRAPEX) 0.25 MG tablet Take 1 tablet (0.25 mg total) by mouth daily. Take in the pm  . rosuvastatin (CRESTOR) 5 MG tablet TAKE 1 TABLET BY MOUTH EVERY DAY   No facility-administered encounter medications on file as of 01/29/2018.     Review of Systems  Constitutional: Negative for appetite change and unexpected weight change.  HENT: Negative for congestion and sinus pressure.   Eyes: Negative for pain and visual disturbance.  Respiratory: Negative for cough, chest tightness and shortness of breath.   Cardiovascular: Negative for chest pain, palpitations  and leg swelling.  Gastrointestinal: Negative for abdominal pain, diarrhea and vomiting.  Genitourinary: Negative for difficulty urinating, dysuria and frequency.  Musculoskeletal: Negative for joint swelling and myalgias.  Skin: Negative for color change and rash.  Neurological: Negative for dizziness, light-headedness and headaches.  Hematological: Negative for adenopathy. Does not bruise/bleed easily.  Psychiatric/Behavioral: Negative for agitation and dysphoric mood.       Objective:    Physical Exam  Constitutional: He is oriented to person, place, and time. He appears well-developed and well-nourished. No distress.  HENT:  Head: Normocephalic and atraumatic.  Nose: Nose normal.  Mouth/Throat: Oropharynx is clear and moist. No oropharyngeal exudate.  Eyes: Conjunctivae are normal. Right eye exhibits no discharge. Left eye exhibits no  discharge.  Neck: Neck supple. No thyromegaly present.  Cardiovascular: Normal rate and regular rhythm.  1/6 systolic murmur.   Pulmonary/Chest: Breath sounds normal. No respiratory distress. He has no wheezes.  Abdominal: Soft. Bowel sounds are normal. There is no tenderness.  Genitourinary: Rectum normal and penis normal.  Musculoskeletal: He exhibits no edema or tenderness.  Lymphadenopathy:    He has no cervical adenopathy.  Neurological: He is alert and oriented to person, place, and time.  Skin: Skin is warm and dry. No rash noted.  Psychiatric: He has a normal mood and affect. His behavior is normal.    BP 124/76 (BP Location: Left Arm, Patient Position: Sitting, Cuff Size: Normal)   Pulse 80   Temp 97.9 F (36.6 C) (Oral)   Resp 16   Ht _0  (1.778 m)   Wt 193 lb 12.8 oz (87.9 kg)   SpO2 98%   BMI 27.81 kg/m  Wt Readings from Last 3 Encounters:  01/29/18 193 lb 12.8 oz (87.9 kg)  12/13/17 192 lb 6.4 oz (87.3 kg)  09/27/17 194 lb 12.8 oz (88.4 kg)     Lab Results  Component Value Date   WBC 5.1 01/27/2018   HGB 14.6 01/27/2018   HCT 41.7 01/27/2018   PLT 209.0 01/27/2018   GLUCOSE 120 (H) 01/27/2018   CHOL 127 01/27/2018   TRIG 140.0 01/27/2018   HDL 41.40 01/27/2018   LDLCALC 58 01/27/2018   ALT 17 01/27/2018   AST 16 01/27/2018   NA 137 01/27/2018   K 4.6 01/27/2018   CL 104 01/27/2018   CREATININE 1.21 01/27/2018   BUN 25 (H) 01/27/2018   CO2 25 01/27/2018   TSH 0.95 01/27/2018   PSA 0.88 01/27/2018   HGBA1C 6.2 01/27/2018   MICROALBUR 0.9 01/27/2018       Assessment & Plan:   Problem List Items Addressed This Visit    Aortic stenosis    Mild AS noted on echo.  Currently asymptomatic.  Follow.        Diabetes ([place])    Low carb diet and exercise.  Follow met b and a1c.   Lab Results  Component Value Date   HGBA1C 6.2 01/27/2018        Relevant Orders   Hemoglobin J8H   Basic metabolic panel   Health care maintenance    Physical  today 01/29/18.  Colonoscopy 02/08/14- internal hemorrhoids.  psa .88 - 01/27/18.  Declines shingrix.        Hypercholesterolemia    On crestor.  Low cholesterol diet and exercise.  Discussed lab results.   Lab Results  Component Value Date   CHOL 127 01/27/2018   HDL 41.40 01/27/2018   LDLCALC 58 01/27/2018   TRIG 140.0 01/27/2018  CHOLHDL 3 01/27/2018        Relevant Orders   Hepatic function panel   Lipid panel   Rheumatoid arthritis ([place])    Has been followed by Dr [name].  On plaquenil.  Stable.        Skin cancer    Seeing dermatology.  S/p removal.  Due f/u today to have sutures removed.         Other Visit Diagnoses    Routine general medical examination at a health care facility    -  Primary       Einar Pheasant, MD

## 2018-01-29 NOTE — Assessment & Plan Note (Signed)
Physical today 01/29/18.  Colonoscopy 02/08/14- internal hemorrhoids.  psa .88 - 01/27/18.  Declines shingrix.

## 2018-02-01 ENCOUNTER — Encounter: Payer: Self-pay | Admitting: Internal Medicine

## 2018-02-01 DIAGNOSIS — C4490 Unspecified malignant neoplasm of skin, unspecified: Secondary | ICD-10-CM | POA: Insufficient documentation

## 2018-02-01 NOTE — Assessment & Plan Note (Signed)
On crestor.  Low cholesterol diet and exercise.  Discussed lab results.   Lab Results  Component Value Date   CHOL 127 01/27/2018   HDL 41.40 01/27/2018   LDLCALC 58 01/27/2018   TRIG 140.0 01/27/2018   CHOLHDL 3 01/27/2018

## 2018-02-01 NOTE — Assessment & Plan Note (Signed)
Mild AS noted on echo.  Currently asymptomatic.  Follow.

## 2018-02-01 NOTE — Assessment & Plan Note (Signed)
Has been followed by Dr [name].  On plaquenil.  Stable.

## 2018-02-01 NOTE — Assessment & Plan Note (Signed)
Seeing dermatology.  S/p removal.  Due f/u today to have sutures removed.

## 2018-02-01 NOTE — Assessment & Plan Note (Signed)
Low carb diet and exercise.  Follow met b and a1c.   Lab Results  Component Value Date   HGBA1C 6.2 01/27/2018

## 2018-02-04 DIAGNOSIS — C44619 Basal cell carcinoma of skin of left upper limb, including shoulder: Secondary | ICD-10-CM | POA: Diagnosis not present

## 2018-02-19 DIAGNOSIS — M0579 Rheumatoid arthritis with rheumatoid factor of multiple sites without organ or systems involvement: Secondary | ICD-10-CM | POA: Diagnosis not present

## 2018-02-19 DIAGNOSIS — E119 Type 2 diabetes mellitus without complications: Secondary | ICD-10-CM | POA: Diagnosis not present

## 2018-05-20 DIAGNOSIS — D225 Melanocytic nevi of trunk: Secondary | ICD-10-CM | POA: Diagnosis not present

## 2018-05-20 DIAGNOSIS — Z85828 Personal history of other malignant neoplasm of skin: Secondary | ICD-10-CM | POA: Diagnosis not present

## 2018-05-20 DIAGNOSIS — D2272 Melanocytic nevi of left lower limb, including hip: Secondary | ICD-10-CM | POA: Diagnosis not present

## 2018-05-20 DIAGNOSIS — D2261 Melanocytic nevi of right upper limb, including shoulder: Secondary | ICD-10-CM | POA: Diagnosis not present

## 2018-05-20 DIAGNOSIS — C44712 Basal cell carcinoma of skin of right lower limb, including hip: Secondary | ICD-10-CM | POA: Diagnosis not present

## 2018-05-20 DIAGNOSIS — L570 Actinic keratosis: Secondary | ICD-10-CM | POA: Diagnosis not present

## 2018-05-20 DIAGNOSIS — D485 Neoplasm of uncertain behavior of skin: Secondary | ICD-10-CM | POA: Diagnosis not present

## 2018-05-20 DIAGNOSIS — Z08 Encounter for follow-up examination after completed treatment for malignant neoplasm: Secondary | ICD-10-CM | POA: Diagnosis not present

## 2018-05-20 DIAGNOSIS — D2262 Melanocytic nevi of left upper limb, including shoulder: Secondary | ICD-10-CM | POA: Diagnosis not present

## 2018-05-20 DIAGNOSIS — D2271 Melanocytic nevi of right lower limb, including hip: Secondary | ICD-10-CM | POA: Diagnosis not present

## 2018-05-30 ENCOUNTER — Encounter: Payer: Self-pay | Admitting: Internal Medicine

## 2018-05-30 ENCOUNTER — Ambulatory Visit (INDEPENDENT_AMBULATORY_CARE_PROVIDER_SITE_OTHER): Payer: Medicare HMO | Admitting: Internal Medicine

## 2018-05-30 VITALS — BP 116/56 | HR 85 | Temp 97.8°F | Ht 70.0 in | Wt 194.8 lb

## 2018-05-30 DIAGNOSIS — B029 Zoster without complications: Secondary | ICD-10-CM

## 2018-05-30 DIAGNOSIS — R21 Rash and other nonspecific skin eruption: Secondary | ICD-10-CM | POA: Diagnosis not present

## 2018-05-30 MED ORDER — OXYCODONE-ACETAMINOPHEN 5-325 MG PO TABS: 1.0000 | ORAL_TABLET | Freq: Two times a day (BID) | ORAL | 0 refills | Status: DC | PRN

## 2018-05-30 MED ORDER — METHYLPREDNISOLONE ACETATE 40 MG/ML IJ SUSP
40.0000 mg | Freq: Once | INTRAMUSCULAR | Status: AC
  Administered 2018-05-30: 40 mg via INTRAMUSCULAR

## 2018-05-30 MED ORDER — VALACYCLOVIR HCL 1 G PO TABS: 1000.0000 mg | ORAL_TABLET | Freq: Three times a day (TID) | ORAL | 0 refills | Status: DC

## 2018-05-30 NOTE — Patient Instructions (Addendum)
Tylenol no more than 4000 mg per day tylenol is in the pain medication prescribed to you as well  Shingles Shingles, which is also known as herpes zoster, is an infection that causes a painful skin rash and fluid-filled blisters. Shingles is not related to genital herpes, which is a sexually transmitted infection. Shingles only develops in people who:  Have had chickenpox.  Have received the chickenpox vaccine. (This is rare.)  What are the causes? Shingles is caused by varicella-zoster virus (VZV). This is the same virus that causes chickenpox. After exposure to VZV, the virus stays in the body in an inactive (dormant) state. Shingles develops if the virus reactivates. This can happen many years after the initial exposure to VZV. It is not known what causes this virus to reactivate. What increases the risk? People who have had chickenpox or received the chickenpox vaccine are at risk for shingles. Infection is more common in people who:  Are older than age 28.  Have a weakened defense (immune) system, such as those with HIV, AIDS, or cancer.  Are taking medicines that weaken the immune system, such as transplant medicines.  Are under great stress.  What are the signs or symptoms? Early symptoms of this condition include itching, tingling, and pain in an area on your skin. Pain may be described as burning, stabbing, or throbbing. A few days or weeks after symptoms start, a painful red rash appears, usually on one side of the body in a bandlike or beltlike pattern. The rash eventually turns into fluid-filled blisters that break open, scab over, and dry up in about 2-3 weeks. At any time during the infection, you may also develop:  A fever.  Chills.  A headache.  An upset stomach.  How is this diagnosed? This condition is diagnosed with a skin exam. Sometimes, skin or fluid samples are taken from the blisters before a diagnosis is made. These samples are examined under a microscope  or sent to a lab for testing. How is this treated? There is no specific cure for this condition. Your health care provider will probably prescribe medicines to help you manage pain, recover more quickly, and avoid long-term problems. Medicines may include:  Antiviral drugs.  Anti-inflammatory drugs.  Pain medicines.  If the area involved is on your face, you may be referred to a specialist, such as an eye doctor (ophthalmologist) or an ear, nose, and throat (ENT) doctor to help you avoid eye problems, chronic pain, or disability. Follow these instructions at home: Medicines  Take medicines only as directed by your health care provider.  Apply an anti-itch or numbing cream to the affected area as directed by your health care provider. Blister and Rash Care  Take a cool bath or apply cool compresses to the area of the rash or blisters as directed by your health care provider. This may help with pain and itching.  Keep your rash covered with a loose bandage (dressing). Wear loose-fitting clothing to help ease the pain of material rubbing against the rash.  Keep your rash and blisters clean with mild soap and cool water or as directed by your health care provider.  Check your rash every day for signs of infection. These include redness, swelling, and pain that lasts or increases.  Do not pick your blisters.  Do not scratch your rash. General instructions  Rest as directed by your health care provider.  Keep all follow-up visits as directed by your health care provider. This is important.  Until your blisters scab over, your infection can cause chickenpox in people who have never had it or been vaccinated against it. To prevent this from happening, avoid contact with other people, especially: ? Babies. ? Pregnant women. ? Children who have eczema. ? Elderly people who have transplants. ? People who have chronic illnesses, such as leukemia or AIDS. Contact a health care provider  if:  Your pain is not relieved with prescribed medicines.  Your pain does not get better after the rash heals.  Your rash looks infected. Signs of infection include redness, swelling, and pain that lasts or increases. Get help right away if:  The rash is on your face or nose.  You have facial pain, pain around your eye area, or loss of feeling on one side of your face.  You have ear pain or you have ringing in your ear.  You have loss of taste.  Your condition gets worse. This information is not intended to replace advice given to you by your health care provider. Make sure you discuss any questions you have with your health care provider. Document Released: 08/27/2005 Document Revised: 04/22/2016 Document Reviewed: 07/08/2014 Elsevier Interactive Patient Education  2018 [employer].

## 2018-05-30 NOTE — Progress Notes (Signed)
Pre visit review using our clinic review tool, if applicable. No additional management support is needed unless otherwise documented below in the visit note. 

## 2018-05-30 NOTE — Addendum Note (Signed)
Addended by: Elpidio Galea T on: 05/30/2018 02:59 PM   Modules accepted: Orders

## 2018-05-30 NOTE — Progress Notes (Signed)
Chief Complaint  Patient presents with  . Poison Ivy    stomach and back   F/u  1. Right flank and back and abdomen blisters x 2 days painful itching 8/10 pain tried Tylenol 3 pills every 4 hours and hydrocortisone cream topically and he takes 2 zyrtec each day He does report also working in the yard and cut up a tree with poison ivy/oak  Review of Systems  Constitutional: Negative for fever.  Skin: Positive for itching and rash.   Past Medical History:  Diagnosis Date  . Carpal tunnel syndrome   . Diabetes mellitus without complication ([place])   . Nephrolithiasis    H/O stones  . Rheumatoid arthritis [facility])    Past Surgical History:  Procedure Laterality Date  . APPENDECTOMY  1962  . CARPAL TUNNEL RELEASE Bilateral    hands   Family History  Problem Relation Age of Onset  . Heart disease Father   . Hypertension Father   . Diabetes Maternal Grandmother    Social History   Socioeconomic History  . Marital status: Married    Spouse name: Not on file  . Number of children: Not on file  . Years of education: Not on file  . Highest education level: Not on file  Occupational History  . Not on file  Social Needs  . Financial resource strain: Not hard at all  . Food insecurity:    Worry: Never true    Inability: Never true  . Transportation needs:    Medical: No    Non-medical: No  Tobacco Use  . Smoking status: Former Research scientist (life sciences)  . Smokeless tobacco: Never Used  Substance and Sexual Activity  . Alcohol use: No    Alcohol/week: 0.0 standard drinks  . Drug use: No  . Sexual activity: Not Currently  Lifestyle  . Physical activity:    Days per week: Not on file    Minutes per session: Not on file  . Stress: Not on file  Relationships  . Social connections:    Talks on phone: Not on file    Gets together: Not on file    Attends religious service: Not on file    Active member of club or organization: Not on file    Attends meetings of clubs or organizations: Not on  file    Relationship status: Not on file  . Intimate partner violence:    Fear of current or ex partner: No    Emotionally abused: No    Physically abused: No    Forced sexual activity: No  Other Topics Concern  . Not on file  Social History Narrative  . Not on file   Current Meds  Medication Sig  . cetirizine (ZYRTEC) 10 MG tablet Take 10 mg by mouth as needed for allergies.  Marland Kitchen glucose blood (ONE TOUCH ULTRA TEST) test strip USE TO TEXT BLOOD SUGAR SUGAR TWICE A DAY  . hydroxychloroquine (PLAQUENIL) 200 MG tablet Take 200 mg by mouth daily.  . rosuvastatin (CRESTOR) 5 MG tablet TAKE 1 TABLET BY MOUTH EVERY DAY   No Known Allergies No results found for this or any previous visit (from the past 2160 hour(s)). Objective  Body mass index is 27.95 kg/m. Wt Readings from Last 3 Encounters:  05/30/18 194 lb 12.8 oz (88.4 kg)  01/29/18 193 lb 12.8 oz (87.9 kg)  12/13/17 192 lb 6.4 oz (87.3 kg)   Temp Readings from Last 3 Encounters:  05/30/18 97.8 F (36.6 C) (Oral)  01/29/18  97.9 F (36.6 C) (Oral)  12/13/17 98 F (36.7 C) (Oral)   BP Readings from Last 3 Encounters:  05/30/18 (!) 116/56  01/29/18 124/76  12/13/17 120/68   Pulse Readings from Last 3 Encounters:  05/30/18 85  01/29/18 80  12/13/17 80    Physical Exam  Constitutional: He is oriented to person, place, and time. Vital signs are normal. He appears well-developed and well-nourished. He is cooperative.  HENT:  Head: Normocephalic and atraumatic.  Mouth/Throat: Oropharynx is clear and moist and mucous membranes are normal.  Eyes: Pupils are equal, round, and reactive to light. Conjunctivae are normal.  Cardiovascular: Normal rate, regular rhythm and normal heart sounds.  Pulmonary/Chest: Effort normal and breath sounds normal.  Abdominal:    Neurological: He is alert and oriented to person, place, and time. Gait normal.  Skin: Skin is warm and dry. Rash noted.     Psychiatric: He has a normal mood and  affect. His speech is normal and behavior is normal. Judgment and thought content normal. Cognition and memory are normal.  Nursing note and vitals reviewed.   Assessment   1. Rash right abdomen and right back likely shingles less likely poison ivy Plan   1.  Valtrex 1000 tid x 7-10 days, prn percocet  Depo 40 x 1 to help with itching  Prn zyrtec itching  F/u in 2 wekes with pcp  Provider: Dr. Olivia Mackie McLean-Scocuzza-[facility]

## 2018-06-13 ENCOUNTER — Ambulatory Visit (INDEPENDENT_AMBULATORY_CARE_PROVIDER_SITE_OTHER): Payer: Medicare HMO | Admitting: Internal Medicine

## 2018-06-13 ENCOUNTER — Encounter: Payer: Self-pay | Admitting: Internal Medicine

## 2018-06-13 DIAGNOSIS — E119 Type 2 diabetes mellitus without complications: Secondary | ICD-10-CM

## 2018-06-13 DIAGNOSIS — B029 Zoster without complications: Secondary | ICD-10-CM

## 2018-06-13 MED ORDER — GABAPENTIN 100 MG PO CAPS: 100.0000 mg | ORAL_CAPSULE | Freq: Every day | ORAL | 1 refills | Status: DC

## 2018-06-13 NOTE — Progress Notes (Signed)
Patient ID: Donald [name], male   DOB: 07/05/1946, 72 y.o.   MRN: [id number]   Subjective:    Patient ID: Donald [name], male    DOB: 10/13/1945, 72 y.o.   MRN: [id number]  HPI  Patient here as a work in for f/u shingles.  Was evaluated recently by Dr TMS.  Diagnosed with shingles.  States rash is better. Still with some pain.  Not taking anything more.  States otherwise doing well.  No sob.  No acid reflux.  No abdominal pain.  Bowels moving.  Took valtrex.     Past Medical History:  Diagnosis Date  . Carpal tunnel syndrome   . Diabetes mellitus without complication ([facility])   . Nephrolithiasis    H/O stones  . Rheumatoid arthritis ([facility])    Past Surgical History:  Procedure Laterality Date  . APPENDECTOMY  1962  . CARPAL TUNNEL RELEASE Bilateral    hands   Family History  Problem Relation Age of Onset  . Heart disease Father   . Hypertension Father   . Diabetes Maternal Grandmother    Social History   Socioeconomic History  . Marital status: Married    Spouse name: Not on file  . Number of children: Not on file  . Years of education: Not on file  . Highest education level: Not on file  Occupational History  . Not on file  Social Needs  . Financial resource strain: Not hard at all  . Food insecurity:    Worry: Never true    Inability: Never true  . Transportation needs:    Medical: No    Non-medical: No  Tobacco Use  . Smoking status: Former Smoker  . Smokeless tobacco: Never Used  Substance and Sexual Activity  . Alcohol use: No    Alcohol/week: 0.0 standard drinks  . Drug use: No  . Sexual activity: Not Currently  Lifestyle  . Physical activity:    Days per week: Not on file    Minutes per session: Not on file  . Stress: Not on file  Relationships  . Social connections:    Talks on phone: Not on file    Gets together: Not on file    Attends religious service: Not on file    Active member of club or organization: Not on file    Attends meetings of clubs or  organizations: Not on file    Relationship status: Not on file  Other Topics Concern  . Not on file  Social History Narrative  . Not on file    Outpatient Encounter Medications as of 06/13/2018  Medication Sig  . cetirizine (ZYRTEC) 10 MG tablet Take 10 mg by mouth as needed for allergies.  . glucose blood (ONE TOUCH ULTRA TEST) test strip USE TO TEXT BLOOD SUGAR SUGAR TWICE A DAY  . hydroxychloroquine (PLAQUENIL) 200 MG tablet Take 200 mg by mouth daily.  . rosuvastatin (CRESTOR) 5 MG tablet TAKE 1 TABLET BY MOUTH EVERY DAY  . gabapentin (NEURONTIN) 100 MG capsule Take 1 capsule (100 mg total) by mouth at bedtime.  . [DISCONTINUED] oxyCODONE-acetaminophen (PERCOCET) 5-325 MG tablet Take 1 tablet by mouth 2 (two) times daily as needed for severe pain. (Patient not taking: Reported on 06/13/2018)  . [DISCONTINUED] valACYclovir (VALTREX) 1000 MG tablet Take 1 tablet (1,000 mg total) by mouth 3 (three) times daily. X 7-10 days (Patient not taking: Reported on 06/13/2018)   No facility-administered encounter medications on file as of 06/13/2018.       Review of Systems  Constitutional: Negative for appetite change and unexpected weight change.  Respiratory: Negative for cough and shortness of breath.   Cardiovascular: Negative for chest pain and leg swelling.  Gastrointestinal: Negative for abdominal pain, diarrhea, nausea and vomiting.  Musculoskeletal: Negative for joint swelling and myalgias.  Skin:       Persistent rash.  Resolving.    Psychiatric/Behavioral: Negative for agitation and dysphoric mood.       Objective:    Physical Exam  Constitutional: He appears well-developed and well-nourished. No distress.  Neck: Neck supple.  Cardiovascular: Normal rate and regular rhythm.  Pulmonary/Chest: Effort normal and breath sounds normal. No respiratory distress.  Abdominal: Soft. Bowel sounds are normal. There is no tenderness.  Musculoskeletal: He exhibits no edema or tenderness.    Lymphadenopathy:    He has no cervical adenopathy.  Skin:  Residual rash - resolving.  Lesions - drying, scab.   Psychiatric: He has a normal mood and affect. His behavior is normal.    BP 120/78 (BP Location: Left Arm, Patient Position: Sitting, Cuff Size: Normal)   Pulse 75   Temp 97.6 F (36.4 C) (Oral)   Resp 16   Ht 5' 10" (1.778 m)   Wt 191 lb (86.6 kg)   SpO2 98%   BMI 27.41 kg/m  Wt Readings from Last 3 Encounters:  06/13/18 191 lb (86.6 kg)  05/30/18 194 lb 12.8 oz (88.4 kg)  01/29/18 193 lb 12.8 oz (87.9 kg)     Lab Results  Component Value Date   WBC 5.1 01/27/2018   HGB 14.6 01/27/2018   HCT 41.7 01/27/2018   PLT 209.0 01/27/2018   GLUCOSE 120 (H) 01/27/2018   CHOL 127 01/27/2018   TRIG 140.0 01/27/2018   HDL 41.40 01/27/2018   LDLCALC 58 01/27/2018   ALT 17 01/27/2018   AST 16 01/27/2018   NA 137 01/27/2018   K 4.6 01/27/2018   CL 104 01/27/2018   CREATININE 1.21 01/27/2018   BUN 25 (H) 01/27/2018   CO2 25 01/27/2018   TSH 0.95 01/27/2018   PSA 0.88 01/27/2018   HGBA1C 6.2 01/27/2018   MICROALBUR 0.9 01/27/2018       Assessment & Plan:   Problem List Items Addressed This Visit    Diabetes ([facility])    Low carb diet and exercise.  Follow met b and a1c.        Shingles    Rash resolving.  Still with pain.  Trial of gabapentin.  Call with update.  Adjust dose as needed.             , MD  

## 2018-06-17 ENCOUNTER — Encounter: Payer: Self-pay | Admitting: Internal Medicine

## 2018-06-17 DIAGNOSIS — B029 Zoster without complications: Secondary | ICD-10-CM | POA: Insufficient documentation

## 2018-06-17 NOTE — Assessment & Plan Note (Signed)
Rash resolving.  Still with pain.  Trial of gabapentin.  Call with update.  Adjust dose as needed.

## 2018-06-17 NOTE — Assessment & Plan Note (Signed)
Low carb diet and exercise.  Follow met b and a1c.   

## 2018-06-25 DIAGNOSIS — C44712 Basal cell carcinoma of skin of right lower limb, including hip: Secondary | ICD-10-CM | POA: Diagnosis not present

## 2018-07-05 ENCOUNTER — Other Ambulatory Visit: Payer: Self-pay | Admitting: Internal Medicine

## 2018-07-07 ENCOUNTER — Other Ambulatory Visit: Payer: Self-pay | Admitting: Internal Medicine

## 2018-07-22 DIAGNOSIS — H25013 Cortical age-related cataract, bilateral: Secondary | ICD-10-CM | POA: Diagnosis not present

## 2018-07-22 DIAGNOSIS — H04123 Dry eye syndrome of bilateral lacrimal glands: Secondary | ICD-10-CM | POA: Diagnosis not present

## 2018-07-28 ENCOUNTER — Other Ambulatory Visit: Payer: Self-pay | Admitting: Internal Medicine

## 2018-08-01 ENCOUNTER — Other Ambulatory Visit (INDEPENDENT_AMBULATORY_CARE_PROVIDER_SITE_OTHER): Payer: Medicare HMO

## 2018-08-01 DIAGNOSIS — E7800 Pure hypercholesterolemia, unspecified: Secondary | ICD-10-CM | POA: Diagnosis not present

## 2018-08-01 DIAGNOSIS — E119 Type 2 diabetes mellitus without complications: Secondary | ICD-10-CM | POA: Diagnosis not present

## 2018-08-01 LAB — BASIC METABOLIC PANEL
BUN: 21 mg/dL (ref 6–23)
CALCIUM: 9.3 mg/dL (ref 8.4–10.5)
CO2: 28 mEq/L (ref 19–32)
CREATININE: 1.36 mg/dL (ref 0.40–1.50)
Chloride: 105 mEq/L (ref 96–112)
GFR: 54.98 mL/min — AB (ref >60.00)
Glucose, Bld: 126 mg/dL — ABNORMAL HIGH (ref 70–99)
Potassium: 4.8 mEq/L (ref 3.5–5.1)
Sodium: 139 mEq/L (ref 135–145)

## 2018-08-01 LAB — LIPID PANEL
CHOL/HDL RATIO: 3
CHOLESTEROL: 121 mg/dL (ref 0–200)
HDL: 43 mg/dL (ref >39.00)
LDL Cholesterol: 56 mg/dL (ref 0–99)
NonHDL: 77.86
TRIGLYCERIDES: 109 mg/dL (ref 0.0–149.0)
VLDL: 21.8 mg/dL (ref 0.0–40.0)

## 2018-08-01 LAB — HEPATIC FUNCTION PANEL
ALK PHOS: 52 U/L (ref 39–117)
ALT: 22 U/L (ref 0–53)
AST: 22 U/L (ref 0–37)
Albumin: 4.4 g/dL (ref 3.5–5.2)
BILIRUBIN DIRECT: 0.1 mg/dL (ref 0.0–0.3)
Total Bilirubin: 0.4 mg/dL (ref 0.2–1.2)
Total Protein: 6.9 g/dL (ref 6.0–8.3)

## 2018-08-01 LAB — HEMOGLOBIN A1C: Hgb A1c MFr Bld: 6.3 % (ref 4.6–6.5)

## 2018-08-04 ENCOUNTER — Other Ambulatory Visit: Payer: Self-pay | Admitting: Internal Medicine

## 2018-08-05 ENCOUNTER — Encounter: Payer: Self-pay | Admitting: Internal Medicine

## 2018-08-05 ENCOUNTER — Ambulatory Visit (INDEPENDENT_AMBULATORY_CARE_PROVIDER_SITE_OTHER): Payer: Medicare HMO | Admitting: Internal Medicine

## 2018-08-05 DIAGNOSIS — M069 Rheumatoid arthritis, unspecified: Secondary | ICD-10-CM

## 2018-08-05 DIAGNOSIS — R944 Abnormal results of kidney function studies: Secondary | ICD-10-CM | POA: Diagnosis not present

## 2018-08-05 DIAGNOSIS — I350 Nonrheumatic aortic (valve) stenosis: Secondary | ICD-10-CM

## 2018-08-05 DIAGNOSIS — E119 Type 2 diabetes mellitus without complications: Secondary | ICD-10-CM | POA: Diagnosis not present

## 2018-08-05 DIAGNOSIS — E7800 Pure hypercholesterolemia, unspecified: Secondary | ICD-10-CM

## 2018-08-05 NOTE — Assessment & Plan Note (Signed)
a1c 6.3.  Low carb diet and exercise.  Follow met b and a1c.   

## 2018-08-05 NOTE — Assessment & Plan Note (Signed)
Mild AS noted on previous ECHO.  Currently asymptomatic.  Discussed f/u echo.  Wants to monitor since asymptomatic.  Follow.

## 2018-08-05 NOTE — Progress Notes (Signed)
Patient ID: Donald [name], male   DOB: [birth year], 72 y.o.   MRN: [id number]   Subjective:    Patient ID: Donald [name], male    DOB: [birth year], 72 y.o.   MRN: [id number]  HPI  Patient here for a scheduled follow up.  Sees Dr [name].  On plaquenil.  Stable.  Recommended f/u in one year.  Also has mild AS noted on echo.  He is currently asymptomatic. No chest pain.  No sob.  Stays active.  No acid reflux. No abdominal pan.  Bowels moving.  Overall feels good.  Joints stable.     Past Medical History:  Diagnosis Date  . Carpal tunnel syndrome   . Diabetes mellitus without complication ([place])   . Nephrolithiasis    H/O stones  . Rheumatoid arthritis [facility])    Past Surgical History:  Procedure Laterality Date  . APPENDECTOMY  1962  . CARPAL TUNNEL RELEASE Bilateral    hands   Family History  Problem Relation Age of Onset  . Heart disease Father   . Hypertension Father   . Diabetes Maternal Grandmother    Social History   Socioeconomic History  . Marital status: Married    Spouse name: Not on file  . Number of children: Not on file  . Years of education: Not on file  . Highest education level: Not on file  Occupational History  . Not on file  Social Needs  . Financial resource strain: Not hard at all  . Food insecurity:    Worry: Never true    Inability: Never true  . Transportation needs:    Medical: No    Non-medical: No  Tobacco Use  . Smoking status: Former Research scientist (life sciences)  . Smokeless tobacco: Never Used  Substance and Sexual Activity  . Alcohol use: No    Alcohol/week: 0.0 standard drinks  . Drug use: No  . Sexual activity: Not Currently  Lifestyle  . Physical activity:    Days per week: Not on file    Minutes per session: Not on file  . Stress: Not on file  Relationships  . Social connections:    Talks on phone: Not on file    Gets together: Not on file    Attends religious service: Not on file    Active member of club or organization: Not on file    Attends  meetings of clubs or organizations: Not on file    Relationship status: Not on file  Other Topics Concern  . Not on file  Social History Narrative  . Not on file    Outpatient Encounter Medications as of 08/05/2018  Medication Sig  . ACCU-CHEK SOFTCLIX LANCETS lancets TEST TWO TIMES DAILY  . cetirizine (ZYRTEC) 10 MG tablet Take 10 mg by mouth as needed for allergies.  Marland Kitchen gabapentin (NEURONTIN) 100 MG capsule TAKE 1 CAPSULE (100 MG TOTAL) BY MOUTH AT BEDTIME.  Marland Kitchen glucose blood (ONE TOUCH ULTRA TEST) test strip USE TO TEXT BLOOD SUGAR SUGAR TWICE A DAY  . hydroxychloroquine (PLAQUENIL) 200 MG tablet Take 200 mg by mouth daily.  . rosuvastatin (CRESTOR) 5 MG tablet TAKE 1 TABLET BY MOUTH EVERY DAY   No facility-administered encounter medications on file as of 08/05/2018.     Review of Systems  Constitutional: Negative for appetite change and unexpected weight change.  HENT: Negative for congestion and sinus pressure.   Respiratory: Negative for cough, chest tightness and shortness of breath.   Cardiovascular: Negative for chest pain, palpitations  and leg swelling.  Gastrointestinal: Negative for abdominal pain, diarrhea, nausea and vomiting.  Genitourinary: Negative for difficulty urinating and dysuria.  Musculoskeletal: Negative for joint swelling and myalgias.  Skin: Negative for color change and rash.  Neurological: Negative for dizziness, light-headedness and headaches.  Psychiatric/Behavioral: Negative for agitation and dysphoric mood.       Objective:     Blood pressure rechecked by me:  130/2  Physical Exam  Constitutional: He appears well-developed and well-nourished. No distress.  HENT:  Nose: Nose normal.  Mouth/Throat: Oropharynx is clear and moist.  Neck: Neck supple.  Cardiovascular: Normal rate and regular rhythm.  2/6 harsh systolic murmur.   Pulmonary/Chest: Effort normal and breath sounds normal. No respiratory distress.  Abdominal: Soft. Bowel sounds are  normal. There is no tenderness.  Musculoskeletal: He exhibits no edema or tenderness.  Lymphadenopathy:    He has no cervical adenopathy.  Skin: No rash noted. No erythema.  Psychiatric: He has a normal mood and affect. His behavior is normal.    BP 122/70 (BP Location: Left Arm, Patient Position: Sitting, Cuff Size: Normal)   Pulse 72   Temp (!) 97.5 F (36.4 C) (Oral)   Resp 18   Wt 191 lb (86.6 kg)   SpO2 97%   BMI 27.41 kg/m  Wt Readings from Last 3 Encounters:  08/05/18 191 lb (86.6 kg)  06/13/18 191 lb (86.6 kg)  05/30/18 194 lb 12.8 oz (88.4 kg)     Lab Results  Component Value Date   WBC 5.1 01/27/2018   HGB 14.6 01/27/2018   HCT 41.7 01/27/2018   PLT 209.0 01/27/2018   GLUCOSE 126 (H) 08/01/2018   CHOL 121 08/01/2018   TRIG 109.0 08/01/2018   HDL 43.00 08/01/2018   LDLCALC 56 08/01/2018   ALT 22 08/01/2018   AST 22 08/01/2018   NA 139 08/01/2018   K 4.8 08/01/2018   CL 105 08/01/2018   CREATININE 1.36 08/01/2018   BUN 21 08/01/2018   CO2 28 08/01/2018   TSH 0.95 01/27/2018   PSA 0.88 01/27/2018   HGBA1C 6.3 08/01/2018   MICROALBUR 0.9 01/27/2018       Assessment & Plan:   Problem List Items Addressed This Visit    Aortic stenosis    Mild AS noted on previous ECHO.  Currently asymptomatic.  Discussed f/u echo.  Wants to monitor since asymptomatic.  Follow.        Decreased GFR    Noted on recent lab check.  (53).  Discussed avoiding antiinflammatories.  Stay hydrated.  Recheck metabolic panel in 3-4 weeks.        Relevant Orders   Basic metabolic panel   Diabetes ([place])    a1c 6.3.  Low carb diet and exercise.  Follow met b and a1c.        Hypercholesterolemia    On crestor.  Low cholesterol diet and exercise.  Follow lipid panel and liver function tests.   Lab Results  Component Value Date   CHOL 121 08/01/2018   HDL 43.00 08/01/2018   LDLCALC 56 08/01/2018   TRIG 109.0 08/01/2018   CHOLHDL 3 08/01/2018        Rheumatoid arthritis  ([place])    Followed by Dr [name].  On plaquenil.  Stable.  Sees yearly. Up to date with eye exams.            Einar Pheasant, MD

## 2018-08-05 NOTE — Assessment & Plan Note (Signed)
On crestor.  Low cholesterol diet and exercise.  Follow lipid panel and liver function tests.   Lab Results  Component Value Date   CHOL 121 08/01/2018   HDL 43.00 08/01/2018   LDLCALC 56 08/01/2018   TRIG 109.0 08/01/2018   CHOLHDL 3 08/01/2018

## 2018-08-05 NOTE — Assessment & Plan Note (Signed)
Noted on recent lab check.  (53).  Discussed avoiding antiinflammatories.  Stay hydrated.  Recheck metabolic panel in 3-4 weeks.

## 2018-08-05 NOTE — Assessment & Plan Note (Signed)
Followed by Dr [name].  On plaquenil.  Stable.  Sees yearly. Up to date with eye exams.

## 2018-08-26 ENCOUNTER — Other Ambulatory Visit (INDEPENDENT_AMBULATORY_CARE_PROVIDER_SITE_OTHER): Payer: Medicare HMO

## 2018-08-26 DIAGNOSIS — R944 Abnormal results of kidney function studies: Secondary | ICD-10-CM

## 2018-08-26 LAB — BASIC METABOLIC PANEL
BUN: 20 mg/dL (ref 6–23)
CALCIUM: 9.4 mg/dL (ref 8.4–10.5)
CO2: 28 mEq/L (ref 19–32)
Chloride: 103 mEq/L (ref 96–112)
Creatinine, Ser: 1.14 mg/dL (ref 0.40–1.50)
GFR: 67.39 mL/min (ref >60.00)
GLUCOSE: 118 mg/dL — AB (ref 70–99)
Potassium: 4.6 mEq/L (ref 3.5–5.1)
Sodium: 138 mEq/L (ref 135–145)

## 2018-10-03 ENCOUNTER — Other Ambulatory Visit: Payer: Self-pay | Admitting: Internal Medicine

## 2018-10-03 NOTE — Telephone Encounter (Signed)
I do not mind sending in refill, but please clarify if medication needed.  We prescribed #90 with one refill end of 06/2018.

## 2018-10-09 NOTE — Telephone Encounter (Signed)
Called pt and left a detailed VM. To call us back in regards to this medication.

## 2018-10-13 ENCOUNTER — Other Ambulatory Visit: Payer: Self-pay | Admitting: Internal Medicine

## 2018-10-13 NOTE — Telephone Encounter (Signed)
Copied from [place] [id number]. Topic: Quick Communication - Rx Refill/Question >> Oct 13, 2018  4:24 PM Windy Kalata wrote: Medication: gabapentin (NEURONTIN) 100 MG capsule  Has the patient contacted their pharmacy? Yes.   (Agent: If no, request that the patient contact the pharmacy for the refill.) (Agent: If yes, when and what did the pharmacy advise?) [place] office  Preferred Pharmacy (with phone number or street name): [id number] [place], [place] - [address] [phone] (Phone) [phone] (Fax)    Agent: Please be advised that RX refills may take up to 3 business days. We ask that you follow-up with your pharmacy.

## 2018-10-14 MED ORDER — GABAPENTIN 100 MG PO CAPS: 100.0000 mg | ORAL_CAPSULE | Freq: Every day | ORAL | 1 refills | Status: DC

## 2018-10-27 DIAGNOSIS — X32XXXA Exposure to sunlight, initial encounter: Secondary | ICD-10-CM | POA: Diagnosis not present

## 2018-10-27 DIAGNOSIS — D485 Neoplasm of uncertain behavior of skin: Secondary | ICD-10-CM | POA: Diagnosis not present

## 2018-10-27 DIAGNOSIS — L570 Actinic keratosis: Secondary | ICD-10-CM | POA: Diagnosis not present

## 2018-10-27 DIAGNOSIS — C44629 Squamous cell carcinoma of skin of left upper limb, including shoulder: Secondary | ICD-10-CM | POA: Diagnosis not present

## 2018-10-27 DIAGNOSIS — Z85828 Personal history of other malignant neoplasm of skin: Secondary | ICD-10-CM | POA: Diagnosis not present

## 2018-10-27 DIAGNOSIS — Z08 Encounter for follow-up examination after completed treatment for malignant neoplasm: Secondary | ICD-10-CM | POA: Diagnosis not present

## 2018-10-27 DIAGNOSIS — L820 Inflamed seborrheic keratosis: Secondary | ICD-10-CM | POA: Diagnosis not present

## 2018-10-29 DIAGNOSIS — Z0100 Encounter for examination of eyes and vision without abnormal findings: Secondary | ICD-10-CM | POA: Diagnosis not present

## 2018-10-29 DIAGNOSIS — H5203 Hypermetropia, bilateral: Secondary | ICD-10-CM | POA: Diagnosis not present

## 2018-11-07 ENCOUNTER — Telehealth: Payer: Self-pay | Admitting: Internal Medicine

## 2018-11-07 NOTE — Telephone Encounter (Signed)
Pt. [facility] ref[facility] per his wife.[facility] pharmacy notified.

## 2018-11-07 NOTE — Telephone Encounter (Signed)
Copied from [place] [id number]. Topic: Quick Communication - Rx Refill/Question >> Nov 07, 2018  9:31 AM [name] wrote: Medication: gabapentin (NEURONTIN) 100 MG capsule  Has the patient contacted their pharmacy? Yes ( Donald [name] is calling to get this script, patient was given 90 tablets on 2/4 ) can someone follow up with the patient to see if he ever picked it up?  (Agent: If no, request that the patient contact the pharmacy for the refill.) (Agent: If yes, when and what did the pharmacy advise?)  Preferred Pharmacy (with phone number or street name): [facility] [place] [address] Phone: [phone] Fax: [phone]    Agent: Please be advised that RX refills may take up to 3 business days. We ask that you follow-up with your pharmacy.

## 2018-11-17 DIAGNOSIS — C44629 Squamous cell carcinoma of skin of left upper limb, including shoulder: Secondary | ICD-10-CM | POA: Diagnosis not present

## 2018-11-17 DIAGNOSIS — D0462 Carcinoma in situ of skin of left upper limb, including shoulder: Secondary | ICD-10-CM | POA: Diagnosis not present

## 2018-12-15 ENCOUNTER — Ambulatory Visit: Payer: Medicare HMO

## 2018-12-31 ENCOUNTER — Other Ambulatory Visit: Payer: Self-pay | Admitting: Internal Medicine

## 2019-02-03 ENCOUNTER — Other Ambulatory Visit: Payer: Medicare HMO

## 2019-02-05 ENCOUNTER — Encounter: Payer: Medicare HMO | Admitting: Internal Medicine

## 2019-02-13 ENCOUNTER — Ambulatory Visit (INDEPENDENT_AMBULATORY_CARE_PROVIDER_SITE_OTHER): Payer: Medicare HMO

## 2019-02-13 ENCOUNTER — Other Ambulatory Visit: Payer: Self-pay

## 2019-02-13 DIAGNOSIS — Z0000 Encounter for general adult medical examination without abnormal findings: Secondary | ICD-10-CM

## 2019-02-13 NOTE — Patient Instructions (Addendum)
  [name] , Thank you for taking time to come for your Medicare Wellness Visit. I appreciate your ongoing commitment to your health goals. Please review the following plan we discussed and let me know if I can assist you in the future.   These are the goals we discussed: Goals    . DIET - REDUCE SUGAR INTAKE     Low carb diet       This is a list of the screening recommended for you and due dates:  Health Maintenance  Topic Date Due  . Complete foot exam   11/30/2016  . Pneumonia vaccines (2 of 2 - PPSV23) 12/04/2017  . Eye exam for diabetics  08/27/2018  . Urine Protein Check  01/28/2019  . Hemoglobin A1C  01/30/2019  . Tetanus Vaccine  06/19/2021  . Colon Cancer Screening  02/09/2024  .  Hepatitis C: One time screening is recommended by Center for Disease Control  (CDC) for  adults born from [birth year] through 1963.   Completed  . Flu Shot  Discontinued

## 2019-02-13 NOTE — Progress Notes (Signed)
Subjective:   Donald [name] is a 73 y.o. male who presents for Medicare Annual/Subsequent preventive examination.  Review of Systems:  No ROS.  Medicare Wellness Virtual Visit.  Visual/audio telehealth visit, UTA vital signs.   See social history for additional risk factors.   Cardiac Risk Factors include: advanced age (>30men, >39 women)     Objective:    Vitals: There were no vitals taken for this visit.  There is no height or weight on file to calculate BMI.  Advanced Directives 02/13/2019 12/13/2017 12/12/2016 12/13/2015  Does Patient Have a Medical Advance Directive? No No No No  Does patient want to make changes to medical advance directive? - - Yes (MAU/Ambulatory/Procedural Areas - Information given) -  Would patient like information on creating a medical advance directive? Yes (MAU/Ambulatory/Procedural Areas - Information given) No - Patient declined - No - patient declined information    Tobacco Social History   Tobacco Use  Smoking Status Former Smoker  Smokeless Tobacco Never Used     Counseling given: Not Answered   Clinical Intake:  Pre-visit preparation completed: Yes        Diabetes: Yes(Followed by pcp)  How often do you need to have someone help you when you read instructions, pamphlets, or other written materials from your doctor or pharmacy?: 1 - Never  Interpreter Needed?: No     Past Medical History:  Diagnosis Date  . Carpal tunnel syndrome   . Diabetes mellitus without complication ([place])   . Nephrolithiasis    H/O stones  . Rheumatoid arthritis [facility])    Past Surgical History:  Procedure Laterality Date  . APPENDECTOMY  1962  . CARPAL TUNNEL RELEASE Bilateral    hands   Family History  Problem Relation Age of Onset  . Heart disease Father   . Hypertension Father   . Diabetes Maternal Grandmother    Social History   Socioeconomic History  . Marital status: Married    Spouse name: Not on file  . Number of children: Not on file   . Years of education: Not on file  . Highest education level: Not on file  Occupational History  . Not on file  Social Needs  . Financial resource strain: Not hard at all  . Food insecurity:    Worry: Never true    Inability: Never true  . Transportation needs:    Medical: No    Non-medical: No  Tobacco Use  . Smoking status: Former Research scientist (life sciences)  . Smokeless tobacco: Never Used  Substance and Sexual Activity  . Alcohol use: No    Alcohol/week: 0.0 standard drinks  . Drug use: No  . Sexual activity: Not Currently  Lifestyle  . Physical activity:    Days per week: Not on file    Minutes per session: Not on file  . Stress: Not on file  Relationships  . Social connections:    Talks on phone: Not on file    Gets together: Not on file    Attends religious service: Not on file    Active member of club or organization: Not on file    Attends meetings of clubs or organizations: Not on file    Relationship status: Not on file  Other Topics Concern  . Not on file  Social History Narrative  . Not on file    Outpatient Encounter Medications as of 02/13/2019  Medication Sig  . ACCU-CHEK AVIVA PLUS test strip TEST TWO TIMES DAILY  . ACCU-CHEK SOFTCLIX  LANCETS lancets TEST TWO TIMES DAILY  . cetirizine (ZYRTEC) 10 MG tablet Take 10 mg by mouth as needed for allergies.  Marland Kitchen gabapentin (NEURONTIN) 100 MG capsule Take 1 capsule (100 mg total) by mouth at bedtime.  . hydroxychloroquine (PLAQUENIL) 200 MG tablet Take 200 mg by mouth daily.  . rosuvastatin (CRESTOR) 5 MG tablet TAKE 1 TABLET BY MOUTH EVERY DAY   No facility-administered encounter medications on file as of 02/13/2019.     Activities of Daily Living In your present state of health, do you have any difficulty performing the following activities: 02/13/2019  Hearing? N  Vision? N  Difficulty concentrating or making decisions? N  Walking or climbing stairs? N  Dressing or bathing? N  Doing errands, shopping? N  Preparing Food and  eating ? N  Using the Toilet? N  In the past six months, have you accidently leaked urine? N  Do you have problems with loss of bowel control? N  Managing your Medications? N  Managing your Finances? N  Housekeeping or managing your Housekeeping? N  Some recent data might be hidden    Patient Care Team: Einar Pheasant, MD as PCP - General (Internal Medicine)   Assessment:   This is a routine wellness examination for Donald [name].  I connected with patient 02/13/19 at 11:30 AM EDT by a video/audio enabled telemedicine application and verified that I am speaking with the correct person using two identifiers. Patient stated full name and DOB. Patient gave permission to continue with virtual visit. Patient's location was at home and Nurse's location was at [place] office.   Health Screenings  Colonoscopy - 02/2014 Glaucoma -none Hearing -demonstrates normal hearing during visit. Hemoglobin A1C - 07/2018 Cholesterol - 07/2018 Dental- every 6 months Vision- visits within the last 12 months.  Social  Alcohol intake - no        Smoking history- former   Smokers in home? none Illicit drug use? none Exercise - stays active around the home  Diet - regular diet; portion control Sexually Active -not currently BMI- discussed the importance of a healthy diet, water intake and the benefits of aerobic exercise.  Educational material provided.   Safety  Patient feels safe at home- yes Patient does have smoke detectors at home- yes Patient does wear sunscreen or protective clothing when in direct sunlight -yes Patient does wear seat belt when in a moving vehicle -yes  Covid-19 precautions and sickness symptoms discussed.   Activities of Daily Living Patient denies needing assistance with: driving, household chores, feeding themselves, getting from bed to chair, getting to the toilet, bathing/showering, dressing, managing money, or preparing meals.  No new identified risk were noted.     Depression Screen Patient denies losing interest in daily life, feeling hopeless, or crying easily over simple problems.   Medication-taking as directed and without issues.   Fall Screen Patient denies being afraid of falling or falling in the last year.   Memory Screen Patient is alert.  Patient denies difficulty focusing, concentrating or misplacing items. Correctly identified the president of the [place], season and recall. Patient likes to read for brain stimulation.  Immunizations The following Immunizations were discussed: Influenza, shingles, pneumonia, and tetanus.   Other Providers Patient Care Team: Einar Pheasant, MD as PCP - General (Internal Medicine)  Exercise Activities and Dietary recommendations Current Exercise Habits: Home exercise routine  Goals    . DIET - REDUCE SUGAR INTAKE     Low carb diet  Fall Risk Fall Risk  02/13/2019 05/30/2018 12/13/2017 12/12/2016 06/04/2016  Falls in the past year? 0 No No No No  Comment - - - - -  Number falls in past yr: - - - - -  Injury with Fall? - - - - -   Depression Screen PHQ 2/9 Scores 02/13/2019 05/30/2018 12/13/2017 12/12/2016  PHQ - 2 Score 0 0 0 0  Exception Documentation - - - -    Cognitive Function MMSE - Mini Mental State Exam 12/13/2017 12/12/2016 12/13/2015  Orientation to time 5 5 5   Orientation to Place 5 5 5   Registration 3 3 3   Attention/ Calculation 5 5 5   Recall 1 3 3   Language- name 2 objects 2 2 2   Language- repeat 1 1 1   Language- follow 3 step command 3 3 3   Language- read & follow direction 1 1 1   Write a sentence 1 1 1   Copy design 1 1 1   Total score 28 30 30      6CIT Screen 02/13/2019  What Year? 0 points  What month? 0 points  What time? 0 points  Count back from 20 0 points  Months in reverse 0 points  Repeat phrase 0 points  Total Score 0    Immunization History  Administered Date(s) Administered  . Pneumococcal Conjugate-13 06/04/2016  . Pneumococcal Polysaccharide-23 12/04/2012   . Td 05/14/2011  . Zoster 04/04/2012, 06/19/2013   Screening Tests Health Maintenance  Topic Date Due  . FOOT EXAM  11/30/2016  . PNA vac Low Risk Adult (2 of 2 - PPSV23) 12/04/2017  . OPHTHALMOLOGY EXAM  08/27/2018  . URINE MICROALBUMIN  01/28/2019  . HEMOGLOBIN A1C  01/30/2019  . TETANUS/TDAP  06/19/2021  . COLONOSCOPY  02/09/2024  . Hepatitis C Screening  Completed  . INFLUENZA VACCINE  Discontinued      Plan:    End of life planning; Advance aging; Advanced directives discussed.  Copy of current HCPOA/Living Will requested upon completion.    I have personally reviewed and noted the following in the patient's chart:   . Medical and social history . Use of alcohol, tobacco or illicit drugs  . Current medications and supplements . Functional ability and status . Nutritional status . Physical activity . Advanced directives . List of other physicians . Hospitalizations, surgeries, and ER visits in previous 12 months . Vitals . Screenings to include cognitive, depression, and falls . Referrals and appointments  In addition, I have reviewed and discussed with patient certain preventive protocols, quality metrics, and best practice recommendations. A written personalized care plan for preventive services as well as general preventive health recommendations were provided to patient.     Varney Biles, LPN  12/11/5684   Reviewed above information.  Agree with assessment and plan.    Dr Nicki Reaper

## 2019-02-17 ENCOUNTER — Telehealth: Payer: Self-pay

## 2019-02-17 NOTE — Telephone Encounter (Signed)
Patient requests Crestor and Gabapentin refills be sent to mail order [facility] going forward.  [facility] Not yet ready for refill but requests this change be made.

## 2019-02-18 ENCOUNTER — Other Ambulatory Visit: Payer: Self-pay

## 2019-02-18 MED ORDER — ROSUVASTATIN CALCIUM 5 MG PO TABS: 5.0000 mg | ORAL_TABLET | Freq: Every day | ORAL | 1 refills | Status: DC

## 2019-02-18 MED ORDER — GABAPENTIN 100 MG PO CAPS: 100.0000 mg | ORAL_CAPSULE | Freq: Every day | ORAL | 1 refills | Status: DC

## 2019-02-18 NOTE — Telephone Encounter (Signed)
rx sent in to [facility]

## 2019-02-20 ENCOUNTER — Other Ambulatory Visit: Payer: Medicare HMO

## 2019-02-26 ENCOUNTER — Other Ambulatory Visit: Payer: Self-pay | Admitting: Internal Medicine

## 2019-03-02 DIAGNOSIS — M0579 Rheumatoid arthritis with rheumatoid factor of multiple sites without organ or systems involvement: Secondary | ICD-10-CM | POA: Diagnosis not present

## 2019-03-02 DIAGNOSIS — E119 Type 2 diabetes mellitus without complications: Secondary | ICD-10-CM | POA: Diagnosis not present

## 2019-03-02 DIAGNOSIS — Z79899 Other long term (current) drug therapy: Secondary | ICD-10-CM | POA: Diagnosis not present

## 2019-03-19 DIAGNOSIS — D225 Melanocytic nevi of trunk: Secondary | ICD-10-CM | POA: Diagnosis not present

## 2019-03-19 DIAGNOSIS — Z85828 Personal history of other malignant neoplasm of skin: Secondary | ICD-10-CM | POA: Diagnosis not present

## 2019-03-19 DIAGNOSIS — D2262 Melanocytic nevi of left upper limb, including shoulder: Secondary | ICD-10-CM | POA: Diagnosis not present

## 2019-03-19 DIAGNOSIS — R208 Other disturbances of skin sensation: Secondary | ICD-10-CM | POA: Diagnosis not present

## 2019-03-19 DIAGNOSIS — L820 Inflamed seborrheic keratosis: Secondary | ICD-10-CM | POA: Diagnosis not present

## 2019-03-19 DIAGNOSIS — L570 Actinic keratosis: Secondary | ICD-10-CM | POA: Diagnosis not present

## 2019-03-19 DIAGNOSIS — D2272 Melanocytic nevi of left lower limb, including hip: Secondary | ICD-10-CM | POA: Diagnosis not present

## 2019-03-19 DIAGNOSIS — L538 Other specified erythematous conditions: Secondary | ICD-10-CM | POA: Diagnosis not present

## 2019-03-23 DIAGNOSIS — E119 Type 2 diabetes mellitus without complications: Secondary | ICD-10-CM | POA: Diagnosis not present

## 2019-03-23 DIAGNOSIS — M0579 Rheumatoid arthritis with rheumatoid factor of multiple sites without organ or systems involvement: Secondary | ICD-10-CM | POA: Diagnosis not present

## 2019-03-23 DIAGNOSIS — Z79899 Other long term (current) drug therapy: Secondary | ICD-10-CM | POA: Diagnosis not present

## 2019-04-08 ENCOUNTER — Other Ambulatory Visit: Payer: Self-pay | Admitting: Internal Medicine

## 2019-04-27 DIAGNOSIS — M0579 Rheumatoid arthritis with rheumatoid factor of multiple sites without organ or systems involvement: Secondary | ICD-10-CM | POA: Diagnosis not present

## 2019-04-27 DIAGNOSIS — Z79899 Other long term (current) drug therapy: Secondary | ICD-10-CM | POA: Diagnosis not present

## 2019-05-01 ENCOUNTER — Telehealth: Payer: Self-pay | Admitting: *Deleted

## 2019-05-01 DIAGNOSIS — E7800 Pure hypercholesterolemia, unspecified: Secondary | ICD-10-CM

## 2019-05-01 DIAGNOSIS — Z125 Encounter for screening for malignant neoplasm of prostate: Secondary | ICD-10-CM

## 2019-05-01 DIAGNOSIS — E119 Type 2 diabetes mellitus without complications: Secondary | ICD-10-CM

## 2019-05-01 DIAGNOSIS — M069 Rheumatoid arthritis, unspecified: Secondary | ICD-10-CM

## 2019-05-01 NOTE — Telephone Encounter (Signed)
Please place future orders for lab appt.  

## 2019-05-02 NOTE — Telephone Encounter (Signed)
Order placed for f/u labs.  

## 2019-05-04 DIAGNOSIS — Z79899 Other long term (current) drug therapy: Secondary | ICD-10-CM | POA: Diagnosis not present

## 2019-05-04 DIAGNOSIS — M0579 Rheumatoid arthritis with rheumatoid factor of multiple sites without organ or systems involvement: Secondary | ICD-10-CM | POA: Diagnosis not present

## 2019-05-04 DIAGNOSIS — E119 Type 2 diabetes mellitus without complications: Secondary | ICD-10-CM | POA: Diagnosis not present

## 2019-05-05 ENCOUNTER — Other Ambulatory Visit: Payer: Self-pay

## 2019-05-05 ENCOUNTER — Other Ambulatory Visit (INDEPENDENT_AMBULATORY_CARE_PROVIDER_SITE_OTHER): Payer: Medicare HMO

## 2019-05-05 DIAGNOSIS — M069 Rheumatoid arthritis, unspecified: Secondary | ICD-10-CM

## 2019-05-05 DIAGNOSIS — Z125 Encounter for screening for malignant neoplasm of prostate: Secondary | ICD-10-CM

## 2019-05-05 DIAGNOSIS — E7800 Pure hypercholesterolemia, unspecified: Secondary | ICD-10-CM | POA: Diagnosis not present

## 2019-05-05 DIAGNOSIS — E119 Type 2 diabetes mellitus without complications: Secondary | ICD-10-CM

## 2019-05-05 LAB — LIPID PANEL
Cholesterol: 135 mg/dL (ref 0–200)
HDL: 41.1 mg/dL (ref >39.00)
LDL Cholesterol: 66 mg/dL (ref 0–99)
NonHDL: 94.33
Total CHOL/HDL Ratio: 3
Triglycerides: 144 mg/dL (ref 0.0–149.0)
VLDL: 28.8 mg/dL (ref 0.0–40.0)

## 2019-05-05 LAB — BASIC METABOLIC PANEL
BUN: 23 mg/dL (ref 6–23)
CO2: 23 mEq/L (ref 19–32)
Calcium: 9 mg/dL (ref 8.4–10.5)
Chloride: 106 mEq/L (ref 96–112)
Creatinine, Ser: 1.17 mg/dL (ref 0.40–1.50)
GFR: 61.41 mL/min (ref >60.00)
Glucose, Bld: 124 mg/dL — ABNORMAL HIGH (ref 70–99)
Potassium: 4.1 mEq/L (ref 3.5–5.1)
Sodium: 139 mEq/L (ref 135–145)

## 2019-05-05 LAB — CBC WITH DIFFERENTIAL/PLATELET
Basophils Absolute: 0.1 10*3/uL (ref 0.0–0.1)
Basophils Relative: 1.1 % (ref 0.0–3.0)
Eosinophils Absolute: 0.3 10*3/uL (ref 0.0–0.7)
Eosinophils Relative: 6 % — ABNORMAL HIGH (ref 0.0–5.0)
HCT: 41.8 % (ref 39.0–52.0)
Hemoglobin: 14.5 g/dL (ref 13.0–17.0)
Lymphocytes Relative: 22.5 % (ref 12.0–46.0)
Lymphs Abs: 1.2 10*3/uL (ref 0.7–4.0)
MCHC: 34.7 g/dL (ref 30.0–36.0)
MCV: 98.9 fl (ref 78.0–100.0)
Monocytes Absolute: 0.6 10*3/uL (ref 0.1–1.0)
Monocytes Relative: 12.1 % — ABNORMAL HIGH (ref 3.0–12.0)
Neutro Abs: 3.1 10*3/uL (ref 1.4–7.7)
Neutrophils Relative %: 58.3 % (ref 43.0–77.0)
Platelets: 208 10*3/uL (ref 150.0–400.0)
RBC: 4.23 Mil/uL (ref 4.22–5.81)
RDW: 12.5 % (ref 11.5–15.5)
WBC: 5.3 10*3/uL (ref 4.0–10.5)

## 2019-05-05 LAB — HEPATIC FUNCTION PANEL
ALT: 20 U/L (ref 0–53)
AST: 20 U/L (ref 0–37)
Albumin: 4.6 g/dL (ref 3.5–5.2)
Alkaline Phosphatase: 53 U/L (ref 39–117)
Bilirubin, Direct: 0.1 mg/dL (ref 0.0–0.3)
Total Bilirubin: 0.7 mg/dL (ref 0.2–1.2)
Total Protein: 6.8 g/dL (ref 6.0–8.3)

## 2019-05-05 LAB — HEMOGLOBIN A1C: Hgb A1c MFr Bld: 6.5 % (ref 4.6–6.5)

## 2019-05-05 LAB — PSA, MEDICARE: PSA: 0.95 ng/ml (ref 0.10–4.00)

## 2019-05-05 LAB — TSH: TSH: 0.91 u[IU]/mL (ref 0.35–4.50)

## 2019-05-12 ENCOUNTER — Other Ambulatory Visit: Payer: Self-pay

## 2019-05-12 ENCOUNTER — Encounter: Payer: Self-pay | Admitting: Internal Medicine

## 2019-05-12 ENCOUNTER — Ambulatory Visit (INDEPENDENT_AMBULATORY_CARE_PROVIDER_SITE_OTHER): Payer: Medicare HMO | Admitting: Internal Medicine

## 2019-05-12 VITALS — BP 132/78 | HR 82 | Temp 97.8°F | Ht 69.75 in | Wt 192.4 lb

## 2019-05-12 DIAGNOSIS — M069 Rheumatoid arthritis, unspecified: Secondary | ICD-10-CM | POA: Diagnosis not present

## 2019-05-12 DIAGNOSIS — I350 Nonrheumatic aortic (valve) stenosis: Secondary | ICD-10-CM | POA: Diagnosis not present

## 2019-05-12 DIAGNOSIS — Z0000 Encounter for general adult medical examination without abnormal findings: Secondary | ICD-10-CM | POA: Diagnosis not present

## 2019-05-12 DIAGNOSIS — E7800 Pure hypercholesterolemia, unspecified: Secondary | ICD-10-CM

## 2019-05-12 DIAGNOSIS — E119 Type 2 diabetes mellitus without complications: Secondary | ICD-10-CM

## 2019-05-12 LAB — HM DIABETES FOOT EXAM

## 2019-05-12 NOTE — Progress Notes (Addendum)
Patient ID: Donald [name], male   DOB: 06-01-1946, 73 y.o.   MRN: [id number]   Subjective:    Patient ID: Donald [name], male    DOB: 1946/07/12, 73 y.o.   MRN: [id number]  HPI  Patient here for his physical exam.  Sees Dr [name] for his RA.  Was on plaquenil.  Off now.  On leflunomide.  Joints doing well.  No increased pain or stiffness.  Stays active.  No chest pain.  No sob.  No acid reflux. No abdominal pain.  Bowels moving.  Had echo.  Mild AS.  Discussed f/u echo to follow valve.  Also discussed f/u with cardiology - to follow.  He wants to hold at this time.  Discussed recent labs.  On crestor.     Past Medical History:  Diagnosis Date  . Carpal tunnel syndrome   . Diabetes mellitus without complication ([place])   . Nephrolithiasis    H/O stones  . Rheumatoid arthritis [facility])    Past Surgical History:  Procedure Laterality Date  . APPENDECTOMY  1962  . CARPAL TUNNEL RELEASE Bilateral    hands   Family History  Problem Relation Age of Onset  . Heart disease Father   . Hypertension Father   . Diabetes Maternal Grandmother    Social History   Socioeconomic History  . Marital status: Married    Spouse name: Not on file  . Number of children: Not on file  . Years of education: Not on file  . Highest education level: Not on file  Occupational History  . Not on file  Social Needs  . Financial resource strain: Not hard at all  . Food insecurity    Worry: Never true    Inability: Never true  . Transportation needs    Medical: No    Non-medical: No  Tobacco Use  . Smoking status: Former Research scientist (life sciences)  . Smokeless tobacco: Never Used  Substance and Sexual Activity  . Alcohol use: No    Alcohol/week: 0.0 standard drinks  . Drug use: No  . Sexual activity: Not Currently  Lifestyle  . Physical activity    Days per week: Not on file    Minutes per session: Not on file  . Stress: Not on file  Relationships  . Social Herbalist on phone: Not on file    Gets  together: Not on file    Attends religious service: Not on file    Active member of club or organization: Not on file    Attends meetings of clubs or organizations: Not on file    Relationship status: Not on file  Other Topics Concern  . Not on file  Social History Narrative  . Not on file    Outpatient Encounter Medications as of 05/12/2019  Medication Sig  . ACCU-CHEK AVIVA PLUS test strip TEST TWO TIMES DAILY  . ACCU-CHEK SOFTCLIX LANCETS lancets TEST TWO TIMES DAILY  . cetirizine (ZYRTEC) 10 MG tablet Take 10 mg by mouth as needed for allergies.  Marland Kitchen gabapentin (NEURONTIN) 100 MG capsule TAKE 1 CAPSULE (100 MG TOTAL) BY MOUTH AT BEDTIME.  Marland Kitchen leflunomide (ARAVA) 10 MG tablet Take 10 mg by mouth daily.   . rosuvastatin (CRESTOR) 5 MG tablet Take 1 tablet (5 mg total) by mouth daily.  . [DISCONTINUED] hydroxychloroquine (PLAQUENIL) 200 MG tablet Take 200 mg by mouth daily.   No facility-administered encounter medications on file as of 05/12/2019.     Review of Systems  Constitutional: Negative for appetite change and unexpected weight change.  HENT: Negative for congestion and sinus pressure.   Eyes: Negative for pain and visual disturbance.  Respiratory: Negative for cough, chest tightness and shortness of breath.   Cardiovascular: Negative for chest pain, palpitations and leg swelling.  Gastrointestinal: Negative for abdominal pain, diarrhea, nausea and vomiting.  Genitourinary: Negative for difficulty urinating and dysuria.  Musculoskeletal: Negative for joint swelling and myalgias.  Skin: Negative for color change and rash.  Neurological: Negative for dizziness, light-headedness and headaches.  Hematological: Negative for adenopathy. Does not bruise/bleed easily.  Psychiatric/Behavioral: Negative for agitation and dysphoric mood.       Objective:    Physical Exam Constitutional:      Appearance: Normal appearance. He is well-developed.  HENT:     Head: Normocephalic and  atraumatic.     Right Ear: External ear normal.     Left Ear: External ear normal.     Mouth/Throat:     Pharynx: No oropharyngeal exudate.  Eyes:     General: No scleral icterus.       Right eye: No discharge.        Left eye: No discharge.     Conjunctiva/sclera: Conjunctivae normal.  Neck:     Musculoskeletal: Neck supple. No muscular tenderness.     Thyroid: No thyromegaly.  Cardiovascular:     Rate and Rhythm: Normal rate and regular rhythm.  Pulmonary:     Effort: No respiratory distress.     Breath sounds: Normal breath sounds. No wheezing.  Abdominal:     General: Bowel sounds are normal.     Palpations: Abdomen is soft.     Tenderness: There is no abdominal tenderness.  Musculoskeletal:        General: No swelling or tenderness.  Lymphadenopathy:     Cervical: No cervical adenopathy.  Skin:    Findings: No erythema or rash.  Neurological:     Mental Status: He is alert and oriented to person, place, and time.  Psychiatric:        Mood and Affect: Mood normal.        Behavior: Behavior normal.     BP 132/78   Pulse 82   Temp 97.8 F (36.6 C) (Temporal)   Ht 5' 9.75" (1.772 m)   Wt 192 lb 6.4 oz (87.3 kg)   SpO2 97%   BMI 27.80 kg/m  Wt Readings from Last 3 Encounters:  05/12/19 192 lb 6.4 oz (87.3 kg)  08/05/18 191 lb (86.6 kg)  06/13/18 191 lb (86.6 kg)     Lab Results  Component Value Date   WBC 5.3 05/05/2019   HGB 14.5 05/05/2019   HCT 41.8 05/05/2019   PLT 208.0 05/05/2019   GLUCOSE 124 (H) 05/05/2019   CHOL 135 05/05/2019   TRIG 144.0 05/05/2019   HDL 41.10 05/05/2019   LDLCALC 66 05/05/2019   ALT 20 05/05/2019   AST 20 05/05/2019   NA 139 05/05/2019   K 4.1 05/05/2019   CL 106 05/05/2019   CREATININE 1.17 05/05/2019   BUN 23 05/05/2019   CO2 23 05/05/2019   TSH 0.91 05/05/2019   PSA 0.95 05/05/2019   HGBA1C 6.5 05/05/2019   MICROALBUR 0.9 01/27/2018         Assessment & Plan:   Problem List Items Addressed This Visit     Aortic stenosis    Mild AS noted on previous ECHO.  Currently asymptomatic.  Discussed further evaluation and follow up.  He declines.  Declines f/u echo and f/u with cardiology.        Diabetes ([place])    Low carb diet and exercise.  Recent a1c 6.5.  Follow met b and a1c.        Relevant Orders   Hemoglobin J6L   Basic metabolic panel   Microalbumin / creatinine urine ratio   Health care maintenance    Physical today 05/12/19.  Colonoscopy 02/08/14- internal hemorrhoids.  PSA 05/05/19 - .98.        Hypercholesterolemia    On crestor.  Low cholesterol diet and exercise.  Follow lipid panel and liver function tests.        Relevant Orders   Hepatic function panel   Lipid panel   Rheumatoid arthritis ([place])    Followed by Dr [name].        Relevant Medications   leflunomide (ARAVA) 10 MG tablet    Other Visit Diagnoses    Routine general medical examination at a health care facility    -  Primary       Einar Pheasant, MD

## 2019-05-17 ENCOUNTER — Encounter: Payer: Self-pay | Admitting: Internal Medicine

## 2019-05-17 NOTE — Assessment & Plan Note (Signed)
Physical today 05/12/19.  Colonoscopy 02/08/14- internal hemorrhoids.  PSA 05/05/19 - .98.

## 2019-05-17 NOTE — Assessment & Plan Note (Signed)
Low carb diet and exercise.  Recent a1c 6.5.  Follow met b and a1c.

## 2019-05-17 NOTE — Assessment & Plan Note (Signed)
Followed by Dr Kernodle.   

## 2019-05-17 NOTE — Assessment & Plan Note (Signed)
Mild AS noted on previous ECHO.  Currently asymptomatic.  Discussed further evaluation and follow up.  He declines.  Declines f/u echo and f/u with cardiology.

## 2019-05-17 NOTE — Assessment & Plan Note (Signed)
On crestor.  Low cholesterol diet and exercise.  Follow lipid panel and liver function tests.   

## 2019-08-04 DIAGNOSIS — Z79899 Other long term (current) drug therapy: Secondary | ICD-10-CM | POA: Diagnosis not present

## 2019-08-04 DIAGNOSIS — M0579 Rheumatoid arthritis with rheumatoid factor of multiple sites without organ or systems involvement: Secondary | ICD-10-CM | POA: Diagnosis not present

## 2019-10-19 ENCOUNTER — Other Ambulatory Visit: Payer: Self-pay | Admitting: Internal Medicine

## 2019-10-20 DIAGNOSIS — D2261 Melanocytic nevi of right upper limb, including shoulder: Secondary | ICD-10-CM | POA: Diagnosis not present

## 2019-10-20 DIAGNOSIS — L821 Other seborrheic keratosis: Secondary | ICD-10-CM | POA: Diagnosis not present

## 2019-10-20 DIAGNOSIS — D2262 Melanocytic nevi of left upper limb, including shoulder: Secondary | ICD-10-CM | POA: Diagnosis not present

## 2019-10-20 DIAGNOSIS — Z85828 Personal history of other malignant neoplasm of skin: Secondary | ICD-10-CM | POA: Diagnosis not present

## 2019-10-20 DIAGNOSIS — L570 Actinic keratosis: Secondary | ICD-10-CM | POA: Diagnosis not present

## 2019-10-20 DIAGNOSIS — D485 Neoplasm of uncertain behavior of skin: Secondary | ICD-10-CM | POA: Diagnosis not present

## 2019-10-20 DIAGNOSIS — C44529 Squamous cell carcinoma of skin of other part of trunk: Secondary | ICD-10-CM | POA: Diagnosis not present

## 2019-10-20 DIAGNOSIS — X32XXXA Exposure to sunlight, initial encounter: Secondary | ICD-10-CM | POA: Diagnosis not present

## 2019-10-20 DIAGNOSIS — D2271 Melanocytic nevi of right lower limb, including hip: Secondary | ICD-10-CM | POA: Diagnosis not present

## 2019-10-20 DIAGNOSIS — D225 Melanocytic nevi of trunk: Secondary | ICD-10-CM | POA: Diagnosis not present

## 2019-10-28 DIAGNOSIS — M0579 Rheumatoid arthritis with rheumatoid factor of multiple sites without organ or systems involvement: Secondary | ICD-10-CM | POA: Diagnosis not present

## 2019-10-28 DIAGNOSIS — Z79899 Other long term (current) drug therapy: Secondary | ICD-10-CM | POA: Diagnosis not present

## 2019-11-03 DIAGNOSIS — M0579 Rheumatoid arthritis with rheumatoid factor of multiple sites without organ or systems involvement: Secondary | ICD-10-CM | POA: Diagnosis not present

## 2019-11-03 DIAGNOSIS — E119 Type 2 diabetes mellitus without complications: Secondary | ICD-10-CM | POA: Diagnosis not present

## 2019-11-03 DIAGNOSIS — Z79899 Other long term (current) drug therapy: Secondary | ICD-10-CM | POA: Diagnosis not present

## 2019-11-12 DIAGNOSIS — C44529 Squamous cell carcinoma of skin of other part of trunk: Secondary | ICD-10-CM | POA: Diagnosis not present

## 2019-11-17 ENCOUNTER — Other Ambulatory Visit: Payer: Self-pay

## 2019-11-17 ENCOUNTER — Other Ambulatory Visit (INDEPENDENT_AMBULATORY_CARE_PROVIDER_SITE_OTHER): Payer: Medicare HMO

## 2019-11-17 DIAGNOSIS — E119 Type 2 diabetes mellitus without complications: Secondary | ICD-10-CM

## 2019-11-17 DIAGNOSIS — E7800 Pure hypercholesterolemia, unspecified: Secondary | ICD-10-CM | POA: Diagnosis not present

## 2019-11-17 LAB — BASIC METABOLIC PANEL
BUN: 25 mg/dL — ABNORMAL HIGH (ref 6–23)
CO2: 25 mEq/L (ref 19–32)
Calcium: 9.2 mg/dL (ref 8.4–10.5)
Chloride: 105 mEq/L (ref 96–112)
Creatinine, Ser: 1.15 mg/dL (ref 0.40–1.50)
GFR: 62.55 mL/min (ref >60.00)
Glucose, Bld: 124 mg/dL — ABNORMAL HIGH (ref 70–99)
Potassium: 4.2 mEq/L (ref 3.5–5.1)
Sodium: 138 mEq/L (ref 135–145)

## 2019-11-17 LAB — LIPID PANEL
Cholesterol: 140 mg/dL (ref 0–200)
HDL: 42.2 mg/dL (ref >39.00)
LDL Cholesterol: 63 mg/dL (ref 0–99)
NonHDL: 97.31
Total CHOL/HDL Ratio: 3
Triglycerides: 173 mg/dL — ABNORMAL HIGH (ref 0.0–149.0)
VLDL: 34.6 mg/dL (ref 0.0–40.0)

## 2019-11-17 LAB — MICROALBUMIN / CREATININE URINE RATIO
Creatinine,U: 218.3 mg/dL
Microalb Creat Ratio: 0.6 mg/g (ref 0.0–30.0)
Microalb, Ur: 1.3 mg/dL (ref 0.0–1.9)

## 2019-11-17 LAB — HEPATIC FUNCTION PANEL
ALT: 20 U/L (ref 0–53)
AST: 21 U/L (ref 0–37)
Albumin: 4.4 g/dL (ref 3.5–5.2)
Alkaline Phosphatase: 58 U/L (ref 39–117)
Bilirubin, Direct: 0.1 mg/dL (ref 0.0–0.3)
Total Bilirubin: 0.7 mg/dL (ref 0.2–1.2)
Total Protein: 7.2 g/dL (ref 6.0–8.3)

## 2019-11-17 LAB — HEMOGLOBIN A1C: Hgb A1c MFr Bld: 6.4 % (ref 4.6–6.5)

## 2019-11-19 ENCOUNTER — Ambulatory Visit (INDEPENDENT_AMBULATORY_CARE_PROVIDER_SITE_OTHER): Payer: Medicare HMO | Admitting: Internal Medicine

## 2019-11-19 ENCOUNTER — Encounter: Payer: Self-pay | Admitting: Internal Medicine

## 2019-11-19 ENCOUNTER — Other Ambulatory Visit: Payer: Self-pay

## 2019-11-19 VITALS — BP 122/70 | HR 81 | Temp 96.9°F | Resp 16 | Wt 185.2 lb

## 2019-11-19 DIAGNOSIS — I350 Nonrheumatic aortic (valve) stenosis: Secondary | ICD-10-CM

## 2019-11-19 DIAGNOSIS — Z125 Encounter for screening for malignant neoplasm of prostate: Secondary | ICD-10-CM | POA: Diagnosis not present

## 2019-11-19 DIAGNOSIS — E119 Type 2 diabetes mellitus without complications: Secondary | ICD-10-CM

## 2019-11-19 DIAGNOSIS — M069 Rheumatoid arthritis, unspecified: Secondary | ICD-10-CM

## 2019-11-19 DIAGNOSIS — E7800 Pure hypercholesterolemia, unspecified: Secondary | ICD-10-CM

## 2019-11-19 NOTE — Progress Notes (Signed)
Patient ID: Donald [name], male   DOB: [birth year], 74 y.o.   MRN: [id number]   Subjective:    Patient ID: Donald [name], male    DOB: [birth year], 74 y.o.   MRN: [id number]  HPI This visit occurred during the SARS-CoV-2 public health emergency.  Safety protocols were in place, including screening questions prior to the visit, additional usage of staff PPE, and extensive cleaning of exam room while observing appropriate contact time as indicated for disinfecting solutions.  Patient here for a scheduled follow up.  He reports he is doing well.  Feels good.  Stays active.  No chest pain.  No sob.  No acid reflux.  No abdominal pain.  Bowels moving.  No urine or bowel change.  Discussed covid vaccine.  He wants to hold at this time. Sugars doing well.  AM sugars averaging 120 and PM sugars 100-110.  Monitors diet.  Has lost weight.  Has been trying to lose weight.  Sees Dr [name] for his RA.  Off plaquenil.  On leflunomide.  Just evaluated 11/03/19.  Stable.     Past Medical History:  Diagnosis Date  . Carpal tunnel syndrome   . Diabetes mellitus without complication ([place])   . Nephrolithiasis    H/O stones  . Rheumatoid arthritis [facility])    Past Surgical History:  Procedure Laterality Date  . APPENDECTOMY  1962  . CARPAL TUNNEL RELEASE Bilateral    hands   Family History  Problem Relation Age of Onset  . Heart disease Father   . Hypertension Father   . Diabetes Maternal Grandmother    Social History   Socioeconomic History  . Marital status: Married    Spouse name: Not on file  . Number of children: Not on file  . Years of education: Not on file  . Highest education level: Not on file  Occupational History  . Not on file  Tobacco Use  . Smoking status: Former Research scientist (life sciences)  . Smokeless tobacco: Never Used  Substance and Sexual Activity  . Alcohol use: No    Alcohol/week: 0.0 standard drinks  . Drug use: No  . Sexual activity: Not Currently  Other Topics Concern  . Not on file   Social History Narrative  . Not on file   Social Determinants of Health   Financial Resource Strain:   . Difficulty of Paying Living Expenses:   Food Insecurity:   . Worried About Charity fundraiser in the Last Year:   . Arboriculturist in the Last Year:   Transportation Needs:   . Film/video editor (Medical):   Marland Kitchen Lack of Transportation (Non-Medical):   Physical Activity:   . Days of Exercise per Week:   . Minutes of Exercise per Session:   Stress:   . Feeling of Stress :   Social Connections:   . Frequency of Communication with Friends and Family:   . Frequency of Social Gatherings with Friends and Family:   . Attends Religious Services:   . Active Member of Clubs or Organizations:   . Attends Archivist Meetings:   Marland Kitchen Marital Status:     Outpatient Encounter Medications as of 11/19/2019  Medication Sig  . ACCU-CHEK AVIVA PLUS test strip TEST TWO TIMES DAILY  . ACCU-CHEK SOFTCLIX LANCETS lancets TEST TWO TIMES DAILY  . cetirizine (ZYRTEC) 10 MG tablet Take 10 mg by mouth as needed for allergies.  Marland Kitchen gabapentin (NEURONTIN) 100 MG capsule TAKE 1 CAPSULE  AT  BEDTIME.  Marland Kitchen leflunomide (ARAVA) 10 MG tablet Take 10 mg by mouth daily.   . rosuvastatin (CRESTOR) 5 MG tablet TAKE 1 TABLET EVERY DAY   No facility-administered encounter medications on file as of 11/19/2019.    Review of Systems  Constitutional: Negative for appetite change.       Has lost weight.  Has been trying to lose weight.   HENT: Negative for congestion and sinus pressure.   Respiratory: Negative for cough, chest tightness and shortness of breath.   Cardiovascular: Negative for chest pain, palpitations and leg swelling.  Gastrointestinal: Negative for abdominal pain, diarrhea, nausea and vomiting.  Genitourinary: Negative for difficulty urinating and dysuria.  Musculoskeletal: Negative for joint swelling and myalgias.  Skin: Negative for color change and rash.  Neurological: Negative for  dizziness, light-headedness and headaches.  Psychiatric/Behavioral: Negative for agitation and dysphoric mood.       Objective:    Physical Exam Constitutional:      General: He is not in acute distress.    Appearance: Normal appearance. He is well-developed.  HENT:     Head: Normocephalic and atraumatic.     Right Ear: External ear normal.     Left Ear: External ear normal.  Eyes:     General: No scleral icterus.       Right eye: No discharge.        Left eye: No discharge.     Conjunctiva/sclera: Conjunctivae normal.  Cardiovascular:     Rate and Rhythm: Normal rate and regular rhythm.  Pulmonary:     Effort: Pulmonary effort is normal. No respiratory distress.     Breath sounds: Normal breath sounds.  Abdominal:     General: Bowel sounds are normal.     Palpations: Abdomen is soft.     Tenderness: There is no abdominal tenderness.  Musculoskeletal:        General: No swelling or tenderness.     Cervical back: Neck supple. No tenderness.  Lymphadenopathy:     Cervical: No cervical adenopathy.  Skin:    Findings: No erythema or rash.  Neurological:     Mental Status: He is alert.  Psychiatric:        Mood and Affect: Mood normal.        Behavior: Behavior normal.     BP 122/70   Pulse 81   Temp (!) 96.9 F (36.1 C)   Resp 16   Wt 185 lb 3.2 oz (84 kg)   SpO2 97%   BMI 26.76 kg/m  Wt Readings from Last 3 Encounters:  11/19/19 185 lb 3.2 oz (84 kg)  05/12/19 192 lb 6.4 oz (87.3 kg)  08/05/18 191 lb (86.6 kg)     Lab Results  Component Value Date   WBC 5.3 05/05/2019   HGB 14.5 05/05/2019   HCT 41.8 05/05/2019   PLT 208.0 05/05/2019   GLUCOSE 124 (H) 11/17/2019   CHOL 140 11/17/2019   TRIG 173.0 (H) 11/17/2019   HDL 42.20 11/17/2019   LDLCALC 63 11/17/2019   ALT 20 11/17/2019   AST 21 11/17/2019   NA 138 11/17/2019   K 4.2 11/17/2019   CL 105 11/17/2019   CREATININE 1.15 11/17/2019   BUN 25 (H) 11/17/2019   CO2 25 11/17/2019   TSH 0.91  05/05/2019   PSA 0.95 05/05/2019   HGBA1C 6.4 11/17/2019   MICROALBUR 1.3 11/17/2019        Assessment & Plan:   Problem List Items Addressed This Visit  Aortic stenosis    Mild AS noted on previous ECHO.  Asymptomatic.  Discussed f/u echo.  He wants to hold on f/u.  Monitor for symptom change.        Diabetes ([place]) - Primary    Low carb diet and exercise.  Follow met b and a1c.  On no medication. Up to date with eye exams.        Relevant Orders   Hemoglobin Z2R   Basic metabolic panel   Hypercholesterolemia    On crestor.  Low cholesterol diet and exercise.  Follow lipid panel and liver function tests.        Relevant Orders   TSH   Lipid panel   Hepatic function panel   Rheumatoid arthritis ([place])    On lefunomide.  Followed by Dr [name].  Stable.        Relevant Orders   CBC with Differential/Platelet    Other Visit Diagnoses    Prostate cancer screening       Relevant Orders   PSA, Medicare       Einar Pheasant, MD

## 2019-11-23 ENCOUNTER — Encounter: Payer: Self-pay | Admitting: Internal Medicine

## 2019-11-23 NOTE — Assessment & Plan Note (Signed)
Low carb diet and exercise.  Follow met b and a1c.  On no medication. Up to date with eye exams.

## 2019-11-23 NOTE — Assessment & Plan Note (Signed)
On crestor.  Low cholesterol diet and exercise.  Follow lipid panel and liver function tests.   

## 2019-11-23 NOTE — Assessment & Plan Note (Signed)
Mild AS noted on previous ECHO.  Asymptomatic.  Discussed f/u echo.  He wants to hold on f/u.  Monitor for symptom change.

## 2019-11-23 NOTE — Assessment & Plan Note (Signed)
On lefunomide.  Followed by Dr [name].  Stable.

## 2020-02-01 DIAGNOSIS — Z79899 Other long term (current) drug therapy: Secondary | ICD-10-CM | POA: Diagnosis not present

## 2020-02-01 DIAGNOSIS — M0579 Rheumatoid arthritis with rheumatoid factor of multiple sites without organ or systems involvement: Secondary | ICD-10-CM | POA: Diagnosis not present

## 2020-02-15 ENCOUNTER — Ambulatory Visit: Payer: Medicare HMO

## 2020-02-23 ENCOUNTER — Ambulatory Visit (INDEPENDENT_AMBULATORY_CARE_PROVIDER_SITE_OTHER): Payer: Medicare HMO

## 2020-02-23 VITALS — Ht 69.75 in | Wt 185.0 lb

## 2020-02-23 DIAGNOSIS — Z0000 Encounter for general adult medical examination without abnormal findings: Secondary | ICD-10-CM

## 2020-02-23 NOTE — Patient Instructions (Addendum)
  Donald [name] , Thank you for taking time to come for your Medicare Wellness Visit. I appreciate your ongoing commitment to your health goals. Please review the following plan we discussed and let me know if I can assist you in the future.   These are the goals we discussed: Goals    . DIET - REDUCE SUGAR INTAKE     Low carb diet       This is a list of the screening recommended for you and due dates:  Health Maintenance  Topic Date Due  . Pneumonia vaccines (2 of 2 - PPSV23) 12/04/2017  . Eye exam for diabetics  08/27/2018  . Complete foot exam   05/11/2020  . Hemoglobin A1C  05/19/2020  . Urine Protein Check  11/16/2020  . Tetanus Vaccine  06/19/2021  . Colon Cancer Screening  02/09/2024  .  Hepatitis C: One time screening is recommended by Center for Disease Control  (CDC) for  adults born from [birth year] through 1963.   Completed  . Flu Shot  Discontinued  . COVID-19 Vaccine  Discontinued

## 2020-02-23 NOTE — Progress Notes (Addendum)
Subjective:   Donald [name] is a 74 y.o. male who presents for Medicare Annual/Subsequent preventive examination.  Review of Systems:  No ROS.  Medicare Wellness Virtual Visit.   Cardiac Risk Factors include: advanced age (>76men, >93 women);male gender;diabetes mellitus     Objective:    Vitals: Ht 5' 9.75" (1.772 m)   Wt 185 lb (83.9 kg)   BMI 26.74 kg/m   Body mass index is 26.74 kg/m.  Advanced Directives 02/23/2020 02/13/2019 12/13/2017 12/12/2016 12/13/2015  Does Patient Have a Medical Advance Directive? No No No No No  Does patient want to make changes to medical advance directive? - - - Yes (MAU/Ambulatory/Procedural Areas - Information given) -  Would patient like information on creating a medical advance directive? No - Patient declined Yes (MAU/Ambulatory/Procedural Areas - Information given) No - Patient declined - No - patient declined information    Tobacco Social History   Tobacco Use  Smoking Status Former Smoker  Smokeless Tobacco Never Used     Counseling given: Not Answered   Clinical Intake:  Pre-visit preparation completed: Yes        Diabetes: Yes (Followed by pcp)  How often do you need to have someone help you when you read instructions, pamphlets, or other written materials from your doctor or pharmacy?: 1 - Never  Interpreter Needed?: No     Past Medical History:  Diagnosis Date  . Carpal tunnel syndrome   . Diabetes mellitus without complication ([place])   . Nephrolithiasis    H/O stones  . Rheumatoid arthritis [facility])    Past Surgical History:  Procedure Laterality Date  . APPENDECTOMY  1962  . CARPAL TUNNEL RELEASE Bilateral    hands   Family History  Problem Relation Age of Onset  . Heart disease Father   . Hypertension Father   . Diabetes Maternal Grandmother    Social History   Socioeconomic History  . Marital status: Married    Spouse name: Not on file  . Number of children: Not on file  . Years of education: Not on file   . Highest education level: Not on file  Occupational History  . Not on file  Tobacco Use  . Smoking status: Former Research scientist (life sciences)  . Smokeless tobacco: Never Used  Vaping Use  . Vaping Use: Never used  Substance and Sexual Activity  . Alcohol use: No    Alcohol/week: 0.0 standard drinks  . Drug use: No  . Sexual activity: Not Currently  Other Topics Concern  . Not on file  Social History Narrative  . Not on file   Social Determinants of Health   Financial Resource Strain:   . Difficulty of Paying Living Expenses:   Food Insecurity:   . Worried About Charity fundraiser in the Last Year:   . Arboriculturist in the Last Year:   Transportation Needs:   . Film/video editor (Medical):   Marland Kitchen Lack of Transportation (Non-Medical):   Physical Activity:   . Days of Exercise per Week:   . Minutes of Exercise per Session:   Stress:   . Feeling of Stress :   Social Connections:   . Frequency of Communication with Friends and Family:   . Frequency of Social Gatherings with Friends and Family:   . Attends Religious Services:   . Active Member of Clubs or Organizations:   . Attends Archivist Meetings:   Marland Kitchen Marital Status:     Outpatient Encounter Medications  as of 02/23/2020  Medication Sig  . ACCU-CHEK AVIVA PLUS test strip TEST TWO TIMES DAILY  . ACCU-CHEK SOFTCLIX LANCETS lancets TEST TWO TIMES DAILY  . cetirizine (ZYRTEC) 10 MG tablet Take 10 mg by mouth as needed for allergies.  Marland Kitchen gabapentin (NEURONTIN) 100 MG capsule TAKE 1 CAPSULE  AT BEDTIME.  Marland Kitchen leflunomide (ARAVA) 10 MG tablet Take 10 mg by mouth daily.   . rosuvastatin (CRESTOR) 5 MG tablet TAKE 1 TABLET EVERY DAY   No facility-administered encounter medications on file as of 02/23/2020.    Activities of Daily Living In your present state of health, do you have any difficulty performing the following activities: 02/23/2020  Hearing? N  Vision? N  Difficulty concentrating or making decisions? N  Walking or  climbing stairs? N  Dressing or bathing? N  Doing errands, shopping? N  Preparing Food and eating ? N  Using the Toilet? N  In the past six months, have you accidently leaked urine? N  Do you have problems with loss of bowel control? N  Managing your Medications? N  Managing your Finances? N  Housekeeping or managing your Housekeeping? N  Some recent data might be hidden    Patient Care Team: Einar Pheasant, MD as PCP - General (Internal Medicine)   Assessment:   This is a routine wellness examination for Donald [name].  I connected with Donald [name] today by telephone and verified that I am speaking with the correct person using two identifiers. Location patient: home Location provider: work Persons participating in the virtual visit: patient, Marine scientist.    I discussed the limitations, risks, security and privacy concerns of performing an evaluation and management service by telephone and the availability of in person appointments. The patient expressed understanding and verbally consented to this telephonic visit.    Interactive audio and video telecommunications were attempted between this provider and patient, however failed, due to patient having technical difficulties OR patient did not have access to video capability.  We continued and completed visit with audio only.  Some vital signs may be absent or patient reported.   Health Maintenance Due: -PNA vaccine- discussed; to be completed with doctor in visit or local pharmacy.  -Eye Exam- plans to schedule -Covid vaccine- discontinued per patient due to adverse reactions with flu vaccine. -Hgb A1c- 11/17/19 (6.4) See completed HM at the end of note.   Eye: Wears corrective lenses No retinopathy reported Visual acuity not assessed, virtual visit  They have seen their ophthalmologist   Dental: Visits every 6 months.    Hearing: Demonstrates normal hearing during visit.  Safety:  Patient feels safe at home- yes Patient does have  smoke detectors at home- yes Patient does wear sunscreen or protective clothing when in direct sunlight - yes Patient does wear seat belt when in a moving vehicle - yes Patient drives- yes Adequate lighting in walkways free from debris- yes Grab bars and handrails used as appropriate- yes Ambulates with an assistive device- no  Social: Alcohol intake - no       Smoking history- former  Smokers in home? none Illicit drug use? none  Medication: Taking as directed and without issues.  Self managed - yes   Covid-19: Precautions and sickness symptoms discussed. Wears mask, social distancing, hand hygiene as appropriate.   Activities of Daily Living Patient denies needing assistance with: household chores, feeding themselves, getting from bed to chair, getting to the toilet, bathing/showering, dressing, managing money, or preparing meals.   Discussed the importance  of a healthy diet, water intake and the benefits of aerobic exercise.   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 Encouraged low carb.  Water: good intake  Other Providers Patient Care Team: Einar Pheasant, MD as PCP - General (Internal Medicine)  Exercise Activities and Dietary recommendations Current Exercise Habits: The patient does not participate in regular exercise at present. Encouraged to stay active.   Goals    . DIET - REDUCE SUGAR INTAKE     Low carb diet       Fall Risk Fall Risk  02/23/2020 05/12/2019 02/13/2019 05/30/2018 12/13/2017  Falls in the past year? 0 0 0 No No  Comment - - - - -  Number falls in past yr: 0 - - - -  Injury with Fall? - - - - -  Risk for fall due to : - History of fall(s) - - -  Follow up Falls evaluation completed - - - -   Is the patient's home free of loose throw rugs in walkways, pet beds, electrical cords, etc?   Yes           Handrails on the stairs?   Yes      Adequate lighting?   Yes  Timed Get Up and Go Performed: No, virtual  visit  Depression Screen PHQ 2/9 Scores 02/23/2020 05/12/2019 02/13/2019 05/30/2018  PHQ - 2 Score 0 0 0 0  PHQ- 9 Score - 0 - -  Exception Documentation - - - -    Cognitive Function Patient is alert and oriented x3. Patient denies difficulty focusing or concentrating.  MMSE - Mini Mental State Exam 12/13/2017 12/12/2016 12/13/2015  Orientation to time 5 5 5   Orientation to Place 5 5 5   Registration 3 3 3   Attention/ Calculation 5 5 5   Recall 1 3 3   Language- name 2 objects 2 2 2   Language- repeat 1 1 1   Language- follow 3 step command 3 3 3   Language- read & follow direction 1 1 1   Write a sentence 1 1 1   Copy design 1 1 1   Total score 28 30 30      6CIT Screen 02/23/2020 02/13/2019  What Year? 0 points 0 points  What month? 0 points 0 points  What time? - 0 points  Count back from 20 - 0 points  Months in reverse 0 points 0 points  Repeat phrase - 0 points  Total Score - 0    Immunization History  Administered Date(s) Administered  . Pneumococcal Conjugate-13 06/04/2016  . Pneumococcal Polysaccharide-23 12/04/2012  . Td 05/14/2011  . Zoster 04/04/2012, 06/19/2013   Screening Tests Health Maintenance  Topic Date Due  . PNA vac Low Risk Adult (2 of 2 - PPSV23) 12/04/2017  . OPHTHALMOLOGY EXAM  08/27/2018  . FOOT EXAM  05/11/2020  . HEMOGLOBIN A1C  05/19/2020  . URINE MICROALBUMIN  11/16/2020  . TETANUS/TDAP  06/19/2021  . COLONOSCOPY  02/09/2024  . Hepatitis C Screening  Completed  . INFLUENZA VACCINE  Discontinued  . COVID-19 Vaccine  Discontinued       Plan:    Keep all routine maintenance appointments.   Next scheduled lab 05/26/20 @ 8:00  Cpe 05/30/20 @ 830  Medicare Attestation I have personally reviewed: The patient's medical and social history Their use of alcohol, tobacco or illicit drugs Their current medications and supplements The patient's functional ability including ADLs,fall risks, home safety risks, cognitive, and hearing and visual  impairment Diet and  physical activities Evidence for depression   I have reviewed and discussed with patient certain preventive protocols, quality metrics, and best practice recommendations.      Varney Biles, LPN  4/58/5929  Agree with plan and PCP out of office.  Mable Paris, NP

## 2020-04-07 DIAGNOSIS — L570 Actinic keratosis: Secondary | ICD-10-CM | POA: Diagnosis not present

## 2020-04-07 DIAGNOSIS — D225 Melanocytic nevi of trunk: Secondary | ICD-10-CM | POA: Diagnosis not present

## 2020-04-07 DIAGNOSIS — Z85828 Personal history of other malignant neoplasm of skin: Secondary | ICD-10-CM | POA: Diagnosis not present

## 2020-04-07 DIAGNOSIS — D485 Neoplasm of uncertain behavior of skin: Secondary | ICD-10-CM | POA: Diagnosis not present

## 2020-04-07 DIAGNOSIS — L738 Other specified follicular disorders: Secondary | ICD-10-CM | POA: Diagnosis not present

## 2020-04-07 DIAGNOSIS — D0462 Carcinoma in situ of skin of left upper limb, including shoulder: Secondary | ICD-10-CM | POA: Diagnosis not present

## 2020-04-07 DIAGNOSIS — D2261 Melanocytic nevi of right upper limb, including shoulder: Secondary | ICD-10-CM | POA: Diagnosis not present

## 2020-04-07 DIAGNOSIS — D2262 Melanocytic nevi of left upper limb, including shoulder: Secondary | ICD-10-CM | POA: Diagnosis not present

## 2020-04-07 DIAGNOSIS — D2271 Melanocytic nevi of right lower limb, including hip: Secondary | ICD-10-CM | POA: Diagnosis not present

## 2020-04-07 DIAGNOSIS — X32XXXA Exposure to sunlight, initial encounter: Secondary | ICD-10-CM | POA: Diagnosis not present

## 2020-04-26 ENCOUNTER — Other Ambulatory Visit: Payer: Self-pay | Admitting: Internal Medicine

## 2020-05-02 DIAGNOSIS — M0579 Rheumatoid arthritis with rheumatoid factor of multiple sites without organ or systems involvement: Secondary | ICD-10-CM | POA: Diagnosis not present

## 2020-05-02 DIAGNOSIS — Z79899 Other long term (current) drug therapy: Secondary | ICD-10-CM | POA: Diagnosis not present

## 2020-05-11 DIAGNOSIS — D0462 Carcinoma in situ of skin of left upper limb, including shoulder: Secondary | ICD-10-CM | POA: Diagnosis not present

## 2020-05-26 ENCOUNTER — Other Ambulatory Visit (INDEPENDENT_AMBULATORY_CARE_PROVIDER_SITE_OTHER): Payer: Medicare HMO

## 2020-05-26 ENCOUNTER — Other Ambulatory Visit: Payer: Self-pay

## 2020-05-26 DIAGNOSIS — E7800 Pure hypercholesterolemia, unspecified: Secondary | ICD-10-CM | POA: Diagnosis not present

## 2020-05-26 DIAGNOSIS — Z125 Encounter for screening for malignant neoplasm of prostate: Secondary | ICD-10-CM | POA: Diagnosis not present

## 2020-05-26 DIAGNOSIS — M069 Rheumatoid arthritis, unspecified: Secondary | ICD-10-CM

## 2020-05-26 DIAGNOSIS — E119 Type 2 diabetes mellitus without complications: Secondary | ICD-10-CM | POA: Diagnosis not present

## 2020-05-26 LAB — HEPATIC FUNCTION PANEL
ALT: 17 U/L (ref 0–53)
AST: 18 U/L (ref 0–37)
Albumin: 4.3 g/dL (ref 3.5–5.2)
Alkaline Phosphatase: 57 U/L (ref 39–117)
Bilirubin, Direct: 0.1 mg/dL (ref 0.0–0.3)
Total Bilirubin: 0.6 mg/dL (ref 0.2–1.2)
Total Protein: 6.5 g/dL (ref 6.0–8.3)

## 2020-05-26 LAB — BASIC METABOLIC PANEL
BUN: 17 mg/dL (ref 6–23)
CO2: 29 mEq/L (ref 19–32)
Calcium: 9.1 mg/dL (ref 8.4–10.5)
Chloride: 104 mEq/L (ref 96–112)
Creatinine, Ser: 1.14 mg/dL (ref 0.40–1.50)
GFR: 63.09 mL/min (ref >60.00)
Glucose, Bld: 111 mg/dL — ABNORMAL HIGH (ref 70–99)
Potassium: 4.4 mEq/L (ref 3.5–5.1)
Sodium: 140 mEq/L (ref 135–145)

## 2020-05-26 LAB — LIPID PANEL
Cholesterol: 107 mg/dL (ref 0–200)
HDL: 41.3 mg/dL (ref >39.00)
LDL Cholesterol: 41 mg/dL (ref 0–99)
NonHDL: 65.25
Total CHOL/HDL Ratio: 3
Triglycerides: 121 mg/dL (ref 0.0–149.0)
VLDL: 24.2 mg/dL (ref 0.0–40.0)

## 2020-05-26 LAB — CBC WITH DIFFERENTIAL/PLATELET
Basophils Absolute: 0.1 10*3/uL (ref 0.0–0.1)
Basophils Relative: 1.5 % (ref 0.0–3.0)
Eosinophils Absolute: 0.4 10*3/uL (ref 0.0–0.7)
Eosinophils Relative: 8.1 % — ABNORMAL HIGH (ref 0.0–5.0)
HCT: 37.8 % — ABNORMAL LOW (ref 39.0–52.0)
Hemoglobin: 13.2 g/dL (ref 13.0–17.0)
Lymphocytes Relative: 18.7 % (ref 12.0–46.0)
Lymphs Abs: 1 10*3/uL (ref 0.7–4.0)
MCHC: 34.9 g/dL (ref 30.0–36.0)
MCV: 99.4 fl (ref 78.0–100.0)
Monocytes Absolute: 0.7 10*3/uL (ref 0.1–1.0)
Monocytes Relative: 14.2 % — ABNORMAL HIGH (ref 3.0–12.0)
Neutro Abs: 3 10*3/uL (ref 1.4–7.7)
Neutrophils Relative %: 57.5 % (ref 43.0–77.0)
Platelets: 190 10*3/uL (ref 150.0–400.0)
RBC: 3.8 Mil/uL — ABNORMAL LOW (ref 4.22–5.81)
RDW: 12.5 % (ref 11.5–15.5)
WBC: 5.2 10*3/uL (ref 4.0–10.5)

## 2020-05-26 LAB — TSH: TSH: 1.1 u[IU]/mL (ref 0.35–4.50)

## 2020-05-26 LAB — PSA, MEDICARE: PSA: 0.69 ng/ml (ref 0.10–4.00)

## 2020-05-26 LAB — HEMOGLOBIN A1C: Hgb A1c MFr Bld: 6.4 % (ref 4.6–6.5)

## 2020-05-30 ENCOUNTER — Encounter: Payer: Self-pay | Admitting: Internal Medicine

## 2020-05-30 ENCOUNTER — Ambulatory Visit (INDEPENDENT_AMBULATORY_CARE_PROVIDER_SITE_OTHER): Payer: Medicare HMO | Admitting: Internal Medicine

## 2020-05-30 ENCOUNTER — Other Ambulatory Visit: Payer: Self-pay

## 2020-05-30 VITALS — BP 130/70 | HR 70 | Temp 97.1°F | Resp 16 | Ht 70.0 in | Wt 175.0 lb

## 2020-05-30 DIAGNOSIS — Z1211 Encounter for screening for malignant neoplasm of colon: Secondary | ICD-10-CM

## 2020-05-30 DIAGNOSIS — I350 Nonrheumatic aortic (valve) stenosis: Secondary | ICD-10-CM | POA: Diagnosis not present

## 2020-05-30 DIAGNOSIS — Z0000 Encounter for general adult medical examination without abnormal findings: Secondary | ICD-10-CM

## 2020-05-30 DIAGNOSIS — E7800 Pure hypercholesterolemia, unspecified: Secondary | ICD-10-CM | POA: Diagnosis not present

## 2020-05-30 DIAGNOSIS — E119 Type 2 diabetes mellitus without complications: Secondary | ICD-10-CM

## 2020-05-30 DIAGNOSIS — M069 Rheumatoid arthritis, unspecified: Secondary | ICD-10-CM | POA: Diagnosis not present

## 2020-05-30 NOTE — Progress Notes (Signed)
Patient ID: ATIF CHAPPLE, male   DOB: 05/26/1946, 74 y.o.   MRN: [id number]   Subjective:    Patient ID: DOMENIQUE SOUTHERS, male    DOB: [birth year], 74 y.o.   MRN: [id number]  HPI This visit occurred during the SARS-CoV-2 public health emergency.  Safety protocols were in place, including screening questions prior to the visit, additional usage of staff PPE, and extensive cleaning of exam room while observing appropriate contact time as indicated for disinfecting solutions.  Patient here for his physical exam.  He reports he is doing well.  Feels good.  Stays active.  No chest pain or sob with increased activity or exertion.  No acid reflux or abdominal pain.  Bowels moving.  Handling stress.  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  Followed by dermatology.  Healing well.    Past Medical History:  Diagnosis Date   Carpal tunnel syndrome    Diabetes mellitus without complication ([facility])    Nephrolithiasis    H/O stones   Rheumatoid arthritis ([place])    Past Surgical History:  Procedure Laterality Date   APPENDECTOMY  1962   CARPAL TUNNEL RELEASE Bilateral    hands   Family History  Problem Relation Age of Onset   Heart disease Father    Hypertension Father    Diabetes Maternal Grandmother    Social History   Socioeconomic History   Marital status: Married    Spouse name: Not on file   Number of children: Not on file   Years of education: Not on file   Highest education level: Not on file  Occupational History   Not on file  Tobacco Use   Smoking status: Former Smoker   Smokeless tobacco: Never Used  Scientific laboratory technician Use: Never used  Substance and Sexual Activity   Alcohol use: No    Alcohol/week: 0.0 standard drinks   Drug use: No   Sexual activity: Not Currently  Other Topics Concern   Not on file  Social History Narrative   Not on file   Social Determinants of Health   Financial Resource Strain:    Difficulty of Paying Living  Expenses: Not on file  Food Insecurity:    Worried About Charity fundraiser in the Last Year: Not on file   [employer] of Food in the Last Year: Not on file  Transportation Needs:    Lack of Transportation (Medical): Not on file   Lack of Transportation (Non-Medical): Not on file  Physical Activity:    Days of Exercise per Week: Not on file   Minutes of Exercise per Session: Not on file  Stress:    Feeling of Stress : Not on file  Social Connections:    Frequency of Communication with Friends and Family: Not on file   Frequency of Social Gatherings with Friends and Family: Not on file   Attends Religious Services: Not on file   Active Member of Clubs or Organizations: Not on file   Attends Archivist Meetings: Not on file   Marital Status: Not on file    Outpatient Encounter Medications as of 05/30/2020  Medication Sig   ACCU-CHEK AVIVA PLUS test strip TEST TWO TIMES DAILY   ACCU-CHEK SOFTCLIX LANCETS lancets TEST TWO TIMES DAILY   cetirizine (ZYRTEC) 10 MG tablet Take 10 mg by mouth as needed for allergies.   gabapentin (NEURONTIN) 100 MG capsule TAKE 1 CAPSULE AT BEDTIME   leflunomide (ARAVA) 10 MG  tablet Take 10 mg by mouth daily.    rosuvastatin (CRESTOR) 5 MG tablet TAKE 1 TABLET EVERY DAY   No facility-administered encounter medications on file as of 05/30/2020.    Review of Systems  Constitutional: Negative for appetite change and unexpected weight change.  HENT: Negative for congestion and sinus pressure.   Eyes: Negative for pain and visual disturbance.  Respiratory: Negative for cough, chest tightness and shortness of breath.   Cardiovascular: Negative for chest pain, palpitations and leg swelling.  Gastrointestinal: Negative for abdominal pain, diarrhea, nausea and vomiting.  Genitourinary: Negative for difficulty urinating and dysuria.  Musculoskeletal: Negative for joint swelling and myalgias.  Skin: Negative for color change and rash.   Neurological: Negative for dizziness, light-headedness and headaches.  Hematological: Negative for adenopathy. Does not bruise/bleed easily.  Psychiatric/Behavioral: Negative for agitation and dysphoric mood.       Objective:    Physical Exam Vitals reviewed.  Constitutional:      General: He is not in acute distress.    Appearance: Normal appearance. He is well-developed.  HENT:     Head: Normocephalic and atraumatic.     Right Ear: External ear normal.     Left Ear: External ear normal.     Mouth/Throat:     Pharynx: No oropharyngeal exudate.  Eyes:     General: No scleral icterus.       Right eye: No discharge.        Left eye: No discharge.     Conjunctiva/sclera: Conjunctivae normal.  Neck:     Thyroid: No thyromegaly.  Cardiovascular:     Rate and Rhythm: Normal rate and regular rhythm.  Pulmonary:     Effort: No respiratory distress.     Breath sounds: Normal breath sounds. No wheezing.  Abdominal:     General: Bowel sounds are normal.     Palpations: Abdomen is soft.     Tenderness: There is no abdominal tenderness.  Genitourinary:    Comments: Not performed.  Musculoskeletal:        General: No swelling or tenderness.     Cervical back: Neck supple. No tenderness.  Lymphadenopathy:     Cervical: No cervical adenopathy.  Skin:    Findings: No erythema or rash.     Comments: Healing incision - left forearm.   Neurological:     Mental Status: He is alert and oriented to person, place, and time.  Psychiatric:        Mood and Affect: Mood normal.        Behavior: Behavior normal.     BP 130/70    Pulse 70    Temp (!) 97.1 F (36.2 C) (Oral)    Resp 16    Ht 5' 10"  (1.778 m)    Wt 175 lb (79.4 kg)    SpO2 98%    BMI 25.11 kg/m  Wt Readings from Last 3 Encounters:  05/30/20 175 lb (79.4 kg)  02/23/20 185 lb (83.9 kg)  11/19/19 185 lb 3.2 oz (84 kg)     Lab Results  Component Value Date   WBC 5.2 05/26/2020   HGB 13.2 05/26/2020   HCT 37.8 (L)  05/26/2020   PLT 190.0 05/26/2020   GLUCOSE 111 (H) 05/26/2020   CHOL 107 05/26/2020   TRIG 121.0 05/26/2020   HDL 41.30 05/26/2020   LDLCALC 41 05/26/2020   ALT 17 05/26/2020   AST 18 05/26/2020   NA 140 05/26/2020   K 4.4 05/26/2020  CL 104 05/26/2020   CREATININE 1.14 05/26/2020   BUN 17 05/26/2020   CO2 29 05/26/2020   TSH 1.10 05/26/2020   PSA 0.69 05/26/2020   HGBA1C 6.4 05/26/2020   MICROALBUR 1.3 11/17/2019        Assessment & Plan:   Problem List Items Addressed This Visit    Rheumatoid arthritis ([place])    On leflunomide.  Followed by Dr [name].  Stable.       Hypercholesterolemia    On crestor.  Low cholesterol diet and exercise.  Follow lipid panel and liver function tests.        Health care maintenance    Physical today 05/30/20.  Colonoscopy  02/2014 - internal hemorrhoids.  PSA 05/26/20 - .69.  Hemoccult cards given.        Diabetes ([place])    Low carb diet and exercise.  On no medication.  Follow met b and a1c.        Aortic stenosis    Mild AS on previous ECHO.  Asymptomatic.  Discussed f/u echo. He wants to hold.  Follow        Other Visit Diagnoses    Colon cancer screening    -  Primary   Relevant Orders   Fecal occult blood, imunochemical       Einar Pheasant, MD

## 2020-05-30 NOTE — Assessment & Plan Note (Signed)
Physical today 05/30/20.  Colonoscopy  02/2014 - internal hemorrhoids.  PSA 05/26/20 - .69.  Hemoccult cards given.

## 2020-06-05 ENCOUNTER — Encounter: Payer: Self-pay | Admitting: Internal Medicine

## 2020-06-05 NOTE — Assessment & Plan Note (Signed)
Mild AS on previous ECHO.  Asymptomatic.  Discussed f/u echo. He wants to hold.  Follow

## 2020-06-05 NOTE — Assessment & Plan Note (Signed)
On leflunomide.  Followed by Dr [name].  Stable.

## 2020-06-05 NOTE — Assessment & Plan Note (Signed)
Low carb diet and exercise.  On no medication.  Follow met b and a1c.   

## 2020-06-05 NOTE — Assessment & Plan Note (Signed)
On crestor.  Low cholesterol diet and exercise.  Follow lipid panel and liver function tests.   

## 2020-07-22 ENCOUNTER — Telehealth: Payer: Self-pay | Admitting: Internal Medicine

## 2020-07-22 NOTE — Telephone Encounter (Signed)
Patient called about a bill for physical he was billed twice once on 11/19/2019 and billed again on 05/30/2020 [facility] gave him this information can contact him on his home [phone]

## 2020-07-27 NOTE — Telephone Encounter (Signed)
Hi Vanessa,  The patient was not billed for a physical on 11/19/19.  They were billed for an office visit which was paid in full by insurance.  [employer] MCR is denying physical from 05/30/20.  The denial in Epic states benefit max reached.  I can email the manager of [employer] to see if someone on her staff can research this.  I will cc you on the email.  Thanks, Sharyn Lull

## 2020-07-27 NOTE — Telephone Encounter (Signed)
Hello Donald [name] ,  Could you look at the CPT and DX codes per the patient he was billed twice for a physical .

## 2020-08-01 DIAGNOSIS — Z79899 Other long term (current) drug therapy: Secondary | ICD-10-CM | POA: Diagnosis not present

## 2020-08-01 DIAGNOSIS — M0579 Rheumatoid arthritis with rheumatoid factor of multiple sites without organ or systems involvement: Secondary | ICD-10-CM | POA: Diagnosis not present

## 2020-08-08 DIAGNOSIS — M0579 Rheumatoid arthritis with rheumatoid factor of multiple sites without organ or systems involvement: Secondary | ICD-10-CM | POA: Diagnosis not present

## 2020-08-08 DIAGNOSIS — M65331 Trigger finger, right middle finger: Secondary | ICD-10-CM | POA: Diagnosis not present

## 2020-08-08 DIAGNOSIS — E119 Type 2 diabetes mellitus without complications: Secondary | ICD-10-CM | POA: Diagnosis not present

## 2020-08-08 DIAGNOSIS — Z79899 Other long term (current) drug therapy: Secondary | ICD-10-CM | POA: Diagnosis not present

## 2020-09-12 DIAGNOSIS — L570 Actinic keratosis: Secondary | ICD-10-CM | POA: Diagnosis not present

## 2020-09-12 DIAGNOSIS — D485 Neoplasm of uncertain behavior of skin: Secondary | ICD-10-CM | POA: Diagnosis not present

## 2020-09-12 DIAGNOSIS — Z85828 Personal history of other malignant neoplasm of skin: Secondary | ICD-10-CM | POA: Diagnosis not present

## 2020-09-12 DIAGNOSIS — X32XXXA Exposure to sunlight, initial encounter: Secondary | ICD-10-CM | POA: Diagnosis not present

## 2020-09-12 DIAGNOSIS — D225 Melanocytic nevi of trunk: Secondary | ICD-10-CM | POA: Diagnosis not present

## 2020-09-12 DIAGNOSIS — D2271 Melanocytic nevi of right lower limb, including hip: Secondary | ICD-10-CM | POA: Diagnosis not present

## 2020-09-12 DIAGNOSIS — C44519 Basal cell carcinoma of skin of other part of trunk: Secondary | ICD-10-CM | POA: Diagnosis not present

## 2020-09-12 DIAGNOSIS — D2262 Melanocytic nevi of left upper limb, including shoulder: Secondary | ICD-10-CM | POA: Diagnosis not present

## 2020-09-12 DIAGNOSIS — D2261 Melanocytic nevi of right upper limb, including shoulder: Secondary | ICD-10-CM | POA: Diagnosis not present

## 2020-10-12 DIAGNOSIS — C44519 Basal cell carcinoma of skin of other part of trunk: Secondary | ICD-10-CM | POA: Diagnosis not present

## 2020-10-17 ENCOUNTER — Other Ambulatory Visit: Payer: Self-pay | Admitting: Internal Medicine

## 2020-11-07 DIAGNOSIS — M0579 Rheumatoid arthritis with rheumatoid factor of multiple sites without organ or systems involvement: Secondary | ICD-10-CM | POA: Diagnosis not present

## 2020-11-07 DIAGNOSIS — Z79899 Other long term (current) drug therapy: Secondary | ICD-10-CM | POA: Diagnosis not present

## 2020-11-18 ENCOUNTER — Encounter: Payer: Self-pay | Admitting: Internal Medicine

## 2020-11-24 ENCOUNTER — Other Ambulatory Visit: Payer: Medicare HMO

## 2020-11-28 ENCOUNTER — Ambulatory Visit: Payer: Medicare HMO | Admitting: Internal Medicine

## 2021-01-05 ENCOUNTER — Telehealth: Payer: Self-pay | Admitting: *Deleted

## 2021-01-05 DIAGNOSIS — E119 Type 2 diabetes mellitus without complications: Secondary | ICD-10-CM

## 2021-01-05 DIAGNOSIS — D649 Anemia, unspecified: Secondary | ICD-10-CM

## 2021-01-05 DIAGNOSIS — M069 Rheumatoid arthritis, unspecified: Secondary | ICD-10-CM

## 2021-01-05 DIAGNOSIS — E7800 Pure hypercholesterolemia, unspecified: Secondary | ICD-10-CM

## 2021-01-05 NOTE — Telephone Encounter (Signed)
Please place future orders for lab appt.  

## 2021-01-06 ENCOUNTER — Other Ambulatory Visit (INDEPENDENT_AMBULATORY_CARE_PROVIDER_SITE_OTHER): Payer: Medicare HMO

## 2021-01-06 ENCOUNTER — Other Ambulatory Visit: Payer: Self-pay

## 2021-01-06 DIAGNOSIS — E119 Type 2 diabetes mellitus without complications: Secondary | ICD-10-CM | POA: Diagnosis not present

## 2021-01-06 DIAGNOSIS — M069 Rheumatoid arthritis, unspecified: Secondary | ICD-10-CM | POA: Diagnosis not present

## 2021-01-06 DIAGNOSIS — D649 Anemia, unspecified: Secondary | ICD-10-CM | POA: Diagnosis not present

## 2021-01-06 DIAGNOSIS — E7800 Pure hypercholesterolemia, unspecified: Secondary | ICD-10-CM

## 2021-01-06 LAB — BASIC METABOLIC PANEL
BUN: 19 mg/dL (ref 6–23)
CO2: 28 mEq/L (ref 19–32)
Calcium: 9.3 mg/dL (ref 8.4–10.5)
Chloride: 104 mEq/L (ref 96–112)
Creatinine, Ser: 1.12 mg/dL (ref 0.40–1.50)
GFR: 65.43 mL/min (ref >60.00)
Glucose, Bld: 113 mg/dL — ABNORMAL HIGH (ref 70–99)
Potassium: 4.1 mEq/L (ref 3.5–5.1)
Sodium: 141 mEq/L (ref 135–145)

## 2021-01-06 LAB — IBC + FERRITIN
Ferritin: 343.3 ng/mL — ABNORMAL HIGH (ref 22.0–322.0)
Iron: 97 ug/dL (ref 42–165)
Saturation Ratios: 37.5 % (ref 20.0–50.0)
Transferrin: 185 mg/dL — ABNORMAL LOW (ref 212.0–360.0)

## 2021-01-06 LAB — CBC WITH DIFFERENTIAL/PLATELET
Basophils Absolute: 0.1 10*3/uL (ref 0.0–0.1)
Basophils Relative: 2.2 % (ref 0.0–3.0)
Eosinophils Absolute: 0.3 10*3/uL (ref 0.0–0.7)
Eosinophils Relative: 7.3 % — ABNORMAL HIGH (ref 0.0–5.0)
HCT: 37.6 % — ABNORMAL LOW (ref 39.0–52.0)
Hemoglobin: 13 g/dL (ref 13.0–17.0)
Lymphocytes Relative: 23.7 % (ref 12.0–46.0)
Lymphs Abs: 0.9 10*3/uL (ref 0.7–4.0)
MCHC: 34.6 g/dL (ref 30.0–36.0)
MCV: 99.8 fl (ref 78.0–100.0)
Monocytes Absolute: 0.5 10*3/uL (ref 0.1–1.0)
Monocytes Relative: 13.7 % — ABNORMAL HIGH (ref 3.0–12.0)
Neutro Abs: 2 10*3/uL (ref 1.4–7.7)
Neutrophils Relative %: 53.1 % (ref 43.0–77.0)
Platelets: 206 10*3/uL (ref 150.0–400.0)
RBC: 3.76 Mil/uL — ABNORMAL LOW (ref 4.22–5.81)
RDW: 12.4 % (ref 11.5–15.5)
WBC: 3.8 10*3/uL — ABNORMAL LOW (ref 4.0–10.5)

## 2021-01-06 LAB — LIPID PANEL
Cholesterol: 122 mg/dL (ref 0–200)
HDL: 41.9 mg/dL (ref >39.00)
LDL Cholesterol: 53 mg/dL (ref 0–99)
NonHDL: 80.02
Total CHOL/HDL Ratio: 3
Triglycerides: 134 mg/dL (ref 0.0–149.0)
VLDL: 26.8 mg/dL (ref 0.0–40.0)

## 2021-01-06 LAB — HEPATIC FUNCTION PANEL
ALT: 16 U/L (ref 0–53)
AST: 20 U/L (ref 0–37)
Albumin: 4.4 g/dL (ref 3.5–5.2)
Alkaline Phosphatase: 59 U/L (ref 39–117)
Bilirubin, Direct: 0.2 mg/dL (ref 0.0–0.3)
Total Bilirubin: 0.7 mg/dL (ref 0.2–1.2)
Total Protein: 6.7 g/dL (ref 6.0–8.3)

## 2021-01-06 LAB — MICROALBUMIN / CREATININE URINE RATIO
Creatinine,U: 137.7 mg/dL
Microalb Creat Ratio: 0.5 mg/g (ref 0.0–30.0)
Microalb, Ur: <0.7 mg/dL (ref 0.0–1.9)

## 2021-01-06 LAB — VITAMIN B12: Vitamin B-12: 653 pg/mL (ref 211–911)

## 2021-01-06 LAB — HEMOGLOBIN A1C: Hgb A1c MFr Bld: 6.1 % (ref 4.6–6.5)

## 2021-01-06 NOTE — Telephone Encounter (Signed)
Orders placed for f/u labs.  

## 2021-01-10 ENCOUNTER — Other Ambulatory Visit: Payer: Self-pay

## 2021-01-10 ENCOUNTER — Ambulatory Visit (INDEPENDENT_AMBULATORY_CARE_PROVIDER_SITE_OTHER): Payer: Medicare HMO | Admitting: Internal Medicine

## 2021-01-10 VITALS — BP 118/62 | HR 84 | Temp 98.0°F | Resp 16 | Ht 70.0 in | Wt 172.6 lb

## 2021-01-10 DIAGNOSIS — Z1211 Encounter for screening for malignant neoplasm of colon: Secondary | ICD-10-CM | POA: Diagnosis not present

## 2021-01-10 DIAGNOSIS — E7800 Pure hypercholesterolemia, unspecified: Secondary | ICD-10-CM | POA: Diagnosis not present

## 2021-01-10 DIAGNOSIS — M069 Rheumatoid arthritis, unspecified: Secondary | ICD-10-CM

## 2021-01-10 DIAGNOSIS — I350 Nonrheumatic aortic (valve) stenosis: Secondary | ICD-10-CM | POA: Diagnosis not present

## 2021-01-10 DIAGNOSIS — E1165 Type 2 diabetes mellitus with hyperglycemia: Secondary | ICD-10-CM

## 2021-01-10 LAB — HM DIABETES FOOT EXAM

## 2021-01-10 NOTE — Progress Notes (Signed)
Patient ID: Donald [name], male   DOB: 02/12/1946, 75 y.o.   MRN: [id number]   Subjective:    Patient ID: Donald [name], male    DOB: Jan 30, 1946, 75 y.o.   MRN: [id number]  HPI This visit occurred during the SARS-CoV-2 public health emergency.  Safety protocols were in place, including screening questions prior to the visit, additional usage of staff PPE, and extensive cleaning of exam room while observing appropriate contact time as indicated for disinfecting solutions.  Patient here for a scheduled follow up.  Here to follow up regarding his blood sugar and cholesterol.  He is doing well.  Stays active.  No chest pain or sob reported.  On abdominal pain.  Bowels moving.  No blood in stool.  Joints doing well.  AM sugars averaging 103-120.  PM sugars 110.  Watching diet.  Has been trying to lose weight.  Up to date with eye exam.   Past Medical History:  Diagnosis Date  . Carpal tunnel syndrome   . Diabetes mellitus without complication ([place])   . Nephrolithiasis    H/O stones  . Rheumatoid arthritis [facility])    Past Surgical History:  Procedure Laterality Date  . APPENDECTOMY  1962  . CARPAL TUNNEL RELEASE Bilateral    hands   Family History  Problem Relation Age of Onset  . Heart disease Father   . Hypertension Father   . Diabetes Maternal Grandmother    Social History   Socioeconomic History  . Marital status: Married    Spouse name: Not on file  . Number of children: Not on file  . Years of education: Not on file  . Highest education level: Not on file  Occupational History  . Not on file  Tobacco Use  . Smoking status: Former Research scientist (life sciences)  . Smokeless tobacco: Never Used  Vaping Use  . Vaping Use: Never used  Substance and Sexual Activity  . Alcohol use: No    Alcohol/week: 0.0 standard drinks  . Drug use: No  . Sexual activity: Not Currently  Other Topics Concern  . Not on file  Social History Narrative  . Not on file   Social Determinants of Health   Financial  Resource Strain: Not on file  Food Insecurity: Not on file  Transportation Needs: Not on file  Physical Activity: Not on file  Stress: Not on file  Social Connections: Not on file    Outpatient Encounter Medications as of 01/10/2021  Medication Sig  . ACCU-CHEK AVIVA PLUS test strip TEST TWO TIMES DAILY  . ACCU-CHEK SOFTCLIX LANCETS lancets TEST TWO TIMES DAILY  . cetirizine (ZYRTEC) 10 MG tablet Take 10 mg by mouth as needed for allergies.  Marland Kitchen gabapentin (NEURONTIN) 100 MG capsule TAKE 1 CAPSULE AT BEDTIME  . leflunomide (ARAVA) 10 MG tablet Take 10 mg by mouth daily.   . rosuvastatin (CRESTOR) 5 MG tablet TAKE 1 TABLET EVERY DAY   No facility-administered encounter medications on file as of 01/10/2021.    Review of Systems  Constitutional: Negative for appetite change and unexpected weight change.  HENT: Negative for congestion and sinus pressure.   Respiratory: Negative for cough, chest tightness and shortness of breath.   Cardiovascular: Negative for chest pain, palpitations and leg swelling.  Gastrointestinal: Negative for abdominal pain, diarrhea, nausea and vomiting.  Genitourinary: Negative for difficulty urinating and dysuria.  Musculoskeletal: Negative for joint swelling and myalgias.  Skin: Negative for color change and rash.  Neurological: Negative for dizziness, light-headedness and  headaches.  Psychiatric/Behavioral: Negative for agitation and dysphoric mood.       Objective:    Physical Exam Vitals reviewed.  Constitutional:      General: He is not in acute distress.    Appearance: Normal appearance. He is well-developed.  HENT:     Head: Normocephalic and atraumatic.     Right Ear: External ear normal.     Left Ear: External ear normal.  Eyes:     General: No scleral icterus.       Right eye: No discharge.        Left eye: No discharge.     Conjunctiva/sclera: Conjunctivae normal.  Cardiovascular:     Rate and Rhythm: Normal rate and regular rhythm.   Pulmonary:     Effort: Pulmonary effort is normal. No respiratory distress.     Breath sounds: Normal breath sounds.  Abdominal:     General: Bowel sounds are normal.     Palpations: Abdomen is soft.     Tenderness: There is no abdominal tenderness.  Musculoskeletal:        General: No swelling or tenderness.     Cervical back: Neck supple.  Lymphadenopathy:     Cervical: No cervical adenopathy.  Skin:    Findings: No erythema or rash.  Neurological:     Mental Status: He is alert.  Psychiatric:        Mood and Affect: Mood normal.        Behavior: Behavior normal.     BP 118/62   Pulse 84   Temp 98 F (36.7 C) (Oral)   Resp 16   Ht 5' 10"  (1.778 m)   Wt 172 lb 9.6 oz (78.3 kg)   SpO2 98%   BMI 24.77 kg/m  Wt Readings from Last 3 Encounters:  01/10/21 172 lb 9.6 oz (78.3 kg)  05/30/20 175 lb (79.4 kg)  02/23/20 185 lb (83.9 kg)     Lab Results  Component Value Date   WBC 3.8 (L) 01/06/2021   HGB 13.0 01/06/2021   HCT 37.6 (L) 01/06/2021   PLT 206.0 01/06/2021   GLUCOSE 113 (H) 01/06/2021   CHOL 122 01/06/2021   TRIG 134.0 01/06/2021   HDL 41.90 01/06/2021   LDLCALC 53 01/06/2021   ALT 16 01/06/2021   AST 20 01/06/2021   NA 141 01/06/2021   K 4.1 01/06/2021   CL 104 01/06/2021   CREATININE 1.12 01/06/2021   BUN 19 01/06/2021   CO2 28 01/06/2021   TSH 1.10 05/26/2020   PSA 0.69 05/26/2020   HGBA1C 6.1 01/06/2021   MICROALBUR <0.7 01/06/2021       Assessment & Plan:   Problem List Items Addressed This Visit    Aortic stenosis    Mild AS noted on ECHO.  Asymptomatic.  Have discussed f/u echo.  Wants to hold on f/u echo.        Diabetes ([place])    Low carb diet and exercise given elevated blood sugar.  On no medication.  Follow met b and a1c.       Hypercholesterolemia    Continue crestor.  Low cholesterol diet and exercise.  Follow lipid panel and liver function tests.       Rheumatoid arthritis ([facility])    On leflunomide.  Followed by Dr  [name].  Stable.        Other Visit Diagnoses    Colon cancer screening    -  Primary   Relevant Orders   Fecal  occult blood, imunochemical       Einar Pheasant, MD

## 2021-01-15 ENCOUNTER — Encounter: Payer: Self-pay | Admitting: Internal Medicine

## 2021-01-15 NOTE — Assessment & Plan Note (Signed)
Continue crestor.  Low cholesterol diet and exercise. Follow lipid panel and liver function tests.   

## 2021-01-15 NOTE — Assessment & Plan Note (Signed)
On leflunomide.  Followed by Dr [name].  Stable.

## 2021-01-15 NOTE — Assessment & Plan Note (Signed)
Mild AS noted on ECHO.  Asymptomatic.  Have discussed f/u echo.  Wants to hold on f/u echo.

## 2021-01-15 NOTE — Assessment & Plan Note (Addendum)
Low carb diet and exercise given elevated blood sugar.  On no medication.  Follow met b and a1c.

## 2021-02-07 DIAGNOSIS — M0579 Rheumatoid arthritis with rheumatoid factor of multiple sites without organ or systems involvement: Secondary | ICD-10-CM | POA: Diagnosis not present

## 2021-02-07 DIAGNOSIS — Z79899 Other long term (current) drug therapy: Secondary | ICD-10-CM | POA: Diagnosis not present

## 2021-02-23 ENCOUNTER — Ambulatory Visit (INDEPENDENT_AMBULATORY_CARE_PROVIDER_SITE_OTHER): Payer: Medicare HMO

## 2021-02-23 VITALS — Ht 70.0 in | Wt 172.0 lb

## 2021-02-23 DIAGNOSIS — Z0000 Encounter for general adult medical examination without abnormal findings: Secondary | ICD-10-CM

## 2021-02-23 NOTE — Patient Instructions (Signed)
Donald [name] , Thank you for taking time to come for your Medicare Wellness Visit. I appreciate your ongoing commitment to your health goals. Please review the following plan we discussed and let me know if I can assist you in the future.   These are the goals we discussed:  Goals      DIET - REDUCE SUGAR INTAKE     Low carb diet         This is a list of the screening recommended for you and due dates:  Health Maintenance  Topic Date Due   Zoster (Shingles) Vaccine (1 of 2) 05/26/2021*   Pneumonia vaccines (2 of 2 - PPSV23) 01/10/2022*   Flu Shot  04/10/2021   Tetanus Vaccine  06/19/2021   Hemoglobin A1C  07/08/2021   Eye exam for diabetics  08/27/2021   Urine Protein Check  01/06/2022   Complete foot exam   01/10/2022   Colon Cancer Screening  02/09/2024   Hepatitis C Screening: USPSTF Recommendation to screen - Ages 18-79 yo.  Completed   HPV Vaccine  Aged Out   COVID-19 Vaccine  Discontinued  *Topic was postponed. The date shown is not the original due date.  Advanced directives: not yet completed  Conditions/risks identified: none new  Follow up in one year for your annual wellness visit.   Preventive Care 22 Years and Older, Male Preventive care refers to lifestyle choices and visits with your health care provider that can promote health and wellness. What does preventive care include? A yearly physical exam. This is also called an annual well check. Dental exams once or twice a year. Routine eye exams. Ask your health care provider how often you should have your eyes checked. Personal lifestyle choices, including: Daily care of your teeth and gums. Regular physical activity. Eating a healthy diet. Avoiding tobacco and drug use. Limiting alcohol use. Practicing safe sex. Taking low doses of aspirin every day. Taking vitamin and mineral supplements as recommended by your health care provider. What happens during an annual well check? The services and screenings  done by your health care provider during your annual well check will depend on your age, overall health, lifestyle risk factors, and family history of disease. Counseling  Your health care provider may ask you questions about your: Alcohol use. Tobacco use. Drug use. Emotional well-being. Home and relationship well-being. Sexual activity. Eating habits. History of falls. Memory and ability to understand (cognition). Work and work Statistician. Screening  You may have the following tests or measurements: Height, weight, and BMI. Blood pressure. Lipid and cholesterol levels. These may be checked every 5 years, or more frequently if you are over 40 years old. Skin check. Lung cancer screening. You may have this screening every year starting at age 62 if you have a 30-pack-year history of smoking and currently smoke or have quit within the past 15 years. Fecal occult blood test (FOBT) of the stool. You may have this test every year starting at age 31. Flexible sigmoidoscopy or colonoscopy. You may have a sigmoidoscopy every 5 years or a colonoscopy every 10 years starting at age 58. Prostate cancer screening. Recommendations will vary depending on your family history and other risks. Hepatitis C blood test. Hepatitis B blood test. Sexually transmitted disease (STD) testing. Diabetes screening. This is done by checking your blood sugar (glucose) after you have not eaten for a while (fasting). You may have this done every 1-3 years. Abdominal aortic aneurysm (AAA) screening. You may need this  if you are a current or former smoker. Osteoporosis. You may be screened starting at age 52 if you are at high risk. Talk with your health care provider about your test results, treatment options, and if necessary, the need for more tests. Vaccines  Your health care provider may recommend certain vaccines, such as: Influenza vaccine. This is recommended every year. Tetanus, diphtheria, and acellular  pertussis (Tdap, Td) vaccine. You may need a Td booster every 10 years. Zoster vaccine. You may need this after age 58. Pneumococcal 13-valent conjugate (PCV13) vaccine. One dose is recommended after age 97. Pneumococcal polysaccharide (PPSV23) vaccine. One dose is recommended after age 76. Talk to your health care provider about which screenings and vaccines you need and how often you need them. This information is not intended to replace advice given to you by your health care provider. Make sure you discuss any questions you have with your health care provider. Document Released: 09/23/2015 Document Revised: 05/16/2016 Document Reviewed: 06/28/2015 Elsevier Interactive Patient Education  2017 [employer] Prevention in the Home Falls can cause injuries. They can happen to people of all ages. There are many things you can do to make your home safe and to help prevent falls. What can I do on the outside of my home? Regularly fix the edges of walkways and driveways and fix any cracks. Remove anything that might make you trip as you walk through a door, such as a raised step or threshold. Trim any bushes or trees on the path to your home. Use bright outdoor lighting. Clear any walking paths of anything that might make someone trip, such as rocks or tools. Regularly check to see if handrails are loose or broken. Make sure that both sides of any steps have handrails. Any raised decks and porches should have guardrails on the edges. Have any leaves, snow, or ice cleared regularly. Use sand or salt on walking paths during winter. Clean up any spills in your garage right away. This includes oil or grease spills. What can I do in the bathroom? Use night lights. Install grab bars by the toilet and in the tub and shower. Do not use towel bars as grab bars. Use non-skid mats or decals in the tub or shower. If you need to sit down in the shower, use a plastic, non-slip stool. Keep the floor  dry. Clean up any water that spills on the floor as soon as it happens. Remove soap buildup in the tub or shower regularly. Attach bath mats securely with double-sided non-slip rug tape. Do not have throw rugs and other things on the floor that can make you trip. What can I do in the bedroom? Use night lights. Make sure that you have a light by your bed that is easy to reach. Do not use any sheets or blankets that are too big for your bed. They should not hang down onto the floor. Have a firm chair that has side arms. You can use this for support while you get dressed. Do not have throw rugs and other things on the floor that can make you trip. What can I do in the kitchen? Clean up any spills right away. Avoid walking on wet floors. Keep items that you use a lot in easy-to-reach places. If you need to reach something above you, use a strong step stool that has a grab bar. Keep electrical cords out of the way. Do not use floor polish or wax that makes floors slippery.  If you must use wax, use non-skid floor wax. Do not have throw rugs and other things on the floor that can make you trip. What can I do with my stairs? Do not leave any items on the stairs. Make sure that there are handrails on both sides of the stairs and use them. Fix handrails that are broken or loose. Make sure that handrails are as long as the stairways. Check any carpeting to make sure that it is firmly attached to the stairs. Fix any carpet that is loose or worn. Avoid having throw rugs at the top or bottom of the stairs. If you do have throw rugs, attach them to the floor with carpet tape. Make sure that you have a light switch at the top of the stairs and the bottom of the stairs. If you do not have them, ask someone to add them for you. What else can I do to help prevent falls? Wear shoes that: Do not have high heels. Have rubber bottoms. Are comfortable and fit you well. Are closed at the toe. Do not wear  sandals. If you use a stepladder: Make sure that it is fully opened. Do not climb a closed stepladder. Make sure that both sides of the stepladder are locked into place. Ask someone to hold it for you, if possible. Clearly Shellie and make sure that you can see: Any grab bars or handrails. First and last steps. Where the edge of each step is. Use tools that help you move around (mobility aids) if they are needed. These include: Canes. Walkers. Scooters. Crutches. Turn on the lights when you go into a dark area. Replace any light bulbs as soon as they burn out. Set up your furniture so you have a clear path. Avoid moving your furniture around. If any of your floors are uneven, fix them. If there are any pets around you, be aware of where they are. Review your medicines with your doctor. Some medicines can make you feel dizzy. This can increase your chance of falling. Ask your doctor what other things that you can do to help prevent falls. This information is not intended to replace advice given to you by your health care provider. Make sure you discuss any questions you have with your health care provider. Document Released: 06/23/2009 Document Revised: 02/02/2016 Document Reviewed: 10/01/2014 Elsevier Interactive Patient Education  2017 [employer].

## 2021-02-23 NOTE — Progress Notes (Signed)
Subjective:   Donald [name] is a 75 y.o. male who presents for Medicare Annual/Subsequent preventive examination.  Review of Systems    No ROS.  Medicare Wellness Virtual Visit.  Visual/audio telehealth visit, UTA vital signs.   See social history for additional risk factors.   Cardiac Risk Factors include: diabetes mellitus;male gender;hypertension     Objective:    Today's Vitals   02/23/21 0828  Weight: 172 lb (78 kg)  Height: 5\' 10"  (1.778 m)   Body mass index is 24.68 kg/m.  Advanced Directives 02/23/2021 02/23/2020 02/13/2019 12/13/2017 12/12/2016 12/13/2015  Does Patient Have a Medical Advance Directive? No No No No No No  Does patient want to make changes to medical advance directive? - - - - Yes (MAU/Ambulatory/Procedural Areas - Information given) -  Would patient like information on creating a medical advance directive? No - Patient declined No - Patient declined Yes (MAU/Ambulatory/Procedural Areas - Information given) No - Patient declined - No - patient declined information    Current Medications (verified) Outpatient Encounter Medications as of 02/23/2021  Medication Sig   ACCU-CHEK AVIVA PLUS test strip TEST TWO TIMES DAILY   ACCU-CHEK SOFTCLIX LANCETS lancets TEST TWO TIMES DAILY   cetirizine (ZYRTEC) 10 MG tablet Take 10 mg by mouth as needed for allergies.   gabapentin (NEURONTIN) 100 MG capsule TAKE 1 CAPSULE AT BEDTIME   leflunomide (ARAVA) 10 MG tablet Take 10 mg by mouth daily.    rosuvastatin (CRESTOR) 5 MG tablet TAKE 1 TABLET EVERY DAY   No facility-administered encounter medications on file as of 02/23/2021.    Allergies (verified) Patient has no known allergies.   History: Past Medical History:  Diagnosis Date   Carpal tunnel syndrome    Diabetes mellitus without complication (HCC)    Nephrolithiasis    H/O stones   Rheumatoid arthritis ([place])    Past Surgical History:  Procedure Laterality Date   APPENDECTOMY  1962   CARPAL TUNNEL RELEASE  Bilateral    hands   Family History  Problem Relation Age of Onset   Heart disease Father    Hypertension Father    Diabetes Maternal Grandmother    Social History   Socioeconomic History   Marital status: Married    Spouse name: Not on file   Number of children: Not on file   Years of education: Not on file   Highest education level: Not on file  Occupational History   Not on file  Tobacco Use   Smoking status: Former    Pack years: 0.00   Smokeless tobacco: Never  Vaping Use   Vaping Use: Never used  Substance and Sexual Activity   Alcohol use: No    Alcohol/week: 0.0 standard drinks   Drug use: No   Sexual activity: Not Currently  Other Topics Concern   Not on file  Social History Narrative   Not on file   Social Determinants of Health   Financial Resource Strain: Not on file  Food Insecurity: No Food Insecurity   Worried About Running Out of Food in the Last Year: Never true   [place] in the Last Year: Never true  Transportation Needs: No Transportation Needs   Lack of Transportation (Medical): No   Lack of Transportation (Non-Medical): No  Physical Activity: Insufficiently Active   Days of Exercise per Week: 4 days   Minutes of Exercise per Session: 20 min  Stress: No Stress Concern Present   Feeling of Stress :  Not at all  Social Connections: Unknown   Frequency of Communication with Friends and Family: Not on file   Frequency of Social Gatherings with Friends and Family: Not on file   Attends Religious Services: Not on Electrical engineer or Organizations: Not on file   Attends Archivist Meetings: Not on file   Marital Status: Married    Tobacco Counseling Counseling given: Not Answered   Clinical Intake:  Pre-visit preparation completed: Yes        Diabetes: Yes (Followed by pcp)     Nutrition Risk Assessment: Has the patient had any N/V/D within the last 2 months?  No  Does the patient have any  non-healing wounds?  No  Has the patient had any unintentional weight loss or weight gain?  No   Financial Strains and Diabetes Management:  Are you having any financial strains with the device, your supplies or your medication? No .  Does the patient want to be seen by Chronic Care Management for management of their diabetes?  No  Would the patient like to be referred to a Nutritionist or for Diabetic Management?  No          Activities of Daily Living In your present state of health, do you have any difficulty performing the following activities: 02/23/2021  Hearing? N  Vision? N  Difficulty concentrating or making decisions? N  Walking or climbing stairs? N  Dressing or bathing? N  Doing errands, shopping? N  Preparing Food and eating ? N  Using the Toilet? N  In the past six months, have you accidently leaked urine? N  Do you have problems with loss of bowel control? N  Managing your Medications? N  Managing your Finances? N  Housekeeping or managing your Housekeeping? N  Some recent data might be hidden    Patient Care Team: Donald Pheasant, MD as PCP - General (Internal Medicine)  Indicate any recent Medical Services you may have received from other than [facility] providers in the past year (date may be approximate).     Assessment:   This is a routine wellness examination for Donald [name].  I connected with Donald [name] today by telephone and verified that I am speaking with the correct person using two identifiers. Location patient: home Location provider: work Persons participating in the virtual visit: patient, Donald [name].    I discussed the limitations, risks, security and privacy concerns of performing an evaluation and management service by telephone and the availability of in person appointments. The patient expressed understanding and verbally consented to this telephonic visit.    Interactive audio and video telecommunications were attempted between this provider and patient,  however failed, due to patient having technical difficulties OR patient did not have access to video capability.  We continued and completed visit with audio only.  Some vital signs may be absent or patient reported.   Hearing/Vision screen Hearing Screening - Comments:: Patient is able to hear conversational tones without difficulty.  No issues reported. Vision Screening - Comments:: Followed by Dr. Marvel Plan  Wears corrective lenses  Diabetic exam; no retinopathy  They have regular follow up with the ophthalmologist  Dietary issues and exercise activities discussed: Current Exercise Habits: Home exercise routine, Intensity: Mild He tries to have a healthy diet Good water intake   Goals Addressed             This Visit's Progress    DIET - REDUCE SUGAR INTAKE   On track  Low carb diet        Depression Screen PHQ 2/9 Scores 02/23/2021 02/23/2020 05/12/2019 02/13/2019 05/30/2018 12/13/2017 12/12/2016  PHQ - 2 Score 0 0 0 0 0 0 0  PHQ- 9 Score - - 0 - - - -  Exception Documentation - - - - - - -    Fall Risk Fall Risk  02/23/2021 02/23/2020 05/12/2019 02/13/2019 05/30/2018  Falls in the past year? 0 0 0 0 No  Comment - - - - -  Number falls in past yr: 0 0 - - -  Injury with Fall? 0 - - - -  Risk for fall due to : - - History of fall(s) - -  Follow up Falls evaluation completed Falls evaluation completed - - -    FALL RISK PREVENTION PERTAINING TO THE HOME: Handrails in use when climbing stairs? Yes Home free of loose throw rugs in walkways, pet beds, electrical cords, etc? Yes  Adequate lighting in your home to reduce risk of falls? Yes   ASSISTIVE DEVICES UTILIZED TO PREVENT FALLS: Life alert? No  Use of a cane, walker or w/c? No   TIMED UP AND GO: Was the test performed? No .   Cognitive Function: Patient is alert and oriented x3.  MMSE - Mini Mental State Exam 12/13/2017 12/12/2016 12/13/2015  Orientation to time 5 5 5   Orientation to Place 5 5 5   Registration 3 3 3    Attention/ Calculation 5 5 5   Recall 1 3 3   Language- name 2 objects 2 2 2   Language- repeat 1 1 1   Language- follow 3 step command 3 3 3   Language- read & follow direction 1 1 1   Write a sentence 1 1 1   Copy design 1 1 1   Total score 28 30 30      6CIT Screen 02/23/2021 02/23/2020 02/13/2019  What Year? 0 points 0 points 0 points  What month? 0 points 0 points 0 points  What time? 0 points - 0 points  Count back from 20 0 points - 0 points  Months in reverse 0 points 0 points 0 points  Repeat phrase 0 points - 0 points  Total Score 0 - 0    Immunizations Immunization History  Administered Date(s) Administered   Pneumococcal Conjugate-13 06/04/2016   Pneumococcal Polysaccharide-23 12/04/2012   Td 05/14/2011   Zoster, Live 04/04/2012, 06/19/2013    Health Maintenance  There are no preventive care reminders to display for this patient. Health Maintenance  Topic Date Due   Zoster Vaccines- Shingrix (1 of 2) 05/26/2021 (Originally 01/29/1965)   PNA vac Low Risk Adult (2 of 2 - PPSV23) 01/10/2022 (Originally 12/04/2017)   INFLUENZA VACCINE  04/10/2021   TETANUS/TDAP  06/19/2021   HEMOGLOBIN A1C  07/08/2021   OPHTHALMOLOGY EXAM  08/27/2021   URINE MICROALBUMIN  01/06/2022   FOOT EXAM  01/10/2022   COLONOSCOPY (Pts 45-3yrs Insurance coverage will need to be confirmed)  02/09/2024   Hepatitis C Screening  Completed   HPV VACCINES  Aged Out   COVID-19 Vaccine  Discontinued   Vision Screening: Recommended annual ophthalmology exams for early detection of glaucoma and other disorders of the eye. Is the patient up to date with their annual eye exam?  Yes   Dental Screening: Recommended annual dental exams for proper oral hygiene  Community Resource Referral / Chronic Care Management: CRR required this visit?  No   CCM required this visit?  No      Plan:  Keep all routine maintenance appointments.   I have personally reviewed and noted the following in the patient's  chart:   Medical and social history Use of alcohol, tobacco or illicit drugs  Current medications and supplements including opioid prescriptions. Patient is not currently taking opioid prescriptions. Functional ability and status Nutritional status Physical activity Advanced directives List of other physicians Hospitalizations, surgeries, and ER visits in previous 12 months Vitals Screenings to include cognitive, depression, and falls Referrals and appointments  In addition, I have reviewed and discussed with patient certain preventive protocols, quality metrics, and best practice recommendations. A written personalized care plan for preventive services as well as general preventive health recommendations were provided to patient via mail.     Varney Biles, LPN   6/68/1594

## 2021-03-20 DIAGNOSIS — D2262 Melanocytic nevi of left upper limb, including shoulder: Secondary | ICD-10-CM | POA: Diagnosis not present

## 2021-03-20 DIAGNOSIS — D2261 Melanocytic nevi of right upper limb, including shoulder: Secondary | ICD-10-CM | POA: Diagnosis not present

## 2021-03-20 DIAGNOSIS — X32XXXA Exposure to sunlight, initial encounter: Secondary | ICD-10-CM | POA: Diagnosis not present

## 2021-03-20 DIAGNOSIS — C4442 Squamous cell carcinoma of skin of scalp and neck: Secondary | ICD-10-CM | POA: Diagnosis not present

## 2021-03-20 DIAGNOSIS — D2271 Melanocytic nevi of right lower limb, including hip: Secondary | ICD-10-CM | POA: Diagnosis not present

## 2021-03-20 DIAGNOSIS — L570 Actinic keratosis: Secondary | ICD-10-CM | POA: Diagnosis not present

## 2021-03-20 DIAGNOSIS — C44722 Squamous cell carcinoma of skin of right lower limb, including hip: Secondary | ICD-10-CM | POA: Diagnosis not present

## 2021-03-20 DIAGNOSIS — Z85828 Personal history of other malignant neoplasm of skin: Secondary | ICD-10-CM | POA: Diagnosis not present

## 2021-03-20 DIAGNOSIS — D485 Neoplasm of uncertain behavior of skin: Secondary | ICD-10-CM | POA: Diagnosis not present

## 2021-03-29 DIAGNOSIS — C4442 Squamous cell carcinoma of skin of scalp and neck: Secondary | ICD-10-CM | POA: Diagnosis not present

## 2021-04-12 DIAGNOSIS — Z03818 Encounter for observation for suspected exposure to other biological agents ruled out: Secondary | ICD-10-CM | POA: Diagnosis not present

## 2021-04-12 DIAGNOSIS — R6889 Other general symptoms and signs: Secondary | ICD-10-CM | POA: Diagnosis not present

## 2021-04-12 DIAGNOSIS — Z20822 Contact with and (suspected) exposure to covid-19: Secondary | ICD-10-CM | POA: Diagnosis not present

## 2021-04-12 DIAGNOSIS — J209 Acute bronchitis, unspecified: Secondary | ICD-10-CM | POA: Diagnosis not present

## 2021-04-12 DIAGNOSIS — J019 Acute sinusitis, unspecified: Secondary | ICD-10-CM | POA: Diagnosis not present

## 2021-04-12 DIAGNOSIS — B9689 Other specified bacterial agents as the cause of diseases classified elsewhere: Secondary | ICD-10-CM | POA: Diagnosis not present

## 2021-04-17 ENCOUNTER — Other Ambulatory Visit: Payer: Self-pay | Admitting: Internal Medicine

## 2021-05-01 ENCOUNTER — Other Ambulatory Visit: Payer: Self-pay | Admitting: Internal Medicine

## 2021-05-08 DIAGNOSIS — M0579 Rheumatoid arthritis with rheumatoid factor of multiple sites without organ or systems involvement: Secondary | ICD-10-CM | POA: Diagnosis not present

## 2021-05-08 DIAGNOSIS — Z79899 Other long term (current) drug therapy: Secondary | ICD-10-CM | POA: Diagnosis not present

## 2021-05-08 DIAGNOSIS — M65331 Trigger finger, right middle finger: Secondary | ICD-10-CM | POA: Diagnosis not present

## 2021-05-08 DIAGNOSIS — E119 Type 2 diabetes mellitus without complications: Secondary | ICD-10-CM | POA: Diagnosis not present

## 2021-05-09 DIAGNOSIS — C44722 Squamous cell carcinoma of skin of right lower limb, including hip: Secondary | ICD-10-CM | POA: Diagnosis not present

## 2021-07-13 ENCOUNTER — Ambulatory Visit (INDEPENDENT_AMBULATORY_CARE_PROVIDER_SITE_OTHER): Payer: Medicare HMO | Admitting: Internal Medicine

## 2021-07-13 ENCOUNTER — Other Ambulatory Visit: Payer: Self-pay

## 2021-07-13 ENCOUNTER — Encounter: Payer: Self-pay | Admitting: Internal Medicine

## 2021-07-13 VITALS — BP 132/78 | HR 74 | Temp 97.7°F | Resp 14 | Ht 69.0 in | Wt 171.0 lb

## 2021-07-13 DIAGNOSIS — Z125 Encounter for screening for malignant neoplasm of prostate: Secondary | ICD-10-CM | POA: Diagnosis not present

## 2021-07-13 DIAGNOSIS — D649 Anemia, unspecified: Secondary | ICD-10-CM

## 2021-07-13 DIAGNOSIS — Z0000 Encounter for general adult medical examination without abnormal findings: Secondary | ICD-10-CM

## 2021-07-13 DIAGNOSIS — I350 Nonrheumatic aortic (valve) stenosis: Secondary | ICD-10-CM

## 2021-07-13 DIAGNOSIS — E1165 Type 2 diabetes mellitus with hyperglycemia: Secondary | ICD-10-CM | POA: Diagnosis not present

## 2021-07-13 DIAGNOSIS — M069 Rheumatoid arthritis, unspecified: Secondary | ICD-10-CM | POA: Diagnosis not present

## 2021-07-13 DIAGNOSIS — E7800 Pure hypercholesterolemia, unspecified: Secondary | ICD-10-CM | POA: Diagnosis not present

## 2021-07-13 DIAGNOSIS — R944 Abnormal results of kidney function studies: Secondary | ICD-10-CM | POA: Diagnosis not present

## 2021-07-13 LAB — LIPID PANEL
Cholesterol: 131 mg/dL (ref 0–200)
HDL: 43.3 mg/dL (ref >39.00)
LDL Cholesterol: 58 mg/dL (ref 0–99)
NonHDL: 87.33
Total CHOL/HDL Ratio: 3
Triglycerides: 145 mg/dL (ref 0.0–149.0)
VLDL: 29 mg/dL (ref 0.0–40.0)

## 2021-07-13 LAB — CBC WITH DIFFERENTIAL/PLATELET
Basophils Absolute: 0.1 10*3/uL (ref 0.0–0.1)
Basophils Relative: 2 % (ref 0.0–3.0)
Eosinophils Absolute: 0.2 10*3/uL (ref 0.0–0.7)
Eosinophils Relative: 6.1 % — ABNORMAL HIGH (ref 0.0–5.0)
HCT: 38.6 % — ABNORMAL LOW (ref 39.0–52.0)
Hemoglobin: 13.1 g/dL (ref 13.0–17.0)
Lymphocytes Relative: 23.6 % (ref 12.0–46.0)
Lymphs Abs: 0.9 10*3/uL (ref 0.7–4.0)
MCHC: 34.1 g/dL (ref 30.0–36.0)
MCV: 100.2 fl — ABNORMAL HIGH (ref 78.0–100.0)
Monocytes Absolute: 0.5 10*3/uL (ref 0.1–1.0)
Monocytes Relative: 13.7 % — ABNORMAL HIGH (ref 3.0–12.0)
Neutro Abs: 2.2 10*3/uL (ref 1.4–7.7)
Neutrophils Relative %: 54.6 % (ref 43.0–77.0)
Platelets: 183 10*3/uL (ref 150.0–400.0)
RBC: 3.85 Mil/uL — ABNORMAL LOW (ref 4.22–5.81)
RDW: 12.5 % (ref 11.5–15.5)
WBC: 3.9 10*3/uL — ABNORMAL LOW (ref 4.0–10.5)

## 2021-07-13 LAB — BASIC METABOLIC PANEL
BUN: 20 mg/dL (ref 6–23)
CO2: 28 mEq/L (ref 19–32)
Calcium: 9.4 mg/dL (ref 8.4–10.5)
Chloride: 104 mEq/L (ref 96–112)
Creatinine, Ser: 1.06 mg/dL (ref 0.40–1.50)
GFR: 69.65 mL/min (ref >60.00)
Glucose, Bld: 114 mg/dL — ABNORMAL HIGH (ref 70–99)
Potassium: 4.4 mEq/L (ref 3.5–5.1)
Sodium: 139 mEq/L (ref 135–145)

## 2021-07-13 LAB — PSA, MEDICARE: PSA: 1.06 ng/ml (ref 0.10–4.00)

## 2021-07-13 LAB — HEPATIC FUNCTION PANEL
ALT: 20 U/L (ref 0–53)
AST: 24 U/L (ref 0–37)
Albumin: 4.6 g/dL (ref 3.5–5.2)
Alkaline Phosphatase: 56 U/L (ref 39–117)
Bilirubin, Direct: 0.1 mg/dL (ref 0.0–0.3)
Total Bilirubin: 0.8 mg/dL (ref 0.2–1.2)
Total Protein: 6.8 g/dL (ref 6.0–8.3)

## 2021-07-13 LAB — TSH: TSH: 1.13 u[IU]/mL (ref 0.35–5.50)

## 2021-07-13 LAB — HEMOGLOBIN A1C: Hgb A1c MFr Bld: 6.1 % (ref 4.6–6.5)

## 2021-07-13 NOTE — Progress Notes (Signed)
Patient ID: Donald [name], male   DOB: [birth year], 75 y.o.   MRN: [id number]   Subjective:    Patient ID: Donald [name], male    DOB: [birth year], 75 y.o.   MRN: [id number]  This visit occurred during the SARS-CoV-2 public health emergency.  Safety protocols were in place, including screening questions prior to the visit, additional usage of staff PPE, and extensive cleaning of exam room while observing appropriate contact time as indicated for disinfecting solutions.   Patient here for his physical exam.   Chief Complaint  Patient presents with   Annual Exam   .   HPI He is doing well.  Tries to stay active.  Seeing Dr [name] - f/u RA.  On leflunomide.  Overall joints stable.  No chest pain or sob reported.  No abdominal pain.  Bowels moving.  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     Past Medical History:  Diagnosis Date   Carpal tunnel syndrome    Diabetes mellitus without complication (HCC)    Nephrolithiasis    H/O stones   Rheumatoid arthritis ([place])    Past Surgical History:  Procedure Laterality Date   APPENDECTOMY  1962   CARPAL TUNNEL RELEASE Bilateral    hands   Family History  Problem Relation Age of Onset   Heart disease Father    Hypertension Father    Diabetes Maternal Grandmother    Social History   Socioeconomic History   Marital status: Married    Spouse name: Not on file   Number of children: Not on file   Years of education: Not on file   Highest education level: Not on file  Occupational History   Not on file  Tobacco Use   Smoking status: Former   Smokeless tobacco: Never  Vaping Use   Vaping Use: Never used  Substance and Sexual Activity   Alcohol use: No    Alcohol/week: 0.0 standard drinks   Drug use: No   Sexual activity: Not Currently  Other Topics Concern   Not on file  Social History Narrative   Not on file   Social Determinants of Health   Financial Resource Strain: Not on file  Food Insecurity: No Food Insecurity    Worried About Running Out of Food in the Last Year: Never true   [place] in the Last Year: Never true  Transportation Needs: No Transportation Needs   Lack of Transportation (Medical): No   Lack of Transportation (Non-Medical): No  Physical Activity: Insufficiently Active   Days of Exercise per Week: 4 days   Minutes of Exercise per Session: 20 min  Stress: No Stress Concern Present   Feeling of Stress : Not at all  Social Connections: Unknown   Frequency of Communication with Friends and Family: Not on file   Frequency of Social Gatherings with Friends and Family: Not on file   Attends Religious Services: Not on file   Active Member of Clubs or Organizations: Not on file   Attends Archivist Meetings: Not on file   Marital Status: Married     Review of Systems  Constitutional:  Negative for appetite change and unexpected weight change.  HENT:  Negative for congestion, sinus pressure and sore throat.   Eyes:  Negative for pain and visual disturbance.  Respiratory:  Negative for cough, chest tightness and shortness of breath.   Cardiovascular:  Negative for chest pain and palpitations.  Gastrointestinal:  Negative for abdominal  pain, diarrhea, nausea and vomiting.  Genitourinary:  Negative for difficulty urinating and dysuria.  Musculoskeletal:  Negative for joint swelling and myalgias.  Skin:  Negative for color change and rash.  Neurological:  Negative for dizziness, light-headedness and headaches.  Hematological:  Negative for adenopathy. Does not bruise/bleed easily.  Psychiatric/Behavioral:  Negative for agitation and dysphoric mood.       Objective:     BP 132/78   Pulse 74   Temp 97.7 F (36.5 C) (Oral)   Resp 14   Ht 5' 9"  (1.753 m)   Wt 171 lb (77.6 kg)   SpO2 97%   BMI 25.25 kg/m  Wt Readings from Last 3 Encounters:  07/13/21 171 lb (77.6 kg)  02/23/21 172 lb (78 kg)  01/10/21 172 lb 9.6 oz (78.3 kg)    Physical Exam Constitutional:       General: He is not in acute distress.    Appearance: Normal appearance. He is well-developed.  HENT:     Head: Normocephalic and atraumatic.     Right Ear: External ear normal.     Left Ear: External ear normal.  Eyes:     General: No scleral icterus.       Right eye: No discharge.        Left eye: No discharge.     Conjunctiva/sclera: Conjunctivae normal.  Neck:     Thyroid: No thyromegaly.  Cardiovascular:     Rate and Rhythm: Normal rate and regular rhythm.  Pulmonary:     Effort: No respiratory distress.     Breath sounds: Normal breath sounds. No wheezing.  Abdominal:     General: Bowel sounds are normal.     Palpations: Abdomen is soft.     Tenderness: There is no abdominal tenderness.  Musculoskeletal:        General: No swelling or tenderness.     Cervical back: Neck supple. No tenderness.  Lymphadenopathy:     Cervical: No cervical adenopathy.  Skin:    Findings: No erythema or rash.  Neurological:     Mental Status: He is alert and oriented to person, place, and time.  Psychiatric:        Mood and Affect: Mood normal.        Behavior: Behavior normal.     Outpatient Encounter Medications as of 07/13/2021  Medication Sig   ACCU-CHEK AVIVA PLUS test strip TEST TWO TIMES DAILY   ACCU-CHEK SOFTCLIX LANCETS lancets TEST TWO TIMES DAILY   cetirizine (ZYRTEC) 10 MG tablet Take 10 mg by mouth as needed for allergies.   gabapentin (NEURONTIN) 100 MG capsule TAKE 1 CAPSULE AT BEDTIME   leflunomide (ARAVA) 10 MG tablet Take 10 mg by mouth daily.    rosuvastatin (CRESTOR) 5 MG tablet TAKE 1 TABLET EVERY DAY   No facility-administered encounter medications on file as of 07/13/2021.     Lab Results  Component Value Date   WBC 3.9 (L) 07/13/2021   HGB 13.1 07/13/2021   HCT 38.6 (L) 07/13/2021   PLT 183.0 07/13/2021   GLUCOSE 114 (H) 07/13/2021   CHOL 131 07/13/2021   TRIG 145.0 07/13/2021   HDL 43.30 07/13/2021   LDLCALC 58 07/13/2021   ALT 20 07/13/2021    AST 24 07/13/2021   NA 139 07/13/2021   K 4.4 07/13/2021   CL 104 07/13/2021   CREATININE 1.06 07/13/2021   BUN 20 07/13/2021   CO2 28 07/13/2021   TSH 1.13 07/13/2021   PSA 1.06 07/13/2021  HGBA1C 6.1 07/13/2021   MICROALBUR <0.7 01/06/2021       Assessment & Plan:   Problem List Items Addressed This Visit     Aortic stenosis    Mild AS noted on ECHO.  Asymptomatic.  Have discussed f/u echo.  Discussed again today. Wants to hold on f/u echo.        Decreased GFR    Recent check - 69.  Improved.  Follow.       Relevant Orders   Basic Metabolic Panel (BMET) (Completed)   Diabetes ([place])    Low carb diet and exercise.  On no medication.  Follow met b and a1c.        Relevant Orders   Basic Metabolic Panel (BMET) (Completed)   HgB A1c (Completed)   TSH (Completed)   Health care maintenance    Physical today 07/13/21.  Colonoscopy 02/2014 - internal hemorrhoids.  States due f/u 10 years.  Declines IFOB.  Check psa.       Hypercholesterolemia    Continue crestor.  Low cholesterol diet and exercise.  Follow lipid panel and liver function tests.       Relevant Orders   Lipid panel (Completed)   Hepatic function panel (Completed)   Rheumatoid arthritis ([place])    On leflunomide.  Followed by Dr [name].  Stable.       Other Visit Diagnoses     Routine general medical examination at a health care facility    -  Primary   Anemia, unspecified type       Relevant Orders   CBC w/Diff (Completed)   TSH (Completed)   Prostate cancer screening       Relevant Orders   PSA, Medicare ( [place] only) (Completed)        Einar Pheasant, MD

## 2021-07-13 NOTE — Assessment & Plan Note (Signed)
Physical today 07/13/21.  Colonoscopy 02/2014 - internal hemorrhoids.  States due f/u 10 years.  Declines IFOB.  Check psa.

## 2021-07-14 ENCOUNTER — Other Ambulatory Visit: Payer: Self-pay | Admitting: Internal Medicine

## 2021-07-14 ENCOUNTER — Other Ambulatory Visit (INDEPENDENT_AMBULATORY_CARE_PROVIDER_SITE_OTHER): Payer: Medicare HMO

## 2021-07-14 DIAGNOSIS — D72819 Decreased white blood cell count, unspecified: Secondary | ICD-10-CM | POA: Diagnosis not present

## 2021-07-14 LAB — VITAMIN B12: Vitamin B-12: 388 pg/mL (ref 211–911)

## 2021-07-14 NOTE — Progress Notes (Signed)
Order placed for B12 add on lab.

## 2021-07-15 ENCOUNTER — Other Ambulatory Visit: Payer: Self-pay | Admitting: Internal Medicine

## 2021-07-15 DIAGNOSIS — D72819 Decreased white blood cell count, unspecified: Secondary | ICD-10-CM

## 2021-07-15 NOTE — Progress Notes (Signed)
Order placed for f/u cbc.   

## 2021-07-17 ENCOUNTER — Telehealth: Payer: Self-pay

## 2021-07-17 DIAGNOSIS — D72819 Decreased white blood cell count, unspecified: Secondary | ICD-10-CM

## 2021-07-17 NOTE — Telephone Encounter (Signed)
-----   Message from Einar Pheasant, MD sent at 07/15/2021  6:03 PM EDT ----- Notify - overall sugar control is stable.  Cholesterol levels look good.  PSA - ok.  White count is stable.  Hgb wnl.  B12 level ok, but at lower end of normal.  Start oral B12 1061mcg q day.  Recheck cbc in 2 months.

## 2021-07-19 NOTE — Addendum Note (Signed)
Addended by: Elpidio Galea T on: 07/19/2021 02:42 PM   Modules accepted: Orders

## 2021-07-19 NOTE — Telephone Encounter (Signed)
Pt called in regards to test results. Pt would like to be called back at [phone]

## 2021-07-22 ENCOUNTER — Encounter: Payer: Self-pay | Admitting: Internal Medicine

## 2021-07-22 NOTE — Assessment & Plan Note (Signed)
Continue crestor.  Low cholesterol diet and exercise. Follow lipid panel and liver function tests.   

## 2021-07-22 NOTE — Assessment & Plan Note (Signed)
Mild AS noted on ECHO.  Asymptomatic.  Have discussed f/u echo.  Discussed again today. Wants to hold on f/u echo.

## 2021-07-22 NOTE — Assessment & Plan Note (Signed)
On leflunomide.  Followed by Dr [name].  Stable.

## 2021-07-22 NOTE — Assessment & Plan Note (Signed)
Low carb diet and exercise.  On no medication.  Follow met b and a1c.   

## 2021-07-22 NOTE — Assessment & Plan Note (Signed)
Recent check - 69.  Improved.  Follow.

## 2021-08-08 DIAGNOSIS — M0579 Rheumatoid arthritis with rheumatoid factor of multiple sites without organ or systems involvement: Secondary | ICD-10-CM | POA: Diagnosis not present

## 2021-08-08 DIAGNOSIS — Z79899 Other long term (current) drug therapy: Secondary | ICD-10-CM | POA: Diagnosis not present

## 2021-09-18 ENCOUNTER — Other Ambulatory Visit (INDEPENDENT_AMBULATORY_CARE_PROVIDER_SITE_OTHER): Payer: Medicare HMO

## 2021-09-18 ENCOUNTER — Other Ambulatory Visit: Payer: Self-pay

## 2021-09-18 DIAGNOSIS — D72819 Decreased white blood cell count, unspecified: Secondary | ICD-10-CM

## 2021-09-18 LAB — CBC WITH DIFFERENTIAL/PLATELET
Basophils Absolute: 0 10*3/uL (ref 0.0–0.1)
Basophils Relative: 0.9 % (ref 0.0–3.0)
Eosinophils Absolute: 0.4 10*3/uL (ref 0.0–0.7)
Eosinophils Relative: 6.8 % — ABNORMAL HIGH (ref 0.0–5.0)
HCT: 41.1 % (ref 39.0–52.0)
Hemoglobin: 13.9 g/dL (ref 13.0–17.0)
Lymphocytes Relative: 19.9 % (ref 12.0–46.0)
Lymphs Abs: 1 10*3/uL (ref 0.7–4.0)
MCHC: 33.8 g/dL (ref 30.0–36.0)
MCV: 99.2 fl (ref 78.0–100.0)
Monocytes Absolute: 0.6 10*3/uL (ref 0.1–1.0)
Monocytes Relative: 11.8 % (ref 3.0–12.0)
Neutro Abs: 3.2 10*3/uL (ref 1.4–7.7)
Neutrophils Relative %: 60.6 % (ref 43.0–77.0)
Platelets: 190 10*3/uL (ref 150.0–400.0)
RBC: 4.14 Mil/uL — ABNORMAL LOW (ref 4.22–5.81)
RDW: 12.3 % (ref 11.5–15.5)
WBC: 5.2 10*3/uL (ref 4.0–10.5)

## 2021-09-20 DIAGNOSIS — D2261 Melanocytic nevi of right upper limb, including shoulder: Secondary | ICD-10-CM | POA: Diagnosis not present

## 2021-09-20 DIAGNOSIS — D225 Melanocytic nevi of trunk: Secondary | ICD-10-CM | POA: Diagnosis not present

## 2021-09-20 DIAGNOSIS — D2262 Melanocytic nevi of left upper limb, including shoulder: Secondary | ICD-10-CM | POA: Diagnosis not present

## 2021-09-20 DIAGNOSIS — X32XXXA Exposure to sunlight, initial encounter: Secondary | ICD-10-CM | POA: Diagnosis not present

## 2021-09-20 DIAGNOSIS — Z85828 Personal history of other malignant neoplasm of skin: Secondary | ICD-10-CM | POA: Diagnosis not present

## 2021-09-20 DIAGNOSIS — L821 Other seborrheic keratosis: Secondary | ICD-10-CM | POA: Diagnosis not present

## 2021-09-20 DIAGNOSIS — L570 Actinic keratosis: Secondary | ICD-10-CM | POA: Diagnosis not present

## 2021-11-07 DIAGNOSIS — Z79899 Other long term (current) drug therapy: Secondary | ICD-10-CM | POA: Diagnosis not present

## 2021-11-07 DIAGNOSIS — M0579 Rheumatoid arthritis with rheumatoid factor of multiple sites without organ or systems involvement: Secondary | ICD-10-CM | POA: Diagnosis not present

## 2021-11-15 DIAGNOSIS — Z79899 Other long term (current) drug therapy: Secondary | ICD-10-CM | POA: Diagnosis not present

## 2021-11-15 DIAGNOSIS — M0579 Rheumatoid arthritis with rheumatoid factor of multiple sites without organ or systems involvement: Secondary | ICD-10-CM | POA: Diagnosis not present

## 2021-11-25 ENCOUNTER — Other Ambulatory Visit: Payer: Self-pay | Admitting: Internal Medicine

## 2022-01-10 ENCOUNTER — Ambulatory Visit (INDEPENDENT_AMBULATORY_CARE_PROVIDER_SITE_OTHER): Payer: Medicare HMO | Admitting: Internal Medicine

## 2022-01-10 ENCOUNTER — Encounter: Payer: Self-pay | Admitting: Internal Medicine

## 2022-01-10 VITALS — BP 130/72 | HR 73 | Temp 97.6°F | Resp 14 | Ht 69.0 in | Wt 170.0 lb

## 2022-01-10 DIAGNOSIS — D649 Anemia, unspecified: Secondary | ICD-10-CM | POA: Diagnosis not present

## 2022-01-10 DIAGNOSIS — E7800 Pure hypercholesterolemia, unspecified: Secondary | ICD-10-CM

## 2022-01-10 DIAGNOSIS — M069 Rheumatoid arthritis, unspecified: Secondary | ICD-10-CM

## 2022-01-10 DIAGNOSIS — E1165 Type 2 diabetes mellitus with hyperglycemia: Secondary | ICD-10-CM

## 2022-01-10 DIAGNOSIS — I350 Nonrheumatic aortic (valve) stenosis: Secondary | ICD-10-CM | POA: Diagnosis not present

## 2022-01-10 LAB — CBC WITH DIFFERENTIAL/PLATELET
Basophils Absolute: 0 10*3/uL (ref 0.0–0.1)
Basophils Relative: 0.7 % (ref 0.0–3.0)
Eosinophils Absolute: 0.3 10*3/uL (ref 0.0–0.7)
Eosinophils Relative: 4.5 % (ref 0.0–5.0)
HCT: 38.8 % — ABNORMAL LOW (ref 39.0–52.0)
Hemoglobin: 13.4 g/dL (ref 13.0–17.0)
Lymphocytes Relative: 14.5 % (ref 12.0–46.0)
Lymphs Abs: 0.9 10*3/uL (ref 0.7–4.0)
MCHC: 34.4 g/dL (ref 30.0–36.0)
MCV: 100.5 fl — ABNORMAL HIGH (ref 78.0–100.0)
Monocytes Absolute: 0.8 10*3/uL (ref 0.1–1.0)
Monocytes Relative: 12 % (ref 3.0–12.0)
Neutro Abs: 4.3 10*3/uL (ref 1.4–7.7)
Neutrophils Relative %: 68.3 % (ref 43.0–77.0)
Platelets: 180 10*3/uL (ref 150.0–400.0)
RBC: 3.86 Mil/uL — ABNORMAL LOW (ref 4.22–5.81)
RDW: 12.6 % (ref 11.5–15.5)
WBC: 6.3 10*3/uL (ref 4.0–10.5)

## 2022-01-10 LAB — BASIC METABOLIC PANEL
BUN: 23 mg/dL (ref 6–23)
CO2: 27 mEq/L (ref 19–32)
Calcium: 9.1 mg/dL (ref 8.4–10.5)
Chloride: 105 mEq/L (ref 96–112)
Creatinine, Ser: 1.22 mg/dL (ref 0.40–1.50)
GFR: 58.63 mL/min — ABNORMAL LOW (ref >60.00)
Glucose, Bld: 114 mg/dL — ABNORMAL HIGH (ref 70–99)
Potassium: 4.9 mEq/L (ref 3.5–5.1)
Sodium: 138 mEq/L (ref 135–145)

## 2022-01-10 LAB — LIPID PANEL
Cholesterol: 136 mg/dL (ref 0–200)
HDL: 42.7 mg/dL (ref >39.00)
LDL Cholesterol: 59 mg/dL (ref 0–99)
NonHDL: 93.18
Total CHOL/HDL Ratio: 3
Triglycerides: 173 mg/dL — ABNORMAL HIGH (ref 0.0–149.0)
VLDL: 34.6 mg/dL (ref 0.0–40.0)

## 2022-01-10 LAB — MICROALBUMIN / CREATININE URINE RATIO
Creatinine,U: 220.6 mg/dL
Microalb Creat Ratio: 1.9 mg/g (ref 0.0–30.0)
Microalb, Ur: 4.1 mg/dL — ABNORMAL HIGH (ref 0.0–1.9)

## 2022-01-10 LAB — HEPATIC FUNCTION PANEL
ALT: 16 U/L (ref 0–53)
AST: 20 U/L (ref 0–37)
Albumin: 4.6 g/dL (ref 3.5–5.2)
Alkaline Phosphatase: 58 U/L (ref 39–117)
Bilirubin, Direct: 0.2 mg/dL (ref 0.0–0.3)
Total Bilirubin: 0.8 mg/dL (ref 0.2–1.2)
Total Protein: 6.9 g/dL (ref 6.0–8.3)

## 2022-01-10 LAB — HEMOGLOBIN A1C: Hgb A1c MFr Bld: 5.9 % (ref 4.6–6.5)

## 2022-01-10 NOTE — Progress Notes (Signed)
Patient ID: Donald [name], male   DOB: 04/27/1946, 76 y.o.   MRN: [id number] ? ? ?Subjective:  ? ? Patient ID: Donald [name], male    DOB: 14-Jun-1946, 76 y.o.   MRN: [id number] ? ?This visit occurred during the SARS-CoV-2 public health emergency.  Safety protocols were in place, including screening questions prior to the visit, additional usage of staff PPE, and extensive cleaning of exam room while observing appropriate contact time as indicated for disinfecting solutions.  ? ?Patient here for a scheduled follow up.  ? ?Chief Complaint  ?Patient presents with  ? Follow-up  ?  80mof/u for D.M  ? Diabetes  ? .  ? ?HPI ?Doing well.  Blood sugar doing well - averaging 100-120 in am.  No chest pain or sob reported.  Tries to stay active.  No acid reflux or abdominal pain reported.  Bowels stable.  Sees rheumatology.  On arava.  Stable.   ? ? ?Past Medical History:  ?Diagnosis Date  ? Carpal tunnel syndrome   ? Diabetes mellitus without complication ([place]   ? Nephrolithiasis   ? H/O stones  ? Rheumatoid arthritis ([place]   ? ?Past Surgical History:  ?Procedure Laterality Date  ? APPENDECTOMY  1962  ? CARPAL TUNNEL RELEASE Bilateral   ? hands  ? ?Family History  ?Problem Relation Age of Onset  ? Heart disease Father   ? Hypertension Father   ? Diabetes Maternal Grandmother   ? ?Social History  ? ?Socioeconomic History  ? Marital status: Married  ?  Spouse name: Not on file  ? Number of children: Not on file  ? Years of education: Not on file  ? Highest education level: Not on file  ?Occupational History  ? Not on file  ?Tobacco Use  ? Smoking status: Former  ? Smokeless tobacco: Never  ?Vaping Use  ? Vaping Use: Never used  ?Substance and Sexual Activity  ? Alcohol use: No  ?  Alcohol/week: 0.0 standard drinks  ? Drug use: No  ? Sexual activity: Not Currently  ?Other Topics Concern  ? Not on file  ?Social History Narrative  ? Not on file  ? ?Social Determinants of Health  ? ?Financial Resource Strain: Not on file  ?Food  Insecurity: No Food Insecurity  ? Worried About RCharity fundraiserin the Last Year: Never true  ? Ran Out of Food in the Last Year: Never true  ?Transportation Needs: No Transportation Needs  ? Lack of Transportation (Medical): No  ? Lack of Transportation (Non-Medical): No  ?Physical Activity: Insufficiently Active  ? Days of Exercise per Week: 4 days  ? Minutes of Exercise per Session: 20 min  ?Stress: No Stress Concern Present  ? Feeling of Stress : Not at all  ?Social Connections: Unknown  ? Frequency of Communication with Friends and Family: Not on file  ? Frequency of Social Gatherings with Friends and Family: Not on file  ? Attends Religious Services: Not on file  ? Active Member of Clubs or Organizations: Not on file  ? Attends CArchivistMeetings: Not on file  ? Marital Status: Married  ? ? ? ?Review of Systems  ?Constitutional:  Negative for appetite change and unexpected weight change.  ?HENT:  Negative for congestion and sinus pressure.   ?Respiratory:  Negative for cough, chest tightness and shortness of breath.   ?Cardiovascular:  Negative for chest pain, palpitations and leg swelling.  ?Gastrointestinal:  Negative for abdominal pain, diarrhea,  nausea and vomiting.  ?Genitourinary:  Negative for difficulty urinating and dysuria.  ?Musculoskeletal:  Negative for joint swelling and myalgias.  ?Skin:  Negative for color change and rash.  ?Neurological:  Negative for dizziness, light-headedness and headaches.  ?Psychiatric/Behavioral:  Negative for agitation and dysphoric mood.   ? ?   ?Objective:  ?  ? ?BP 130/72 (BP Location: Left Arm, Patient Position: Sitting, Cuff Size: Small)   Pulse 73   Temp 97.6 ?F (36.4 ?C) (Temporal)   Resp 14   Ht 5' 9"  (1.753 m)   Wt 170 lb (77.1 kg)   SpO2 98%   BMI 25.10 kg/m?  ?Wt Readings from Last 3 Encounters:  ?01/10/22 170 lb (77.1 kg)  ?07/13/21 171 lb (77.6 kg)  ?02/23/21 172 lb (78 kg)  ? ? ?Physical Exam ?Constitutional:   ?   General: He is not  in acute distress. ?   Appearance: Normal appearance. He is well-developed.  ?HENT:  ?   Head: Normocephalic and atraumatic.  ?   Right Ear: External ear normal.  ?   Left Ear: External ear normal.  ?Eyes:  ?   General: No scleral icterus.    ?   Right eye: No discharge.     ?   Left eye: No discharge.  ?Cardiovascular:  ?   Rate and Rhythm: Normal rate and regular rhythm.  ?Pulmonary:  ?   Effort: Pulmonary effort is normal. No respiratory distress.  ?   Breath sounds: Normal breath sounds.  ?Abdominal:  ?   General: Bowel sounds are normal.  ?   Palpations: Abdomen is soft.  ?   Tenderness: There is no abdominal tenderness.  ?Musculoskeletal:     ?   General: No swelling or tenderness.  ?   Cervical back: Neck supple. No tenderness.  ?Lymphadenopathy:  ?   Cervical: No cervical adenopathy.  ?Skin: ?   Findings: No erythema or rash.  ?Neurological:  ?   Mental Status: He is alert.  ?Psychiatric:     ?   Mood and Affect: Mood normal.     ?   Behavior: Behavior normal.  ? ? ? ?Outpatient Encounter Medications as of 01/10/2022  ?Medication Sig  ? ACCU-CHEK AVIVA PLUS test strip TEST TWO TIMES DAILY  ? ACCU-CHEK SOFTCLIX LANCETS lancets TEST TWO TIMES DAILY  ? cetirizine (ZYRTEC) 10 MG tablet Take 10 mg by mouth as needed for allergies.  ? gabapentin (NEURONTIN) 100 MG capsule TAKE 1 CAPSULE AT BEDTIME  ? leflunomide (ARAVA) 10 MG tablet Take 10 mg by mouth daily.   ? rosuvastatin (CRESTOR) 5 MG tablet TAKE 1 TABLET EVERY DAY  ? ?No facility-administered encounter medications on file as of 01/10/2022.  ?  ? ?Lab Results  ?Component Value Date  ? WBC 6.3 01/10/2022  ? HGB 13.4 01/10/2022  ? HCT 38.8 (L) 01/10/2022  ? PLT 180.0 01/10/2022  ? GLUCOSE 114 (H) 01/10/2022  ? CHOL 136 01/10/2022  ? TRIG 173.0 (H) 01/10/2022  ? HDL 42.70 01/10/2022  ? LDLCALC 59 01/10/2022  ? ALT 16 01/10/2022  ? AST 20 01/10/2022  ? NA 138 01/10/2022  ? K 4.9 01/10/2022  ? CL 105 01/10/2022  ? CREATININE 1.22 01/10/2022  ? BUN 23 01/10/2022  ?  CO2 27 01/10/2022  ? TSH 1.13 07/13/2021  ? PSA 1.06 07/13/2021  ? HGBA1C 5.9 01/10/2022  ? MICROALBUR 4.1 (H) 01/10/2022  ? ? ?   ?Assessment & Plan:  ? ?Problem List Items  Addressed This Visit   ? ? Anemia  ?  Follow cbc.  ? ?  ?  ? Relevant Orders  ? CBC with Differential/Platelet (Completed)  ? IBC + Ferritin  ? Aortic stenosis  ?  Mild AS noted on ECHO.  Asymptomatic.  Have discussed f/u echo.  Discussed f/u with cardiology - to follow AS.  ? ?  ?  ? Relevant Orders  ? Ambulatory referral to Cardiology  ? Diabetes ([place]) - Primary  ?  Low carb diet and exercise.  On no medication.  Follow met b and a1c.   ? ?  ?  ? Relevant Orders  ? Basic Metabolic Panel (BMET) (Completed)  ? HgB A1c (Completed)  ? Microalbumin / creatinine urine ratio (Completed)  ? Hypercholesterolemia  ?  Continue crestor.  Low cholesterol diet and exercise.  Follow lipid panel and liver function tests.  ? ?  ?  ? Relevant Orders  ? Lipid Profile (Completed)  ? Hepatic function panel (Completed)  ? Rheumatoid arthritis ([place])  ?  On leflunomide.  Followed by Dr [name].  Stable.  ? ?  ?  ? ? ? ?Einar Pheasant, MD  ?

## 2022-01-14 ENCOUNTER — Encounter: Payer: Self-pay | Admitting: Internal Medicine

## 2022-01-14 NOTE — Assessment & Plan Note (Signed)
Continue crestor.  Low cholesterol diet and exercise. Follow lipid panel and liver function tests.   

## 2022-01-14 NOTE — Assessment & Plan Note (Signed)
Low carb diet and exercise.  On no medication.  Follow met b and a1c.   ?

## 2022-01-14 NOTE — Assessment & Plan Note (Signed)
On leflunomide.  Followed by Dr [name].  Stable.  ?

## 2022-01-14 NOTE — Assessment & Plan Note (Signed)
Follow cbc.  

## 2022-01-14 NOTE — Assessment & Plan Note (Signed)
Mild AS noted on ECHO.  Asymptomatic.  Have discussed f/u echo.  Discussed f/u with cardiology - to follow AS.  ?

## 2022-01-16 ENCOUNTER — Other Ambulatory Visit: Payer: Self-pay

## 2022-01-16 DIAGNOSIS — E1165 Type 2 diabetes mellitus with hyperglycemia: Secondary | ICD-10-CM

## 2022-02-12 ENCOUNTER — Other Ambulatory Visit (INDEPENDENT_AMBULATORY_CARE_PROVIDER_SITE_OTHER): Payer: Medicare HMO

## 2022-02-12 DIAGNOSIS — E1165 Type 2 diabetes mellitus with hyperglycemia: Secondary | ICD-10-CM | POA: Diagnosis not present

## 2022-02-12 LAB — BASIC METABOLIC PANEL
BUN: 24 mg/dL — ABNORMAL HIGH (ref 6–23)
CO2: 25 mEq/L (ref 19–32)
Calcium: 9.4 mg/dL (ref 8.4–10.5)
Chloride: 106 mEq/L (ref 96–112)
Creatinine, Ser: 1.16 mg/dL (ref 0.40–1.50)
GFR: 62.25 mL/min (ref >60.00)
Glucose, Bld: 137 mg/dL — ABNORMAL HIGH (ref 70–99)
Potassium: 4.1 mEq/L (ref 3.5–5.1)
Sodium: 140 mEq/L (ref 135–145)

## 2022-02-19 ENCOUNTER — Encounter: Payer: Self-pay | Admitting: Cardiology

## 2022-02-19 ENCOUNTER — Ambulatory Visit: Payer: Medicare HMO | Admitting: Cardiology

## 2022-02-19 VITALS — BP 128/70 | HR 87 | Ht 70.0 in | Wt 172.0 lb

## 2022-02-19 DIAGNOSIS — E782 Mixed hyperlipidemia: Secondary | ICD-10-CM

## 2022-02-19 DIAGNOSIS — I350 Nonrheumatic aortic (valve) stenosis: Secondary | ICD-10-CM | POA: Diagnosis not present

## 2022-02-19 NOTE — Progress Notes (Signed)
Cardiology Office Note:    Date:  02/19/2022   ID:  Donald [name], DOB 11-15-1945, MRN [id number]  PCP:  Einar Pheasant, MD   [facility] Providers Cardiologist:  None     Referring MD: Einar Pheasant, MD   Chief Complaint  Patient presents with   NEW patient-Referred by Dr. Nicki Reaper for cardiac eval    Hx of aortic valve stenosis    History of Present Illness:    Donald [name] is a 75 y.o. male with a hx of hyperlipidemia, aortic valve stenosis who presents due to aortic valve stenosis.  Patient had an echocardiogram 2018 due to cardiac murmur.  EF 55 to 60%, mild stenosis noted in the aortic valve.  He states doing okay since his last echocardiogram 5 years ago.  Denies chest pain or shortness of breath.  Denies edema.  Takes cholesterol medications as prescribed.  Feels well, has no concerns at this time.  Past Medical History:  Diagnosis Date   Carpal tunnel syndrome    Diabetes mellitus without complication ([place])    Nephrolithiasis    H/O stones   Rheumatoid arthritis ([place])     Past Surgical History:  Procedure Laterality Date   APPENDECTOMY  1962   CARPAL TUNNEL RELEASE Bilateral    hands    Current Medications: Current Meds  Medication Sig   ACCU-CHEK AVIVA PLUS test strip TEST TWO TIMES DAILY   ACCU-CHEK SOFTCLIX LANCETS lancets TEST TWO TIMES DAILY   cetirizine (ZYRTEC) 10 MG tablet Take 10 mg by mouth as needed for allergies.   gabapentin (NEURONTIN) 100 MG capsule TAKE 1 CAPSULE AT BEDTIME   leflunomide (ARAVA) 10 MG tablet Take 10 mg by mouth daily.    rosuvastatin (CRESTOR) 5 MG tablet TAKE 1 TABLET EVERY DAY     Allergies:   Patient has no known allergies.   Social History   Socioeconomic History   Marital status: Married    Spouse name: Not on file   Number of children: Not on file   Years of education: Not on file   Highest education level: Not on file  Occupational History   Not on file  Tobacco Use   Smoking status: Former    Smokeless tobacco: Never  Vaping Use   Vaping Use: Never used  Substance and Sexual Activity   Alcohol use: No    Alcohol/week: 0.0 standard drinks of alcohol   Drug use: No   Sexual activity: Not Currently  Other Topics Concern   Not on file  Social History Narrative   Not on file   Social Determinants of Health   Financial Resource Strain: Low Risk  (12/13/2017)   Overall Financial Resource Strain (CARDIA)    Difficulty of Paying Living Expenses: Not hard at all  Food Insecurity: No Food Insecurity (02/23/2021)   Hunger Vital Sign    Worried About Running Out of Food in the Last Year: Never true    [place] in the Last Year: Never true  Transportation Needs: No Transportation Needs (02/23/2021)   PRAPARE - Hydrologist (Medical): No    Lack of Transportation (Non-Medical): No  Physical Activity: Insufficiently Active (02/23/2021)   Exercise Vital Sign    Days of Exercise per Week: 4 days    Minutes of Exercise per Session: 20 min  Stress: No Stress Concern Present (02/23/2021)   [place]    Feeling of Stress :  Not at all  Social Connections: Unknown (02/23/2021)   Social Connection and Isolation Panel [NHANES]    Frequency of Communication with Friends and Family: Not on file    Frequency of Social Gatherings with Friends and Family: Not on file    Attends Religious Services: Not on file    Active Member of Clubs or Organizations: Not on file    Attends Archivist Meetings: Not on file    Marital Status: Married     Family History: The patient's family history includes Diabetes in his maternal grandmother; Heart disease in his father; Hypertension in his father.  ROS:   Please see the history of present illness.     All other systems reviewed and are negative.  EKGs/Labs/Other Studies Reviewed:    The following studies were reviewed today:   EKG:  EKG is   ordered today.  The ekg ordered today demonstrates normal sinus rhythm, inferior infarct.  Recent Labs: 07/13/2021: TSH 1.13 01/10/2022: ALT 16; Hemoglobin 13.4; Platelets 180.0 02/12/2022: BUN 24; Creatinine, Ser 1.16; Potassium 4.1; Sodium 140  Recent Lipid Panel    Component Value Date/Time   CHOL 136 01/10/2022 0915   TRIG 173.0 (H) 01/10/2022 0915   HDL 42.70 01/10/2022 0915   CHOLHDL 3 01/10/2022 0915   VLDL 34.6 01/10/2022 0915   LDLCALC 59 01/10/2022 0915     Risk Assessment/Calculations:         Physical Exam:    VS:  BP 128/70 (BP Location: Right Arm, Patient Position: Sitting, Cuff Size: Normal)   Pulse 87   Ht '5\' 10"'$  (1.778 m)   Wt 172 lb (78 kg)   SpO2 98%   BMI 24.68 kg/m     Wt Readings from Last 3 Encounters:  02/19/22 172 lb (78 kg)  01/10/22 170 lb (77.1 kg)  07/13/21 171 lb (77.6 kg)     GEN:  Well nourished, well developed in no acute distress HEENT: Normal NECK: No JVD; No carotid bruits LYMPHATICS: No lymphadenopathy CARDIAC: RRR, 2/6 systolic murmur RESPIRATORY:  Clear to auscultation without rales, wheezing or rhonchi  ABDOMEN: Soft, non-tender, non-distended MUSCULOSKELETAL:  No edema; No deformity  SKIN: Warm and dry NEUROLOGIC:  Alert and oriented x 3 PSYCHIATRIC:  Normal affect   ASSESSMENT:    1. Aortic valve stenosis, etiology of cardiac valve disease unspecified   2. Mixed hyperlipidemia    PLAN:    In order of problems listed above:  Aortic valve stenosis, mild on last echo 2018.  Repeat echocardiogram to evaluate any progression of valvular disease. Hyperlipidemia, continue statin.  Follow-up after echo      Medication Adjustments/Labs and Tests Ordered: Current medicines are reviewed at length with the patient today.  Concerns regarding medicines are outlined above.  Orders Placed This Encounter  Procedures   EKG 12-Lead   ECHOCARDIOGRAM COMPLETE   No orders of the defined types were placed in this  encounter.   Patient Instructions  Medication Instructions:   Your physician recommends that you continue on your current medications as directed. Please refer to the Current Medication list given to you today.  *If you need a refill on your cardiac medications before your next appointment, please call your pharmacy*    Testing/Procedures:  Your physician has requested that you have an echocardiogram. Echocardiography is a painless test that uses sound waves to create images of your heart. It provides your doctor with information about the size and shape of your heart and how well your  heart's chambers and valves are working. This procedure takes approximately one hour. There are no restrictions for this procedure.    Follow-Up: At [facility], you and your health needs are our priority.  As part of our continuing mission to provide you with exceptional heart care, we have created designated Provider Care Teams.  These Care Teams include your primary Cardiologist (physician) and Advanced Practice Providers (APPs -  Physician Assistants and Nurse Practitioners) who all work together to provide you with the care you need, when you need it.  We recommend signing up for the patient portal called "MyChart".  Sign up information is provided on this After Visit Summary.  MyChart is used to connect with patients for Virtual Visits (Telemedicine).  Patients are able to view lab/test results, encounter notes, upcoming appointments, etc.  Non-urgent messages can be sent to your provider as well.   To learn more about what you can do with MyChart, go to [email or web address].    Your next appointment:   Follow up after testing  The format for your next appointment:   In Person  Provider:   You may see Kate Sable, MD or one of the following Advanced Practice Providers on your designated Care Team:   Murray Hodgkins, NP Christell Faith, PA-C Cadence Kathlen Mody, [place]    Other  Instructions   Important Information About Sugar         Signed, Kate Sable, MD  02/19/2022 10:34 AM    [place]

## 2022-02-19 NOTE — Patient Instructions (Signed)
Medication Instructions:   Your physician recommends that you continue on your current medications as directed. Please refer to the Current Medication list given to you today.  *If you need a refill on your cardiac medications before your next appointment, please call your pharmacy*    Testing/Procedures:  Your physician has requested that you have an echocardiogram. Echocardiography is a painless test that uses sound waves to create images of your heart. It provides your doctor with information about the size and shape of your heart and how well your heart's chambers and valves are working. This procedure takes approximately one hour. There are no restrictions for this procedure.    Follow-Up: At [facility], you and your health needs are our priority.  As part of our continuing mission to provide you with exceptional heart care, we have created designated Provider Care Teams.  These Care Teams include your primary Cardiologist (physician) and Advanced Practice Providers (APPs -  Physician Assistants and Nurse Practitioners) who all work together to provide you with the care you need, when you need it.  We recommend signing up for the patient portal called "MyChart".  Sign up information is provided on this After Visit Summary.  MyChart is used to connect with patients for Virtual Visits (Telemedicine).  Patients are able to view lab/test results, encounter notes, upcoming appointments, etc.  Non-urgent messages can be sent to your provider as well.   To learn more about what you can do with MyChart, go to [email or web address].    Your next appointment:   Follow up after testing   The format for your next appointment:   In Person  Provider:   You may see Brian Agbor-Etang, MD or one of the following Advanced Practice Providers on your designated Care Team:   Christopher Berge, NP Ryan Dunn, PA-C Cadence Furth, PA-C    Other Instructions   Important Information About  Sugar       

## 2022-02-20 ENCOUNTER — Other Ambulatory Visit: Payer: Self-pay | Admitting: Internal Medicine

## 2022-02-21 ENCOUNTER — Ambulatory Visit (INDEPENDENT_AMBULATORY_CARE_PROVIDER_SITE_OTHER): Payer: Medicare HMO

## 2022-02-21 DIAGNOSIS — I350 Nonrheumatic aortic (valve) stenosis: Secondary | ICD-10-CM | POA: Diagnosis not present

## 2022-02-21 LAB — ECHOCARDIOGRAM COMPLETE
AR max vel: 0.89 cm2
AV Area VTI: 0.97 cm2
AV Area mean vel: 0.86 cm2
AV Mean grad: 24.3 mmHg
AV Peak grad: 42.8 mmHg
Ao pk vel: 3.27 m/s
Area-P 1/2: 2.54 cm2
Calc EF: 50.9 %
S' Lateral: 3.7 cm
Single Plane A2C EF: 50.4 %
Single Plane A4C EF: 50 %

## 2022-02-26 ENCOUNTER — Ambulatory Visit (INDEPENDENT_AMBULATORY_CARE_PROVIDER_SITE_OTHER): Payer: Medicare HMO

## 2022-02-26 VITALS — Ht 70.0 in | Wt 172.0 lb

## 2022-02-26 DIAGNOSIS — Z0000 Encounter for general adult medical examination without abnormal findings: Secondary | ICD-10-CM | POA: Diagnosis not present

## 2022-02-26 NOTE — Patient Instructions (Addendum)
  [name] , Thank you for taking time to come for your Medicare Wellness Visit. I appreciate your ongoing commitment to your health goals. Please review the following plan we discussed and let me know if I can assist you in the future.   These are the goals we discussed:  Goals      DIET - REDUCE SUGAR INTAKE     Low carb diet.        This is a list of the screening recommended for you and due dates:  Health Maintenance  Topic Date Due   Zoster (Shingles) Vaccine (1 of 2) 04/12/2022*   Eye exam for diabetics  05/11/2022*   Tetanus Vaccine  07/13/2022*   Pneumonia Vaccine (3 - PPSV23 if available, else PCV20) 01/11/2023*   Flu Shot  04/10/2022   Hemoglobin A1C  07/13/2022   Complete foot exam   01/11/2023   Urine Protein Check  01/11/2023   Colon Cancer Screening  02/09/2024   Hepatitis C Screening: USPSTF Recommendation to screen - Ages 18-79 yo.  Completed   HPV Vaccine  Aged Out   COVID-19 Vaccine  Discontinued  *Topic was postponed. The date shown is not the original due date.

## 2022-02-26 NOTE — Progress Notes (Signed)
Subjective:   Donald [name] is a 76 y.o. male who presents for Medicare Annual/Subsequent preventive examination.  Review of Systems    No ROS.  Medicare Wellness Virtual Visit.  Visual/audio telehealth visit, UTA vital signs.   See social history for additional risk factors.   Cardiac Risk Factors include: advanced age (>41mn, >>54women);male gender;diabetes mellitus     Objective:    Today's Vitals   02/26/22 1239  Weight: 172 lb (78 kg)  Height: '5\' 10"'$  (1.778 m)   Body mass index is 24.68 kg/m.     02/26/2022   12:41 PM 02/23/2021    9:46 AM 02/23/2020    8:56 AM 02/13/2019   11:46 AM 12/13/2017   10:36 AM 12/12/2016   10:23 AM 12/13/2015    9:41 AM  Advanced Directives  Does Patient Have a Medical Advance Directive? No No No No No No No  Does patient want to make changes to medical advance directive?      Yes (MAU/Ambulatory/Procedural Areas - Information given)   Would patient like information on creating a medical advance directive? No - Patient declined No - Patient declined No - Patient declined Yes (MAU/Ambulatory/Procedural Areas - Information given) No - Patient declined  No - patient declined information    Current Medications (verified) Outpatient Encounter Medications as of 02/26/2022  Medication Sig   ACCU-CHEK AVIVA PLUS test strip TEST TWO TIMES DAILY   ACCU-CHEK SOFTCLIX LANCETS lancets TEST TWO TIMES DAILY   cetirizine (ZYRTEC) 10 MG tablet Take 10 mg by mouth as needed for allergies.   gabapentin (NEURONTIN) 100 MG capsule TAKE 1 CAPSULE AT BEDTIME   leflunomide (ARAVA) 10 MG tablet Take 10 mg by mouth daily.    rosuvastatin (CRESTOR) 5 MG tablet TAKE 1 TABLET EVERY DAY   No facility-administered encounter medications on file as of 02/26/2022.    Allergies (verified) Patient has no known allergies.   History: Past Medical History:  Diagnosis Date   Carpal tunnel syndrome    [place] ([place]    Nephrolithiasis    H/O  stones   [place]oid [place]    Past Surgical History:  Procedure Laterality Date   APPENDECTOMY  1962   CARPAL TUNNEL RELEASE Bilateral    hands   Family History  Problem Relation Age of Onset   Heart disease Father    Hypertension Father    Diabetes Maternal Grandmother    Social History   Socioeconomic History   Marital status: Married    Spouse name: Not on file   Number of children: Not on file   Years of education: Not on file   Highest education level: Not on file  Occupational History   Not on file  Tobacco Use   Smoking status: Former   Smokeless tobacco: Never  Vaping Use   Vaping Use: Never used  Substance and Sexual Activity   Alcohol use: No    Alcohol/week: 0.0 standard drinks of alcohol   Drug use: No   Sexual activity: Not Currently  Other Topics Concern   Not on file  Social History Narrative   Not on file   Social Determinants of Health   Financial Resource Strain: Low Risk  (12/13/2017)   Overall Financial Resource Strain (CARDIA)    Difficulty of Paying Living Expenses: Not hard at all  Food Insecurity: No Food Insecurity (02/26/2022)   Hunger Vital Sign    Worried About ROtisin the Last Year:  Never true    Ran Out of Food in the Last Year: Never true  Transportation Needs: No Transportation Needs (02/26/2022)   PRAPARE - Hydrologist (Medical): No    Lack of Transportation (Non-Medical): No  Physical Activity: Insufficiently Active (02/26/2022)   Exercise Vital Sign    Days of Exercise per Week: 4 days    Minutes of Exercise per Session: 20 min  Stress: No Stress Concern Present (02/26/2022)   [place]    Feeling of Stress : Not at all  Social Connections: Unknown (02/23/2021)   Social Connection and Isolation Panel [NHANES]    Frequency of Communication with Friends and Family: Not on file    Frequency of Social Gatherings  with Friends and Family: Not on file    Attends Religious Services: Not on file    Active Member of Clubs or Organizations: Not on file    Attends Archivist Meetings: Not on file    Marital Status: Married    Tobacco Counseling Counseling given: Not Answered   Clinical Intake:  Pre-visit preparation completed: Yes        Diabetes:  (Followed by PCP)  How often do you need to have someone help you when you read instructions, pamphlets, or other written materials from your doctor or pharmacy?: 1 - Never    Interpreter Needed?: No      Activities of Daily Living    02/26/2022   12:45 PM  In your present state of health, do you have any difficulty performing the following activities:  Hearing? 0  Vision? 0  Difficulty concentrating or making decisions? 0  Walking or climbing stairs? 0  Dressing or bathing? 0  Doing errands, shopping? 0  Preparing Food and eating ? N  Using the Toilet? N  In the past six months, have you accidently leaked urine? N  Do you have problems with loss of bowel control? N  Managing your Medications? N  Managing your Finances? N  Housekeeping or managing your Housekeeping? N    Patient Care Team: Einar Pheasant, MD as PCP - General (Internal Medicine)  Indicate any recent Medical Services you may have received from other than [facility] providers in the past year (date may be approximate).     Assessment:   This is a routine wellness examination for Donald [name].  Virtual Visit via Telephone Note  I connected with  Donald [name] on 02/26/22 at 12:30 PM EDT by telephone and verified that I am speaking with the correct person using two identifiers.  Persons participating in the virtual visit: patient/Nurse Health Advisor   I discussed the limitations of performing an evaluation and management service by telehealth. We continued and completed visit with audio only. Some vital signs may be absent or patient reported.   Hearing/Vision  screen Hearing Screening - Comments:: Patient is able to hear conversational tones without difficulty. No issues reported. Vision Screening - Comments:: Followed by Dr. Marvel Plan  Wears corrective lenses  Diabetic exam; no retinopathy  They have regular follow up with the ophthalmologist  Dietary issues and exercise activities discussed: Current Exercise Habits: Home exercise routine, Intensity: Mild Regular diet; monitors BS Good water intake   Goals Addressed             This Visit's Progress    DIET - REDUCE SUGAR INTAKE   On track    Low carb diet.  Depression Screen    02/26/2022   12:45 PM 01/10/2022    8:25 AM 07/13/2021    8:41 AM 02/23/2021    9:45 AM 02/23/2020    8:58 AM 05/12/2019   12:20 PM 02/13/2019   11:48 AM  PHQ 2/9 Scores  PHQ - 2 Score 0 0 0 0 0 0 0  PHQ- 9 Score      0     Fall Risk    02/26/2022   12:43 PM 01/10/2022    8:25 AM 07/13/2021    8:41 AM 02/23/2021    9:47 AM 02/23/2020    8:57 AM  Fall Risk   Falls in the past year? 0 0 0 0 0  Number falls in past yr:   0 0 0  Injury with Fall?   0 0   Risk for fall due to :  No Fall Risks     Follow up Falls evaluation completed Falls evaluation completed Falls evaluation completed Falls evaluation completed Falls evaluation completed    [place]: Home free of loose throw rugs in walkways, pet beds, electrical cords, etc? Yes  Adequate lighting in your home to reduce risk of falls? Yes   ASSISTIVE DEVICES UTILIZED TO PREVENT FALLS: Life alert? No  Use of a cane, walker or w/c? No   TIMED UP AND GO: Was the test performed? No .   Cognitive Function:    12/13/2017   10:36 AM 12/12/2016   10:32 AM 12/13/2015    9:51 AM  MMSE - Mini Mental State Exam  Orientation to time '5 5 5  '$ Orientation to Place '5 5 5  '$ Registration '3 3 3  '$ Attention/ Calculation '5 5 5  '$ Recall '1 3 3  '$ Language- name 2 objects '2 2 2  '$ Language- repeat '1 1 1  '$ Language- follow 3 step command  '3 3 3  '$ Language- read & follow direction '1 1 1  '$ Write a sentence '1 1 1  '$ Copy design '1 1 1  '$ Total score '28 30 30        '$ 02/23/2021    9:48 AM 02/23/2020    9:01 AM 02/13/2019   11:50 AM  6CIT Screen  What Year? 0 points 0 points 0 points  What month? 0 points 0 points 0 points  What time? 0 points  0 points  Count back from 20 0 points  0 points  Months in reverse 0 points 0 points 0 points  Repeat phrase 0 points  0 points  Total Score 0 points  0 points    Immunizations Immunization History  Administered Date(s) Administered   Pneumococcal Conjugate-13 06/04/2016   Pneumococcal Polysaccharide-23 12/04/2012   Td 05/14/2011   Zoster, Live 04/04/2012, 06/19/2013   Screening Tests Health Maintenance  Topic Date Due   Zoster Vaccines- Shingrix (1 of 2) 04/12/2022 (Originally 01/29/1965)   OPHTHALMOLOGY EXAM  05/11/2022 (Originally 08/27/2021)   TETANUS/TDAP  07/13/2022 (Originally 06/19/2021)   Pneumonia Vaccine 42+ Years old (3 - PPSV23 if available, else PCV20) 01/11/2023 (Originally 12/04/2017)   INFLUENZA VACCINE  04/10/2022   HEMOGLOBIN A1C  07/13/2022   FOOT EXAM  01/11/2023   URINE MICROALBUMIN  01/11/2023   COLONOSCOPY (Pts 45-52yr Insurance coverage will need to be confirmed)  02/09/2024   Hepatitis C Screening  Completed   HPV VACCINES  Aged Out   COVID-19 Vaccine  Discontinued    Health Maintenance There are no preventive care reminders to display for this  patient.   Lung Cancer Screening: (Low Dose CT Chest recommended if Age 93-80 years, 30 pack-year currently smoking OR have quit w/in 15years.) does not qualify.   Vision Screening: Recommended annual ophthalmology exams for early detection of glaucoma and other disorders of the eye. Eye exam scheduled 05/2022.   Dental Screening: Recommended annual dental exams for proper oral hygiene  Community Resource Referral / Chronic Care Management: CRR required this visit?  No   CCM required this visit?  No       Plan:   Keep all routine maintenance appointments.   I have personally reviewed and noted the following in the patient's chart:   Medical and social history Use of alcohol, tobacco or illicit drugs  Current medications and supplements including opioid prescriptions. Patient is not currently taking opioid prescriptions. Functional ability and status Nutritional status Physical activity Advanced directives List of other physicians Hospitalizations, surgeries, and ER visits in previous 12 months Vitals Screenings to include cognitive, depression, and falls Referrals and appointments  In addition, I have reviewed and discussed with patient certain preventive protocols, quality metrics, and best practice recommendations. A written personalized care plan for preventive services as well as general preventive health recommendations were provided to patient.     Varney Biles, LPN   1/70/0174

## 2022-03-07 ENCOUNTER — Ambulatory Visit: Payer: Medicare HMO | Admitting: Medical

## 2022-03-07 ENCOUNTER — Encounter: Payer: Self-pay | Admitting: Medical

## 2022-03-07 VITALS — BP 110/60 | HR 92 | Ht 70.0 in | Wt 174.6 lb

## 2022-03-07 DIAGNOSIS — I350 Nonrheumatic aortic (valve) stenosis: Secondary | ICD-10-CM | POA: Diagnosis not present

## 2022-03-07 DIAGNOSIS — E782 Mixed hyperlipidemia: Secondary | ICD-10-CM | POA: Diagnosis not present

## 2022-03-07 NOTE — Patient Instructions (Signed)
Medication Instructions:  Your physician recommends that you continue on your current medications as directed. Please refer to the Current Medication list given to you today.  *If you need a refill on your cardiac medications before your next appointment, please call your pharmacy*   Lab Work: None ordered If you have labs (blood work) drawn today and your tests are completely normal, you will receive your results only by: [place] (if you have MyChart) OR A paper copy in the mail If you have any lab test that is abnormal or we need to change your treatment, we will call you to review the results.   Testing/Procedures: Your physician has requested that you have an echocardiogram. Echocardiography is a painless test that uses sound waves to create images of your heart. It provides your doctor with information about the size and shape of your heart and how well your heart's chambers and valves are working. This procedure takes approximately one hour. There are no restrictions for this procedure. (To be scheduled in 6 months)   Follow-Up: At [facility], you and your health needs are our priority.  As part of our continuing mission to provide you with exceptional heart care, we have created designated Provider Care Teams.  These Care Teams include your primary Cardiologist (physician) and Advanced Practice Providers (APPs -  Physician Assistants and Nurse Practitioners) who all work together to provide you with the care you need, when you need it.  We recommend signing up for the patient portal called "MyChart".  Sign up information is provided on this After Visit Summary.  MyChart is used to connect with patients for Virtual Visits (Telemedicine).  Patients are able to view lab/test results, encounter notes, upcoming appointments, etc.  Non-urgent messages can be sent to your provider as well.   To learn more about what you can do with MyChart, go to [email or web address].    Your  next appointment:   6 month(s) (To be scheduled after the echo)  The format for your next appointment:   In Person  Provider:   Kate Sable, MD   Other Instructions N/A  Important Information About Sugar

## 2022-03-07 NOTE — Progress Notes (Signed)
Cardiology Office Note:    Date:  03/07/2022   ID:  Donald [name], DOB 1946-04-11, MRN [id number]  PCP:  Einar Pheasant, MD  [facility] Cardiologist:  None  [facility] Electrophysiologist:  None   Referring MD: Einar Pheasant, MD   Chief Complaint: echo follow-up  History of Present Illness:    Donald [name] is a 76 y.o. male with a hx of HLD, AV stenosis who presents for echo follow-up.   Patient had an echocardiogram 2018 due to cardiac murmur.  EF 55 to 60%, mild stenosis noted in the aortic valve.  Last seen 02/19/22 as a New patient. He was stable from a cardiac perspective. Echo was repeated.   Echo showed LVEF 50-55%, no WMA, G1DD, trivial MR, mod to severe AS with Aortic valve mean gradient 60mHg, peak gradient 336mg, , Vmax 3.06  m/s, DVI = 0.26, Aortic valve area 0.95.  Today, the echo was reviewed in detail. The patient denies any shortness of breath, chest pain, lower leg edema, orthopnea, pnd, lightheadedness, or dizziness. Plan to repeat an echo in 6 months or sooner if patient becomes symptomatic. Heart valve team and potential outcomes were discussed.   Past Medical History:  Diagnosis Date   Carpal tunnel syndrome    [place] ([place]   Nephrolithiasis    H/O stones   [place]oid [place] ([place]    Past Surgical History:  Procedure Laterality Date   APPENDECTOMY  1962   CARPAL TUNNEL RELEASE Bilateral    hands    Current Medications: Current Meds  Medication Sig   ACCU-CHEK AVIVA PLUS test strip TEST TWO TIMES DAILY   ACCU-CHEK SOFTCLIX LANCETS lancets TEST TWO TIMES DAILY   cetirizine (ZYRTEC) 10 MG tablet Take 10 mg by mouth as needed for allergies.   gabapentin (NEURONTIN) 100 MG capsule TAKE 1 CAPSULE AT BEDTIME   leflunomide (ARAVA) 10 MG tablet Take 10 mg by mouth daily.    rosuvastatin (CRESTOR) 5 MG tablet TAKE 1 TABLET EVERY DAY     Allergies:   Patient has no known allergies.   Social History    Socioeconomic History   Marital status: Married    Spouse name: Not on file   Number of children: Not on file   Years of education: Not on file   Highest education level: Not on file  Occupational History   Not on file  Tobacco Use   Smoking status: Former   Smokeless tobacco: Never  Vaping Use   Vaping Use: Never used  Substance and Sexual Activity   Alcohol use: No    Alcohol/week: 0.0 standard drinks of alcohol   Drug use: No   Sexual activity: Not Currently  Other Topics Concern   Not on file  Social History Narrative   Not on file   Social Determinants of Health   Financial Resource Strain: Low Risk  (12/13/2017)   Overall Financial Resource Strain (CARDIA)    Difficulty of Paying Living Expenses: Not hard at all  Food Insecurity: No Food Insecurity (02/26/2022)   Hunger Vital Sign    Worried About Running Out of Food in the Last Year: Never true    [place] the Last Year: Never true  Transportation Needs: No Transportation Needs (02/26/2022)   PRAPARE - TrHydrologistMedical): No    Lack of Transportation (Non-Medical): No  Physical Activity: Insufficiently Active (02/26/2022)   Exercise Vital Sign  Days of Exercise per Week: 4 days    Minutes of Exercise per Session: 20 min  Stress: No Stress Concern Present (02/26/2022)   [place]    Feeling of Stress : Not at all  Social Connections: Unknown (02/23/2021)   Social Connection and Isolation Panel [NHANES]    Frequency of Communication with Friends and Family: Not on file    Frequency of Social Gatherings with Friends and Family: Not on file    Attends Religious Services: Not on file    Active Member of Clubs or Organizations: Not on file    Attends Archivist Meetings: Not on file    Marital Status: Married     Family History: The patient's family history includes Diabetes in his maternal  grandmother; Heart disease in his father; Hypertension in his father.  ROS:   Please see the history of present illness.     All other systems reviewed and are negative.  EKGs/Labs/Other Studies Reviewed:    The following studies were reviewed today:  Echo 02/21/22  1. Left ventricular ejection fraction, by estimation, is 50 to 55%. Left  ventricular ejection fraction by 2D MOD biplane is 50.9 %. The left  ventricle has low normal function. The left ventricle has no regional wall  motion abnormalities. Left  ventricular diastolic parameters are consistent with Grade I diastolic  dysfunction (impaired relaxation).   2. Right ventricular systolic function is normal. The right ventricular  size is normal.   3. The mitral valve is normal in structure. Trivial mitral valve  regurgitation.   4. Aortic valve mean gradient 35mHg, peak gradient 341mg, , Vmax 3.06  m/s, DVI = 0.26, Aortic valve area 0.95.. Marland Kitchenhe aortic valve is calcified.  Aortic valve regurgitation is not visualized. Moderate to severe aortic  valve stenosis.   5. The inferior vena cava is normal in size with greater than 50%  respiratory variability, suggesting right atrial pressure of 3 mmHg.   Comparison(s): Previous AV measurements on 03/01/17 were 1726m PG max and  9mm51mPG mean.   EKG:  EKG is not ordered today.   Recent Labs: 07/13/2021: TSH 1.13 01/10/2022: ALT 16; Hemoglobin 13.4; Platelets 180.0 02/12/2022: BUN 24; Creatinine, Ser 1.16; Potassium 4.1; Sodium 140  Recent Lipid Panel    Component Value Date/Time   CHOL 136 01/10/2022 0915   TRIG 173.0 (H) 01/10/2022 0915   HDL 42.70 01/10/2022 0915   CHOLHDL 3 01/10/2022 0915   VLDL 34.6 01/10/2022 0915   LDLCALC 59 01/10/2022 0915    Physical Exam:    VS:  BP 110/60 (BP Location: Left Arm, Patient Position: Sitting, Cuff Size: Normal)   Pulse 92   Ht '5\' 10"'$  (1.778 m)   Wt 174 lb 9.6 oz (79.2 kg)   SpO2 97%   BMI 25.05 kg/m     Wt Readings from Last  3 Encounters:  03/07/22 174 lb 9.6 oz (79.2 kg)  02/26/22 172 lb (78 kg)  02/19/22 172 lb (78 kg)     GEN:  Well nourished, well developed in no acute distress HEENT: Normal NECK: No JVD; No carotid bruits LYMPHATICS: No lymphadenopathy CARDIAC: RRR, + murmur, no rubs, gallops RESPIRATORY:  Clear to auscultation without rales, wheezing or rhonchi  ABDOMEN: Soft, non-tender, non-distended MUSCULOSKELETAL:  No edema; No deformity  SKIN: Warm and dry NEUROLOGIC:  Alert and oriented x 3 PSYCHIATRIC:  Normal affect   ASSESSMENT:    1. Aortic valve stenosis,  etiology of cardiac valve disease unspecified   2. Hyperlipidemia, mixed    PLAN:    In order of problems listed above:  Moderate to severe Aortic valve stenosis Recent echo showed LVEF 50-55%, no WMA, G1DD, trivial MR, mod to severe AS with Aortic valve mean gradient 30mHg, peak gradient 326mg, , Vmax 3.06 m/s, DVI = 0.26, Aortic valve area 0.95. The patient is completely asymptomatic and is euvolemic on exam. Plan to repeat an echo in 6 months.   HLD LDL 59. Continue Crestor '5mg'$  daily  Disposition: Follow up in 6 month(s) with MD/APP    Signed, Chino Sardo H Ninfa MeekerPA-C  03/07/2022 11:07 AM    [place]

## 2022-03-19 DIAGNOSIS — M0579 Rheumatoid arthritis with rheumatoid factor of multiple sites without organ or systems involvement: Secondary | ICD-10-CM | POA: Diagnosis not present

## 2022-03-19 DIAGNOSIS — Z79899 Other long term (current) drug therapy: Secondary | ICD-10-CM | POA: Diagnosis not present

## 2022-04-09 DIAGNOSIS — D225 Melanocytic nevi of trunk: Secondary | ICD-10-CM | POA: Diagnosis not present

## 2022-04-09 DIAGNOSIS — Z85828 Personal history of other malignant neoplasm of skin: Secondary | ICD-10-CM | POA: Diagnosis not present

## 2022-04-09 DIAGNOSIS — D2261 Melanocytic nevi of right upper limb, including shoulder: Secondary | ICD-10-CM | POA: Diagnosis not present

## 2022-04-09 DIAGNOSIS — L570 Actinic keratosis: Secondary | ICD-10-CM | POA: Diagnosis not present

## 2022-04-09 DIAGNOSIS — D2271 Melanocytic nevi of right lower limb, including hip: Secondary | ICD-10-CM | POA: Diagnosis not present

## 2022-04-09 DIAGNOSIS — X32XXXA Exposure to sunlight, initial encounter: Secondary | ICD-10-CM | POA: Diagnosis not present

## 2022-04-16 DIAGNOSIS — H5213 Myopia, bilateral: Secondary | ICD-10-CM | POA: Diagnosis not present

## 2022-04-16 LAB — HM DIABETES EYE EXAM

## 2022-05-23 ENCOUNTER — Encounter: Payer: Self-pay | Admitting: Internal Medicine

## 2022-07-04 ENCOUNTER — Other Ambulatory Visit: Payer: Self-pay | Admitting: Internal Medicine

## 2022-07-06 DIAGNOSIS — M545 Low back pain, unspecified: Secondary | ICD-10-CM | POA: Diagnosis not present

## 2022-07-06 DIAGNOSIS — M5136 Other intervertebral disc degeneration, lumbar region: Secondary | ICD-10-CM | POA: Diagnosis not present

## 2022-07-06 DIAGNOSIS — R1032 Left lower quadrant pain: Secondary | ICD-10-CM | POA: Diagnosis not present

## 2022-07-06 DIAGNOSIS — R109 Unspecified abdominal pain: Secondary | ICD-10-CM | POA: Diagnosis not present

## 2022-07-06 DIAGNOSIS — M8588 Other specified disorders of bone density and structure, other site: Secondary | ICD-10-CM | POA: Diagnosis not present

## 2022-07-17 ENCOUNTER — Ambulatory Visit
Admission: RE | Admit: 2022-07-17 | Discharge: 2022-07-17 | Disposition: A | Discharge status: Home / Self Care | Payer: Medicare HMO | Source: Ambulatory Visit | Attending: Internal Medicine | Admitting: Internal Medicine

## 2022-07-17 ENCOUNTER — Encounter: Payer: Self-pay | Admitting: Internal Medicine

## 2022-07-17 ENCOUNTER — Ambulatory Visit (INDEPENDENT_AMBULATORY_CARE_PROVIDER_SITE_OTHER): Payer: Medicare HMO | Admitting: Internal Medicine

## 2022-07-17 VITALS — BP 140/92 | HR 85 | Temp 98.0°F | Resp 16 | Ht 70.0 in | Wt 171.0 lb

## 2022-07-17 DIAGNOSIS — M545 Low back pain, unspecified: Secondary | ICD-10-CM

## 2022-07-17 DIAGNOSIS — D649 Anemia, unspecified: Secondary | ICD-10-CM

## 2022-07-17 DIAGNOSIS — E7800 Pure hypercholesterolemia, unspecified: Secondary | ICD-10-CM

## 2022-07-17 DIAGNOSIS — R519 Headache, unspecified: Secondary | ICD-10-CM | POA: Diagnosis not present

## 2022-07-17 DIAGNOSIS — S0990XA Unspecified injury of head, initial encounter: Secondary | ICD-10-CM | POA: Insufficient documentation

## 2022-07-17 DIAGNOSIS — I350 Nonrheumatic aortic (valve) stenosis: Secondary | ICD-10-CM | POA: Diagnosis not present

## 2022-07-17 DIAGNOSIS — E1165 Type 2 diabetes mellitus with hyperglycemia: Secondary | ICD-10-CM

## 2022-07-17 DIAGNOSIS — C61 Malignant neoplasm of prostate: Secondary | ICD-10-CM

## 2022-07-17 DIAGNOSIS — Z0001 Encounter for general adult medical examination with abnormal findings: Secondary | ICD-10-CM | POA: Diagnosis not present

## 2022-07-17 DIAGNOSIS — R4701 Aphasia: Secondary | ICD-10-CM

## 2022-07-17 DIAGNOSIS — M069 Rheumatoid arthritis, unspecified: Secondary | ICD-10-CM | POA: Diagnosis not present

## 2022-07-17 DIAGNOSIS — R030 Elevated blood-pressure reading, without diagnosis of hypertension: Secondary | ICD-10-CM

## 2022-07-17 DIAGNOSIS — Z0000 Encounter for general adult medical examination without abnormal findings: Secondary | ICD-10-CM

## 2022-07-17 LAB — HEPATIC FUNCTION PANEL
ALT: 14 U/L (ref 0–53)
AST: 16 U/L (ref 0–37)
Albumin: 4.3 g/dL (ref 3.5–5.2)
Alkaline Phosphatase: 95 U/L (ref 39–117)
Bilirubin, Direct: 0.1 mg/dL (ref 0.0–0.3)
Total Bilirubin: 0.5 mg/dL (ref 0.2–1.2)
Total Protein: 6.8 g/dL (ref 6.0–8.3)

## 2022-07-17 LAB — CBC WITH DIFFERENTIAL/PLATELET
Basophils Absolute: 0.1 10*3/uL (ref 0.0–0.1)
Basophils Relative: 1 % (ref 0.0–3.0)
Eosinophils Absolute: 0.3 10*3/uL (ref 0.0–0.7)
Eosinophils Relative: 3 % (ref 0.0–5.0)
HCT: 40.3 % (ref 39.0–52.0)
Hemoglobin: 13.4 g/dL (ref 13.0–17.0)
Lymphocytes Relative: 9.2 % — ABNORMAL LOW (ref 12.0–46.0)
Lymphs Abs: 0.9 10*3/uL (ref 0.7–4.0)
MCHC: 33.2 g/dL (ref 30.0–36.0)
MCV: 101.6 fl — ABNORMAL HIGH (ref 78.0–100.0)
Monocytes Absolute: 0.9 10*3/uL (ref 0.1–1.0)
Monocytes Relative: 10.2 % (ref 3.0–12.0)
Neutro Abs: 7.1 10*3/uL (ref 1.4–7.7)
Neutrophils Relative %: 76.6 % (ref 43.0–77.0)
Platelets: 261 10*3/uL (ref 150.0–400.0)
RBC: 3.96 Mil/uL — ABNORMAL LOW (ref 4.22–5.81)
RDW: 13.4 % (ref 11.5–15.5)
WBC: 9.3 10*3/uL (ref 4.0–10.5)

## 2022-07-17 LAB — BASIC METABOLIC PANEL
BUN: 22 mg/dL (ref 6–23)
CO2: 29 mEq/L (ref 19–32)
Calcium: 9.4 mg/dL (ref 8.4–10.5)
Chloride: 103 mEq/L (ref 96–112)
Creatinine, Ser: 1.2 mg/dL (ref 0.40–1.50)
GFR: 59.59 mL/min — ABNORMAL LOW (ref >60.00)
Glucose, Bld: 114 mg/dL — ABNORMAL HIGH (ref 70–99)
Potassium: 4.8 mEq/L (ref 3.5–5.1)
Sodium: 139 mEq/L (ref 135–145)

## 2022-07-17 LAB — VITAMIN B12: Vitamin B-12: >1500 pg/mL — ABNORMAL HIGH (ref 211–911)

## 2022-07-17 LAB — TSH: TSH: 0.92 u[IU]/mL (ref 0.35–5.50)

## 2022-07-17 LAB — LIPID PANEL
Cholesterol: 143 mg/dL (ref 0–200)
HDL: 47.8 mg/dL (ref >39.00)
LDL Cholesterol: 64 mg/dL (ref 0–99)
NonHDL: 94.73
Total CHOL/HDL Ratio: 3
Triglycerides: 156 mg/dL — ABNORMAL HIGH (ref 0.0–149.0)
VLDL: 31.2 mg/dL (ref 0.0–40.0)

## 2022-07-17 LAB — HEMOGLOBIN A1C: Hgb A1c MFr Bld: 6.6 % — ABNORMAL HIGH (ref 4.6–6.5)

## 2022-07-17 LAB — IBC + FERRITIN
Ferritin: 396 ng/mL — ABNORMAL HIGH (ref 22.0–322.0)
Iron: 66 ug/dL (ref 42–165)
Saturation Ratios: 25.2 % (ref 20.0–50.0)
TIBC: 261.8 ug/dL (ref 250.0–450.0)
Transferrin: 187 mg/dL — ABNORMAL LOW (ref 212.0–360.0)

## 2022-07-17 LAB — PSA, MEDICARE: PSA: 1.62 ng/ml (ref 0.10–4.00)

## 2022-07-17 NOTE — Assessment & Plan Note (Signed)
Continue crestor.  Low cholesterol diet and exercise. Follow lipid panel and liver function tests.   

## 2022-07-17 NOTE — Assessment & Plan Note (Signed)
Had the episode where he could not get his words out.  Discussed concern regarding possible TIA - given expressive aphasia.  Lasted for a few minutes and resolved.  Recent head injury.  Will go ahead and get stat CT to confirm no acute abnormality.  Discussed the need for further w/up  as well.  Discussed starting ECASA daily

## 2022-07-17 NOTE — Assessment & Plan Note (Signed)
Had the episode where he could not get his words out.  Discussed concern regarding possible TIA - given expressive aphasia.  Lasted for a few minutes and resolved.  Recent head injury.  Will go ahead and get stat CT to confirm no acute abnormality.  Discussed the need for further w/up  as well.  Discussed starting ECASA daily. Of note, EKG today - SR.  Denies increased heart rate or palpitations.

## 2022-07-17 NOTE — Assessment & Plan Note (Signed)
On leflunomide.  Followed by rheumatology.  Stable.

## 2022-07-17 NOTE — Assessment & Plan Note (Signed)
Check cbc and iron studies.   

## 2022-07-17 NOTE — Assessment & Plan Note (Signed)
ECHO 02/2022 - Echo showed LVEF 50-55%, no WMA, G1DD, trivial MR, mod to severe AS with Aortic valve mean gradient 72mHg, peak gradient 378mg, , Vmax 3.06  m/s, DVI = 0.26, Aortic valve area 0.95.  Recommended f/u ECHO in 6 months.

## 2022-07-17 NOTE — Assessment & Plan Note (Signed)
Low carb diet and exercise.  On no medication.  Follow met b and a1c.   

## 2022-07-17 NOTE — Progress Notes (Addendum)
Patient ID: Donald [name], male   DOB: [birth year], 76 y.o.   MRN: [id number]   Subjective:    Patient ID: Donald [name], male    DOB: [birth year], 76 y.o.   MRN: [id number]    Patient here for  Chief Complaint  Patient presents with   Annual Exam    CPE   .   HPI Here for physical exam.  Recently evaluated acute care - fall.  [place] down a hill.  Back injury.  Left flank pain - treated with prednisone, ultram and skelaxin.  Had xray - question of compression fracture.  Did hit his head when he fell.  No LOC.  No headache.  No dizziness.  Did have an episode four days ago, where he knew what he wanted to say, but could not get his words out.  This lasted for a few minutes and resolved.  No known slurring of speech.  No facial drooping.  Has not had any other episodes since.  No chest pain.  Breathing stable.  Blood pressure has been elevated.  Denies increased heart rate or palpitations. Sees rheumatology - f/u RA.  On [place]. Stable. Seeing cardiology - f/u AS.  Last echo 02/2022.  Recommended f/u in 6 months.  Persistent increased back pain.  No pain radiating down leg.  Pain localized to lower back and buttock.   Past Medical History:  Diagnosis Date   Carpal tunnel syndrome    Diabetes mellitus without complication (HCC)    Nephrolithiasis    H/O stones   Rheumatoid arthritis ([place])    Past Surgical History:  Procedure Laterality Date   APPENDECTOMY  1962   CARPAL TUNNEL RELEASE Bilateral    hands   Family History  Problem Relation Age of Onset   Heart disease Father    Hypertension Father    Diabetes Maternal Grandmother    Social History   Socioeconomic History   Marital status: Married    Spouse name: Not on file   Number of children: Not on file   Years of education: Not on file   Highest education level: Not on file  Occupational History   Not on file  Tobacco Use   Smoking status: Former   Smokeless tobacco: Never  Vaping Use   Vaping Use: Never used  Substance and  Sexual Activity   Alcohol use: No    Alcohol/week: 0.0 standard drinks of alcohol   Drug use: No   Sexual activity: Not Currently  Other Topics Concern   Not on file  Social History Narrative   Not on file   Social Determinants of Health   Financial Resource Strain: Low Risk  (12/13/2017)   Overall Financial Resource Strain (CARDIA)    Difficulty of Paying Living Expenses: Not hard at all  Food Insecurity: No Food Insecurity (02/26/2022)   Hunger Vital Sign    Worried About Running Out of Food in the Last Year: Never true    [place] in the Last Year: Never true  Transportation Needs: No Transportation Needs (02/26/2022)   PRAPARE - Hydrologist (Medical): No    Lack of Transportation (Non-Medical): No  Physical Activity: Insufficiently Active (02/26/2022)   Exercise Vital Sign    Days of Exercise per Week: 4 days    Minutes of Exercise per Session: 20 min  Stress: No Stress Concern Present (02/26/2022)   [place]    Feeling  of Stress : Not at all  Social Connections: Unknown (02/23/2021)   Social Connection and Isolation Panel [NHANES]    Frequency of Communication with Friends and Family: Not on file    Frequency of Social Gatherings with Friends and Family: Not on file    Attends Religious Services: Not on file    Active Member of Clubs or Organizations: Not on file    Attends Archivist Meetings: Not on file    Marital Status: Married     Review of Systems  Constitutional:  Negative for appetite change and unexpected weight change.  HENT:  Negative for congestion, sinus pressure and sore throat.   Eyes:  Negative for pain and visual disturbance.  Respiratory:  Negative for cough, chest tightness and shortness of breath.   Cardiovascular:  Negative for chest pain, palpitations and leg swelling.  Gastrointestinal:  Negative for abdominal pain, diarrhea, nausea and  vomiting.  Genitourinary:  Negative for difficulty urinating and dysuria.  Musculoskeletal:  Positive for back pain. Negative for joint swelling and myalgias.  Skin:  Negative for color change and rash.  Neurological:  Negative for dizziness and headaches.  Hematological:  Negative for adenopathy. Does not bruise/bleed easily.  Psychiatric/Behavioral:  Negative for agitation and dysphoric mood.        Objective:     BP (!) 140/92 (BP Location: Left Arm, Patient Position: Sitting, Cuff Size: Small)   Pulse 85   Temp 98 F (36.7 C) (Temporal)   Resp 16   Ht 5' 10" (1.778 m)   Wt 171 lb (77.6 kg)   SpO2 98%   BMI 24.54 kg/m  Wt Readings from Last 3 Encounters:  07/17/22 171 lb (77.6 kg)  03/07/22 174 lb 9.6 oz (79.2 kg)  02/26/22 172 lb (78 kg)    Physical Exam Constitutional:      General: He is not in acute distress.    Appearance: He is well-developed.  HENT:     Head: Normocephalic and atraumatic.     Nose: Nose normal.     Mouth/Throat:     Pharynx: No oropharyngeal exudate.  Eyes:     General:        Right eye: No discharge.        Left eye: No discharge.     Conjunctiva/sclera: Conjunctivae normal.  Neck:     Thyroid: No thyromegaly.  Cardiovascular:     Rate and Rhythm: Normal rate and regular rhythm.  Pulmonary:     Effort: No respiratory distress.     Breath sounds: Normal breath sounds. No wheezing.  Abdominal:     General: Bowel sounds are normal.     Palpations: Abdomen is soft.     Tenderness: There is no abdominal tenderness.  Genitourinary:    Penis: Normal.      Rectum: Normal.  Musculoskeletal:        General: No tenderness.     Cervical back: Neck supple.  Lymphadenopathy:     Cervical: No cervical adenopathy.  Skin:    General: Skin is dry.     Findings: No rash.  Neurological:     Mental Status: He is alert and oriented to person, place, and time.  Psychiatric:        Behavior: Behavior normal.      Outpatient Encounter  Medications as of 07/17/2022  Medication Sig   ACCU-CHEK AVIVA PLUS test strip TEST TWO TIMES DAILY   ACCU-CHEK SOFTCLIX LANCETS lancets TEST TWO TIMES DAILY  cetirizine (ZYRTEC) 10 MG tablet Take 10 mg by mouth as needed for allergies.   gabapentin (NEURONTIN) 100 MG capsule TAKE 1 CAPSULE AT BEDTIME   leflunomide (ARAVA) 10 MG tablet Take 10 mg by mouth daily.    metaxalone (SKELAXIN) 800 MG tablet 1/2 - 1 tablet bid prn   rosuvastatin (CRESTOR) 5 MG tablet TAKE 1 TABLET EVERY DAY   No facility-administered encounter medications on file as of 07/17/2022.     Lab Results  Component Value Date   WBC 9.3 07/17/2022   HGB 13.4 07/17/2022   HCT 40.3 07/17/2022   PLT 261.0 07/17/2022   GLUCOSE 114 (H) 07/17/2022   CHOL 143 07/17/2022   TRIG 156.0 (H) 07/17/2022   HDL 47.80 07/17/2022   LDLCALC 64 07/17/2022   ALT 14 07/17/2022   AST 16 07/17/2022   NA 139 07/17/2022   K 4.8 07/17/2022   CL 103 07/17/2022   CREATININE 1.20 07/17/2022   BUN 22 07/17/2022   CO2 29 07/17/2022   TSH 0.92 07/17/2022   PSA 1.62 07/17/2022   HGBA1C 6.6 (H) 07/17/2022   MICROALBUR 4.1 (H) 01/10/2022       Assessment & Plan:   Problem List Items Addressed This Visit     Anemia    Check cbc and iron studies.        Relevant Orders   CBC with Differential/Platelet (Completed)   IBC + Ferritin (Completed)   Vitamin B12 (Completed)   Aortic stenosis    ECHO 02/2022 - Echo showed LVEF 50-55%, no WMA, G1DD, trivial MR, mod to severe AS with Aortic valve mean gradient 58mHg, peak gradient 329mg, , Vmax 3.06  m/s, DVI = 0.26, Aortic valve area 0.95.  Recommended f/u ECHO in 6 months.       Relevant Orders   EKG 12-Lead (Completed)   Back pain    Back pain - lower back into buttocks.  No pain radiating down leg.  No numbness/tingling.  Took prednisone.  Also was taking muscle relaxer and tramadol.  Pain is overall better, but still with increased back pain.  No increased pain with ambulation.   Obtain xray results from acute care.  Continue tylenol.  Discussed continue muscle relaxer.       Relevant Medications   metaxalone (SKELAXIN) 800 MG tablet   Diabetes (HCC)    Low carb diet and exercise.  On no medication.  Follow met b and a1c.        Relevant Orders   Hemoglobin A1c (Completed)   Basic metabolic panel (Completed)   Elevated blood pressure reading    Elevated blood pressure today.  Feel related to increased pain.  Hold on medication.  Have him spot check pressure.  Send in readings.  Get him back in soon to reassess.       Expressive aphasia    Had the episode where he could not get his words out.  Discussed concern regarding possible TIA - given expressive aphasia.  Lasted for a few minutes and resolved.  Recent head injury.  Will go ahead and get stat CT to confirm no acute abnormality.  Discussed the need for further w/up  as well.  Discussed starting ECASA daily. Of note, EKG today - SR.  Denies increased heart rate or palpitations.       Relevant Orders   CT HEAD WO CONTRAST (5MM) (Completed)   Head trauma    Had the episode where he could not get his words out.  Discussed concern regarding possible TIA - given expressive aphasia.  Lasted for a few minutes and resolved.  Recent head injury.  Will go ahead and get stat CT to confirm no acute abnormality.  Discussed the need for further w/up  as well.  Discussed starting ECASA daily       Relevant Orders   CT HEAD WO CONTRAST (5MM) (Completed)   Health care maintenance    Physical today 07/17/22.  Colonoscopy 02/2014.  Due f/u 10 years.  Check psa today.       Hypercholesterolemia    Continue crestor.  Low cholesterol diet and exercise.  Follow lipid panel and liver function tests.       Relevant Orders   Lipid panel (Completed)   TSH (Completed)   Hepatic function panel (Completed)   Rheumatoid arthritis ([place])    On leflunomide.  Followed by rheumatology.  Stable.       Relevant Medications    metaxalone (SKELAXIN) 800 MG tablet   Other Visit Diagnoses     Routine general medical examination at a health care facility    -  Primary   Prostate cancer [facility])       Relevant Orders   PSA, Medicare (Completed)        Einar Pheasant, MD

## 2022-07-17 NOTE — Assessment & Plan Note (Signed)
Physical today 07/17/22.  Colonoscopy 02/2014.  Due f/u 10 years.  Check psa today.

## 2022-07-18 ENCOUNTER — Encounter: Payer: Self-pay | Admitting: Internal Medicine

## 2022-07-18 DIAGNOSIS — M549 Dorsalgia, unspecified: Secondary | ICD-10-CM | POA: Insufficient documentation

## 2022-07-18 DIAGNOSIS — M545 Low back pain, unspecified: Secondary | ICD-10-CM | POA: Insufficient documentation

## 2022-07-18 DIAGNOSIS — R030 Elevated blood-pressure reading, without diagnosis of hypertension: Secondary | ICD-10-CM | POA: Insufficient documentation

## 2022-07-18 NOTE — Assessment & Plan Note (Signed)
Elevated blood pressure today.  Feel related to increased pain.  Hold on medication.  Have him spot check pressure.  Send in readings.  Get him back in soon to reassess.

## 2022-07-18 NOTE — Assessment & Plan Note (Signed)
Back pain - lower back into buttocks.  No pain radiating down leg.  No numbness/tingling.  Took prednisone.  Also was taking muscle relaxer and tramadol.  Pain is overall better, but still with increased back pain.  No increased pain with ambulation.  Obtain xray results from acute care.  Continue tylenol.  Discussed continue muscle relaxer.

## 2022-07-20 MED ORDER — METAXALONE 800 MG PO TABS: ORAL_TABLET | ORAL | 0 refills | Status: DC

## 2022-07-20 NOTE — Addendum Note (Signed)
Addended by: Alisa Graff on: 07/20/2022 05:39 PM   Modules accepted: Orders

## 2022-07-25 ENCOUNTER — Telehealth: Payer: Self-pay | Admitting: Internal Medicine

## 2022-07-25 DIAGNOSIS — R4701 Aphasia: Secondary | ICD-10-CM

## 2022-07-25 NOTE — Telephone Encounter (Signed)
Please call and notify Donald [name] that I have placed the order for MRI brain.  Also, let him know that I would like to place referral for neurology referral.  I spoke to Dr Manuella Ghazi about him and his recent episode with his speech.  If agreeable, I will place order for referral to neurology [name]) - Dr Manuella Ghazi.

## 2022-07-26 ENCOUNTER — Telehealth: Payer: Self-pay | Admitting: Internal Medicine

## 2022-07-26 DIAGNOSIS — M545 Low back pain, unspecified: Secondary | ICD-10-CM | POA: Diagnosis not present

## 2022-07-26 DIAGNOSIS — M4856XD Collapsed vertebra, not elsewhere classified, lumbar region, subsequent encounter for fracture with routine healing: Secondary | ICD-10-CM | POA: Diagnosis not present

## 2022-07-26 NOTE — Telephone Encounter (Signed)
Attempted to call pt - home # no ans, no vm Cell # lm for pt to cb

## 2022-07-26 NOTE — Telephone Encounter (Signed)
Lft pt vm to call ofc to sch MRI. thanks 

## 2022-07-26 NOTE — Telephone Encounter (Signed)
Patient is returning our call.  I transferred call to [facility].

## 2022-07-28 ENCOUNTER — Other Ambulatory Visit: Payer: Self-pay | Admitting: Internal Medicine

## 2022-07-28 DIAGNOSIS — R4701 Aphasia: Secondary | ICD-10-CM

## 2022-07-28 NOTE — Progress Notes (Signed)
Order placed for neurology referral.   

## 2022-08-08 ENCOUNTER — Ambulatory Visit
Admission: RE | Admit: 2022-08-08 | Discharge: 2022-08-08 | Disposition: A | Discharge status: Home / Self Care | Payer: Medicare HMO | Source: Ambulatory Visit | Attending: Internal Medicine | Admitting: Internal Medicine

## 2022-08-08 DIAGNOSIS — R4701 Aphasia: Secondary | ICD-10-CM | POA: Diagnosis not present

## 2022-08-08 DIAGNOSIS — G459 Transient cerebral ischemic attack, unspecified: Secondary | ICD-10-CM | POA: Diagnosis not present

## 2022-08-20 DIAGNOSIS — F02A0 Dementia in other diseases classified elsewhere, mild, without behavioral disturbance, psychotic disturbance, mood disturbance, and anxiety: Secondary | ICD-10-CM | POA: Diagnosis not present

## 2022-08-20 DIAGNOSIS — R0989 Other specified symptoms and signs involving the circulatory and respiratory systems: Secondary | ICD-10-CM | POA: Diagnosis not present

## 2022-08-20 DIAGNOSIS — G459 Transient cerebral ischemic attack, unspecified: Secondary | ICD-10-CM | POA: Diagnosis not present

## 2022-08-20 DIAGNOSIS — Z118 Encounter for screening for other infectious and parasitic diseases: Secondary | ICD-10-CM | POA: Diagnosis not present

## 2022-08-20 DIAGNOSIS — G3183 Dementia with Lewy bodies: Secondary | ICD-10-CM | POA: Diagnosis not present

## 2022-08-20 DIAGNOSIS — G4752 REM sleep behavior disorder: Secondary | ICD-10-CM | POA: Diagnosis not present

## 2022-08-21 ENCOUNTER — Other Ambulatory Visit: Payer: Self-pay

## 2022-08-21 DIAGNOSIS — F02A0 Dementia in other diseases classified elsewhere, mild, without behavioral disturbance, psychotic disturbance, mood disturbance, and anxiety: Secondary | ICD-10-CM

## 2022-08-21 DIAGNOSIS — R0989 Other specified symptoms and signs involving the circulatory and respiratory systems: Secondary | ICD-10-CM

## 2022-08-23 ENCOUNTER — Ambulatory Visit
Admission: RE | Admit: 2022-08-23 | Discharge: 2022-08-23 | Disposition: A | Discharge status: Home / Self Care | Payer: Medicare HMO | Source: Ambulatory Visit | Attending: Neurology | Admitting: Neurology

## 2022-08-23 ENCOUNTER — Encounter: Payer: Self-pay | Admitting: Neurology

## 2022-08-23 DIAGNOSIS — G3183 Dementia with Lewy bodies: Secondary | ICD-10-CM | POA: Diagnosis not present

## 2022-08-23 DIAGNOSIS — I6523 Occlusion and stenosis of bilateral carotid arteries: Secondary | ICD-10-CM | POA: Diagnosis not present

## 2022-08-23 DIAGNOSIS — F02A0 Dementia in other diseases classified elsewhere, mild, without behavioral disturbance, psychotic disturbance, mood disturbance, and anxiety: Secondary | ICD-10-CM | POA: Insufficient documentation

## 2022-08-23 DIAGNOSIS — R0989 Other specified symptoms and signs involving the circulatory and respiratory systems: Secondary | ICD-10-CM | POA: Insufficient documentation

## 2022-08-25 ENCOUNTER — Encounter: Payer: Self-pay | Admitting: Internal Medicine

## 2022-08-25 DIAGNOSIS — R6889 Other general symptoms and signs: Secondary | ICD-10-CM | POA: Insufficient documentation

## 2022-08-25 DIAGNOSIS — G459 Transient cerebral ischemic attack, unspecified: Secondary | ICD-10-CM | POA: Insufficient documentation

## 2022-09-06 ENCOUNTER — Ambulatory Visit: Payer: Medicare HMO | Attending: Medical

## 2022-09-07 ENCOUNTER — Ambulatory Visit: Payer: Medicare HMO | Admitting: Cardiology

## 2022-09-13 ENCOUNTER — Ambulatory Visit: Payer: Medicare HMO | Admitting: Cardiology

## 2022-09-17 ENCOUNTER — Ambulatory Visit: Payer: Medicare HMO | Attending: Medical

## 2022-09-17 DIAGNOSIS — I350 Nonrheumatic aortic (valve) stenosis: Secondary | ICD-10-CM

## 2022-09-17 LAB — ECHOCARDIOGRAM COMPLETE
AR max vel: 0.89 cm2
AV Area VTI: 0.78 cm2
AV Area mean vel: 0.79 cm2
AV Mean grad: 28 mmHg
AV Peak grad: 46.2 mmHg
Ao pk vel: 3.4 m/s
Area-P 1/2: 1.9 cm2
S' Lateral: 2.8 cm

## 2022-09-24 ENCOUNTER — Telehealth: Payer: Self-pay | Admitting: Medical

## 2022-09-24 NOTE — Telephone Encounter (Signed)
  Pt said he missed a call regarding his echo result.

## 2022-09-25 NOTE — Telephone Encounter (Signed)
Attempted to contact pt. Unable to leave VM as mailbox is full   Cadence Ninfa Meeker, PA-C 09/21/2022  4:12 PM EST     Echo showed normal pump function with impaired relaxation, mildly leaky mitral valve, moderate to severe Aortic stenosis, minimal worsening of the aortic valve.

## 2022-09-28 ENCOUNTER — Ambulatory Visit: Payer: Medicare HMO | Attending: Cardiology | Admitting: Cardiology

## 2022-09-28 ENCOUNTER — Encounter: Payer: Self-pay | Admitting: Cardiology

## 2022-09-28 VITALS — BP 134/82 | HR 78 | Ht 70.0 in | Wt 173.0 lb

## 2022-09-28 DIAGNOSIS — E782 Mixed hyperlipidemia: Secondary | ICD-10-CM

## 2022-09-28 DIAGNOSIS — I350 Nonrheumatic aortic (valve) stenosis: Secondary | ICD-10-CM | POA: Diagnosis not present

## 2022-09-28 NOTE — Progress Notes (Signed)
Cardiology Office Note:    Date:  09/28/2022   ID:  Donald [name], DOB 1946-06-18, MRN [id number]  PCP:  Donald Pheasant, MD   [facility] Providers Cardiologist:  None     Referring MD: Donald Pheasant, MD   Chief Complaint  Patient presents with   Follow-up    6 month f/u, no new cardiac concerns     History of Present Illness:    Donald [name] is a 77 y.o. male with a hx of hyperlipidemia, moderate to severe aortic valve stenosis who presents due to aortic valve stenosis.  Being seen for aortic valve disease monitoring with serial echoes.  Recent echo 09/17/2022 showed moderate to severe aortic valve stenosis.  He feels well, denies chest pain, shortness of breath, edema, dizziness, presyncope or syncope.  He recently lost his footing going up an incline at his home, he fell injuring his back, states back pain is improving.  Otherwise has no new cardiac concerns at this time.  Prior cardiac testing Echo 09/2022 EF 55 to 60%, moderate to severe AS, mean gradient 28, DVI 0.25 Echo 02/2022 EF 50 to 55%, moderate to severe aortic stenosis mean gradient 26, DVI 0.26 Echo 2018 EF 55 to 60%, mild stenosis noted in the aortic valve.   Past Medical History:  Diagnosis Date   Carpal tunnel syndrome    Diabetes mellitus without complication ([place])    Nephrolithiasis    H/O stones   Rheumatoid arthritis ([place])     Past Surgical History:  Procedure Laterality Date   APPENDECTOMY  1962   CARPAL TUNNEL RELEASE Bilateral    hands    Current Medications: Current Meds  Medication Sig   ACCU-CHEK AVIVA PLUS test strip TEST TWO TIMES DAILY   ACCU-CHEK SOFTCLIX LANCETS lancets TEST TWO TIMES DAILY   cetirizine (ZYRTEC) 10 MG tablet Take 10 mg by mouth as needed for allergies.   gabapentin (NEURONTIN) 100 MG capsule TAKE 1 CAPSULE AT BEDTIME   leflunomide (ARAVA) 10 MG tablet Take 10 mg by mouth daily.    Multiple Vitamin (MULTI-VITAMIN) tablet Take 1 tablet by mouth daily.    rivastigmine (EXELON) 1.5 MG capsule Take 1.5 mg by mouth 2 (two) times daily.   rosuvastatin (CRESTOR) 5 MG tablet TAKE 1 TABLET EVERY DAY     Allergies:   Patient has no known allergies.   Social History   Socioeconomic History   Marital status: Married    Spouse name: Not on file   Number of children: Not on file   Years of education: Not on file   Highest education level: Not on file  Occupational History   Not on file  Tobacco Use   Smoking status: Former   Smokeless tobacco: Never  Vaping Use   Vaping Use: Never used  Substance and Sexual Activity   Alcohol use: No    Alcohol/week: 0.0 standard drinks of alcohol   Drug use: No   Sexual activity: Not Currently  Other Topics Concern   Not on file  Social History Narrative   Not on file   Social Determinants of Health   Financial Resource Strain: Low Risk  (12/13/2017)   Overall Financial Resource Strain (CARDIA)    Difficulty of Paying Living Expenses: Not hard at all  Food Insecurity: No Food Insecurity (02/26/2022)   Hunger Vital Sign    Worried About Running Out of Food in the Last Year: Never true    [place] in the Last  Year: Never true  Transportation Needs: No Transportation Needs (02/26/2022)   PRAPARE - Hydrologist (Medical): No    Lack of Transportation (Non-Medical): No  Physical Activity: Insufficiently Active (02/26/2022)   Exercise Vital Sign    Days of Exercise per Week: 4 days    Minutes of Exercise per Session: 20 min  Stress: No Stress Concern Present (02/26/2022)   [place]    Feeling of Stress : Not at all  Social Connections: Unknown (02/23/2021)   Social Connection and Isolation Panel [NHANES]    Frequency of Communication with Friends and Family: Not on file    Frequency of Social Gatherings with Friends and Family: Not on file    Attends Religious Services: Not on file    Active Member of  Clubs or Organizations: Not on file    Attends Archivist Meetings: Not on file    Marital Status: Married     Family History: The patient's family history includes Diabetes in his maternal grandmother; Heart disease in his father; Hypertension in his father.  ROS:   Please see the history of present illness.     All other systems reviewed and are negative.  EKGs/Labs/Other Studies Reviewed:    The following studies were reviewed today:   EKG:  EKG is  ordered today.  The ekg ordered today demonstrates normal sinus rhythm, normal ECG.  Recent Labs: 07/17/2022: ALT 14; BUN 22; Creatinine, Ser 1.20; Hemoglobin 13.4; Platelets 261.0; Potassium 4.8; Sodium 139; TSH 0.92  Recent Lipid Panel    Component Value Date/Time   CHOL 143 07/17/2022 0926   TRIG 156.0 (H) 07/17/2022 0926   HDL 47.80 07/17/2022 0926   CHOLHDL 3 07/17/2022 0926   VLDL 31.2 07/17/2022 0926   LDLCALC 64 07/17/2022 0926     Risk Assessment/Calculations:         Physical Exam:    VS:  BP 134/82 (BP Location: Left Arm, Patient Position: Sitting, Cuff Size: Normal)   Pulse 78   Ht '5\' 10"'$  (1.778 m)   Wt 173 lb (78.5 kg)   SpO2 99%   BMI 24.82 kg/m     Wt Readings from Last 3 Encounters:  09/28/22 173 lb (78.5 kg)  07/17/22 171 lb (77.6 kg)  03/07/22 174 lb 9.6 oz (79.2 kg)     GEN:  Well nourished, well developed in no acute distress HEENT: Normal NECK: No JVD; No carotid bruits CARDIAC: RRR, 2/6 systolic murmur RESPIRATORY:  Clear to auscultation without rales, wheezing or rhonchi  ABDOMEN: Soft, non-tender, non-distended MUSCULOSKELETAL:  No edema; No deformity  SKIN: Warm and dry NEUROLOGIC:  Alert and oriented x 3 PSYCHIATRIC:  Normal affect   ASSESSMENT:    1. Aortic valve stenosis, etiology of cardiac valve disease unspecified   2. Mixed hyperlipidemia    PLAN:    In order of problems listed above:  Moderate aortic valve stenosis, peak gradient 46, mean gradient 28, DVI  0.25.  AVA 0.78 cm.  Aortic valve disease about the same as prior.  Plan to repeat echo in 6 months for continued monitoring. Hyperlipidemia, continue Crestor.  Follow-up after repeat echo.      Medication Adjustments/Labs and Tests Ordered: Current medicines are reviewed at length with the patient today.  Concerns regarding medicines are outlined above.  Orders Placed This Encounter  Procedures   EKG 12-Lead   No orders of the defined types were placed in  this encounter.   There are no Patient Instructions on file for this visit.   Signed, Kate Sable, MD  09/28/2022 2:56 PM    [place]

## 2022-09-28 NOTE — Patient Instructions (Signed)
Medication Instructions:   Your physician recommends that you continue on your current medications as directed. Please refer to the Current Medication list given to you today.  *If you need a refill on your cardiac medications before your next appointment, please call your pharmacy*   Lab Work:  None Ordered  If you have labs (blood work) drawn today and your tests are completely normal, you will receive your results only by: [place] (if you have MyChart) OR A paper copy in the mail If you have any lab test that is abnormal or we need to change your treatment, we will call you to review the results.   Testing/Procedures:  Echocardiogram - in 6 months   Your physician has requested that you have an echocardiogram. Echocardiography is a painless test that uses sound waves to create images of your heart. It provides your doctor with information about the size and shape of your heart and how well your heart's chambers and valves are working. This procedure takes approximately one hour. There are no restrictions for this procedure. Please note; depending on visual quality an IV may need to be placed.     Follow-Up: At [facility], you and your health needs are our priority.  As part of our continuing mission to provide you with exceptional heart care, we have created designated Provider Care Teams.  These Care Teams include your primary Cardiologist (physician) and Advanced Practice Providers (APPs -  Physician Assistants and Nurse Practitioners) who all work together to provide you with the care you need, when you need it.  We recommend signing up for the patient portal called "MyChart".  Sign up information is provided on this After Visit Summary.  MyChart is used to connect with patients for Virtual Visits (Telemedicine).  Patients are able to view lab/test results, encounter notes, upcoming appointments, etc.  Non-urgent messages can be sent to your provider as well.    To learn more about what you can do with MyChart, go to [email or web address].    Your next appointment:    After ECHO Provider:   You may see Kate Sable, MD or one of the following Advanced Practice Providers on your designated Care Team:   Murray Hodgkins, NP Christell Faith, PA-C Cadence Kathlen Mody, PA-C Gerrie Nordmann, NP

## 2022-10-15 DIAGNOSIS — D2261 Melanocytic nevi of right upper limb, including shoulder: Secondary | ICD-10-CM | POA: Diagnosis not present

## 2022-10-15 DIAGNOSIS — D2262 Melanocytic nevi of left upper limb, including shoulder: Secondary | ICD-10-CM | POA: Diagnosis not present

## 2022-10-15 DIAGNOSIS — L570 Actinic keratosis: Secondary | ICD-10-CM | POA: Diagnosis not present

## 2022-10-15 DIAGNOSIS — D2272 Melanocytic nevi of left lower limb, including hip: Secondary | ICD-10-CM | POA: Diagnosis not present

## 2022-10-15 DIAGNOSIS — X32XXXA Exposure to sunlight, initial encounter: Secondary | ICD-10-CM | POA: Diagnosis not present

## 2022-10-15 DIAGNOSIS — Z85828 Personal history of other malignant neoplasm of skin: Secondary | ICD-10-CM | POA: Diagnosis not present

## 2022-10-18 DIAGNOSIS — E1021 Type 1 diabetes mellitus with diabetic nephropathy: Secondary | ICD-10-CM | POA: Diagnosis not present

## 2022-10-18 LAB — HM DIABETES EYE EXAM

## 2022-11-15 DIAGNOSIS — M9901 Segmental and somatic dysfunction of cervical region: Secondary | ICD-10-CM | POA: Diagnosis not present

## 2022-11-15 DIAGNOSIS — M9904 Segmental and somatic dysfunction of sacral region: Secondary | ICD-10-CM | POA: Diagnosis not present

## 2022-11-15 DIAGNOSIS — M9905 Segmental and somatic dysfunction of pelvic region: Secondary | ICD-10-CM | POA: Diagnosis not present

## 2022-11-15 DIAGNOSIS — M9903 Segmental and somatic dysfunction of lumbar region: Secondary | ICD-10-CM | POA: Diagnosis not present

## 2022-11-16 DIAGNOSIS — M9904 Segmental and somatic dysfunction of sacral region: Secondary | ICD-10-CM | POA: Diagnosis not present

## 2022-11-16 DIAGNOSIS — M9901 Segmental and somatic dysfunction of cervical region: Secondary | ICD-10-CM | POA: Diagnosis not present

## 2022-11-16 DIAGNOSIS — M9903 Segmental and somatic dysfunction of lumbar region: Secondary | ICD-10-CM | POA: Diagnosis not present

## 2022-11-16 DIAGNOSIS — M9905 Segmental and somatic dysfunction of pelvic region: Secondary | ICD-10-CM | POA: Diagnosis not present

## 2022-11-20 DIAGNOSIS — M9901 Segmental and somatic dysfunction of cervical region: Secondary | ICD-10-CM | POA: Diagnosis not present

## 2022-11-20 DIAGNOSIS — M9903 Segmental and somatic dysfunction of lumbar region: Secondary | ICD-10-CM | POA: Diagnosis not present

## 2022-11-20 DIAGNOSIS — M9904 Segmental and somatic dysfunction of sacral region: Secondary | ICD-10-CM | POA: Diagnosis not present

## 2022-11-20 DIAGNOSIS — G459 Transient cerebral ischemic attack, unspecified: Secondary | ICD-10-CM | POA: Diagnosis not present

## 2022-11-20 DIAGNOSIS — G3183 Dementia with Lewy bodies: Secondary | ICD-10-CM | POA: Diagnosis not present

## 2022-11-20 DIAGNOSIS — F02A0 Dementia in other diseases classified elsewhere, mild, without behavioral disturbance, psychotic disturbance, mood disturbance, and anxiety: Secondary | ICD-10-CM | POA: Diagnosis not present

## 2022-11-20 DIAGNOSIS — M9905 Segmental and somatic dysfunction of pelvic region: Secondary | ICD-10-CM | POA: Diagnosis not present

## 2022-11-20 DIAGNOSIS — R0989 Other specified symptoms and signs involving the circulatory and respiratory systems: Secondary | ICD-10-CM | POA: Diagnosis not present

## 2022-11-20 DIAGNOSIS — G4752 REM sleep behavior disorder: Secondary | ICD-10-CM | POA: Diagnosis not present

## 2022-11-22 DIAGNOSIS — M9903 Segmental and somatic dysfunction of lumbar region: Secondary | ICD-10-CM | POA: Diagnosis not present

## 2022-11-22 DIAGNOSIS — M9905 Segmental and somatic dysfunction of pelvic region: Secondary | ICD-10-CM | POA: Diagnosis not present

## 2022-11-22 DIAGNOSIS — M9904 Segmental and somatic dysfunction of sacral region: Secondary | ICD-10-CM | POA: Diagnosis not present

## 2022-11-22 DIAGNOSIS — M9901 Segmental and somatic dysfunction of cervical region: Secondary | ICD-10-CM | POA: Diagnosis not present

## 2022-11-27 DIAGNOSIS — Z79899 Other long term (current) drug therapy: Secondary | ICD-10-CM | POA: Diagnosis not present

## 2022-11-27 DIAGNOSIS — M9901 Segmental and somatic dysfunction of cervical region: Secondary | ICD-10-CM | POA: Diagnosis not present

## 2022-11-27 DIAGNOSIS — M9904 Segmental and somatic dysfunction of sacral region: Secondary | ICD-10-CM | POA: Diagnosis not present

## 2022-11-27 DIAGNOSIS — M9903 Segmental and somatic dysfunction of lumbar region: Secondary | ICD-10-CM | POA: Diagnosis not present

## 2022-11-27 DIAGNOSIS — M4856XD Collapsed vertebra, not elsewhere classified, lumbar region, subsequent encounter for fracture with routine healing: Secondary | ICD-10-CM | POA: Diagnosis not present

## 2022-11-27 DIAGNOSIS — M0579 Rheumatoid arthritis with rheumatoid factor of multiple sites without organ or systems involvement: Secondary | ICD-10-CM | POA: Diagnosis not present

## 2022-11-27 DIAGNOSIS — M9905 Segmental and somatic dysfunction of pelvic region: Secondary | ICD-10-CM | POA: Diagnosis not present

## 2022-11-29 DIAGNOSIS — M9905 Segmental and somatic dysfunction of pelvic region: Secondary | ICD-10-CM | POA: Diagnosis not present

## 2022-11-29 DIAGNOSIS — M9903 Segmental and somatic dysfunction of lumbar region: Secondary | ICD-10-CM | POA: Diagnosis not present

## 2022-11-29 DIAGNOSIS — M9904 Segmental and somatic dysfunction of sacral region: Secondary | ICD-10-CM | POA: Diagnosis not present

## 2022-11-29 DIAGNOSIS — M9901 Segmental and somatic dysfunction of cervical region: Secondary | ICD-10-CM | POA: Diagnosis not present

## 2022-12-04 DIAGNOSIS — M9901 Segmental and somatic dysfunction of cervical region: Secondary | ICD-10-CM | POA: Diagnosis not present

## 2022-12-04 DIAGNOSIS — M9904 Segmental and somatic dysfunction of sacral region: Secondary | ICD-10-CM | POA: Diagnosis not present

## 2022-12-04 DIAGNOSIS — M9905 Segmental and somatic dysfunction of pelvic region: Secondary | ICD-10-CM | POA: Diagnosis not present

## 2022-12-04 DIAGNOSIS — M9903 Segmental and somatic dysfunction of lumbar region: Secondary | ICD-10-CM | POA: Diagnosis not present

## 2022-12-06 DIAGNOSIS — M9905 Segmental and somatic dysfunction of pelvic region: Secondary | ICD-10-CM | POA: Diagnosis not present

## 2022-12-06 DIAGNOSIS — M9903 Segmental and somatic dysfunction of lumbar region: Secondary | ICD-10-CM | POA: Diagnosis not present

## 2022-12-06 DIAGNOSIS — M9904 Segmental and somatic dysfunction of sacral region: Secondary | ICD-10-CM | POA: Diagnosis not present

## 2022-12-06 DIAGNOSIS — M9901 Segmental and somatic dysfunction of cervical region: Secondary | ICD-10-CM | POA: Diagnosis not present

## 2022-12-11 DIAGNOSIS — M9905 Segmental and somatic dysfunction of pelvic region: Secondary | ICD-10-CM | POA: Diagnosis not present

## 2022-12-11 DIAGNOSIS — M9903 Segmental and somatic dysfunction of lumbar region: Secondary | ICD-10-CM | POA: Diagnosis not present

## 2022-12-11 DIAGNOSIS — M9904 Segmental and somatic dysfunction of sacral region: Secondary | ICD-10-CM | POA: Diagnosis not present

## 2022-12-11 DIAGNOSIS — M9901 Segmental and somatic dysfunction of cervical region: Secondary | ICD-10-CM | POA: Diagnosis not present

## 2022-12-13 DIAGNOSIS — M9901 Segmental and somatic dysfunction of cervical region: Secondary | ICD-10-CM | POA: Diagnosis not present

## 2022-12-13 DIAGNOSIS — M9904 Segmental and somatic dysfunction of sacral region: Secondary | ICD-10-CM | POA: Diagnosis not present

## 2022-12-13 DIAGNOSIS — M9905 Segmental and somatic dysfunction of pelvic region: Secondary | ICD-10-CM | POA: Diagnosis not present

## 2022-12-13 DIAGNOSIS — M9903 Segmental and somatic dysfunction of lumbar region: Secondary | ICD-10-CM | POA: Diagnosis not present

## 2022-12-18 DIAGNOSIS — M9905 Segmental and somatic dysfunction of pelvic region: Secondary | ICD-10-CM | POA: Diagnosis not present

## 2022-12-18 DIAGNOSIS — M9901 Segmental and somatic dysfunction of cervical region: Secondary | ICD-10-CM | POA: Diagnosis not present

## 2022-12-18 DIAGNOSIS — M9904 Segmental and somatic dysfunction of sacral region: Secondary | ICD-10-CM | POA: Diagnosis not present

## 2022-12-18 DIAGNOSIS — M9903 Segmental and somatic dysfunction of lumbar region: Secondary | ICD-10-CM | POA: Diagnosis not present

## 2022-12-20 DIAGNOSIS — M9905 Segmental and somatic dysfunction of pelvic region: Secondary | ICD-10-CM | POA: Diagnosis not present

## 2022-12-20 DIAGNOSIS — M9904 Segmental and somatic dysfunction of sacral region: Secondary | ICD-10-CM | POA: Diagnosis not present

## 2022-12-20 DIAGNOSIS — M9903 Segmental and somatic dysfunction of lumbar region: Secondary | ICD-10-CM | POA: Diagnosis not present

## 2022-12-20 DIAGNOSIS — M9901 Segmental and somatic dysfunction of cervical region: Secondary | ICD-10-CM | POA: Diagnosis not present

## 2022-12-25 DIAGNOSIS — M9903 Segmental and somatic dysfunction of lumbar region: Secondary | ICD-10-CM | POA: Diagnosis not present

## 2022-12-25 DIAGNOSIS — M9901 Segmental and somatic dysfunction of cervical region: Secondary | ICD-10-CM | POA: Diagnosis not present

## 2022-12-25 DIAGNOSIS — M9905 Segmental and somatic dysfunction of pelvic region: Secondary | ICD-10-CM | POA: Diagnosis not present

## 2022-12-25 DIAGNOSIS — M9904 Segmental and somatic dysfunction of sacral region: Secondary | ICD-10-CM | POA: Diagnosis not present

## 2022-12-27 DIAGNOSIS — M9901 Segmental and somatic dysfunction of cervical region: Secondary | ICD-10-CM | POA: Diagnosis not present

## 2022-12-27 DIAGNOSIS — M9903 Segmental and somatic dysfunction of lumbar region: Secondary | ICD-10-CM | POA: Diagnosis not present

## 2022-12-27 DIAGNOSIS — M9904 Segmental and somatic dysfunction of sacral region: Secondary | ICD-10-CM | POA: Diagnosis not present

## 2022-12-27 DIAGNOSIS — M9905 Segmental and somatic dysfunction of pelvic region: Secondary | ICD-10-CM | POA: Diagnosis not present

## 2023-01-01 DIAGNOSIS — M9903 Segmental and somatic dysfunction of lumbar region: Secondary | ICD-10-CM | POA: Diagnosis not present

## 2023-01-01 DIAGNOSIS — M9904 Segmental and somatic dysfunction of sacral region: Secondary | ICD-10-CM | POA: Diagnosis not present

## 2023-01-01 DIAGNOSIS — M9901 Segmental and somatic dysfunction of cervical region: Secondary | ICD-10-CM | POA: Diagnosis not present

## 2023-01-01 DIAGNOSIS — M9905 Segmental and somatic dysfunction of pelvic region: Secondary | ICD-10-CM | POA: Diagnosis not present

## 2023-01-03 DIAGNOSIS — M9901 Segmental and somatic dysfunction of cervical region: Secondary | ICD-10-CM | POA: Diagnosis not present

## 2023-01-03 DIAGNOSIS — M9904 Segmental and somatic dysfunction of sacral region: Secondary | ICD-10-CM | POA: Diagnosis not present

## 2023-01-03 DIAGNOSIS — M9903 Segmental and somatic dysfunction of lumbar region: Secondary | ICD-10-CM | POA: Diagnosis not present

## 2023-01-03 DIAGNOSIS — M9905 Segmental and somatic dysfunction of pelvic region: Secondary | ICD-10-CM | POA: Diagnosis not present

## 2023-01-08 DIAGNOSIS — M9903 Segmental and somatic dysfunction of lumbar region: Secondary | ICD-10-CM | POA: Diagnosis not present

## 2023-01-08 DIAGNOSIS — M9901 Segmental and somatic dysfunction of cervical region: Secondary | ICD-10-CM | POA: Diagnosis not present

## 2023-01-08 DIAGNOSIS — M9904 Segmental and somatic dysfunction of sacral region: Secondary | ICD-10-CM | POA: Diagnosis not present

## 2023-01-08 DIAGNOSIS — M9905 Segmental and somatic dysfunction of pelvic region: Secondary | ICD-10-CM | POA: Diagnosis not present

## 2023-01-10 DIAGNOSIS — M9905 Segmental and somatic dysfunction of pelvic region: Secondary | ICD-10-CM | POA: Diagnosis not present

## 2023-01-10 DIAGNOSIS — M9904 Segmental and somatic dysfunction of sacral region: Secondary | ICD-10-CM | POA: Diagnosis not present

## 2023-01-10 DIAGNOSIS — M9903 Segmental and somatic dysfunction of lumbar region: Secondary | ICD-10-CM | POA: Diagnosis not present

## 2023-01-10 DIAGNOSIS — M9901 Segmental and somatic dysfunction of cervical region: Secondary | ICD-10-CM | POA: Diagnosis not present

## 2023-01-15 DIAGNOSIS — M9901 Segmental and somatic dysfunction of cervical region: Secondary | ICD-10-CM | POA: Diagnosis not present

## 2023-01-15 DIAGNOSIS — M9903 Segmental and somatic dysfunction of lumbar region: Secondary | ICD-10-CM | POA: Diagnosis not present

## 2023-01-15 DIAGNOSIS — M9904 Segmental and somatic dysfunction of sacral region: Secondary | ICD-10-CM | POA: Diagnosis not present

## 2023-01-15 DIAGNOSIS — M9905 Segmental and somatic dysfunction of pelvic region: Secondary | ICD-10-CM | POA: Diagnosis not present

## 2023-02-21 DIAGNOSIS — M9903 Segmental and somatic dysfunction of lumbar region: Secondary | ICD-10-CM | POA: Diagnosis not present

## 2023-02-21 DIAGNOSIS — M9901 Segmental and somatic dysfunction of cervical region: Secondary | ICD-10-CM | POA: Diagnosis not present

## 2023-02-21 DIAGNOSIS — M9904 Segmental and somatic dysfunction of sacral region: Secondary | ICD-10-CM | POA: Diagnosis not present

## 2023-02-21 DIAGNOSIS — M9905 Segmental and somatic dysfunction of pelvic region: Secondary | ICD-10-CM | POA: Diagnosis not present

## 2023-02-26 DIAGNOSIS — M9905 Segmental and somatic dysfunction of pelvic region: Secondary | ICD-10-CM | POA: Diagnosis not present

## 2023-02-26 DIAGNOSIS — M9904 Segmental and somatic dysfunction of sacral region: Secondary | ICD-10-CM | POA: Diagnosis not present

## 2023-02-26 DIAGNOSIS — M9901 Segmental and somatic dysfunction of cervical region: Secondary | ICD-10-CM | POA: Diagnosis not present

## 2023-02-26 DIAGNOSIS — M9903 Segmental and somatic dysfunction of lumbar region: Secondary | ICD-10-CM | POA: Diagnosis not present

## 2023-02-28 ENCOUNTER — Ambulatory Visit: Payer: Medicare HMO

## 2023-03-05 DIAGNOSIS — M9903 Segmental and somatic dysfunction of lumbar region: Secondary | ICD-10-CM | POA: Diagnosis not present

## 2023-03-05 DIAGNOSIS — M9904 Segmental and somatic dysfunction of sacral region: Secondary | ICD-10-CM | POA: Diagnosis not present

## 2023-03-05 DIAGNOSIS — M9905 Segmental and somatic dysfunction of pelvic region: Secondary | ICD-10-CM | POA: Diagnosis not present

## 2023-03-05 DIAGNOSIS — M9901 Segmental and somatic dysfunction of cervical region: Secondary | ICD-10-CM | POA: Diagnosis not present

## 2023-03-12 DIAGNOSIS — M9904 Segmental and somatic dysfunction of sacral region: Secondary | ICD-10-CM | POA: Diagnosis not present

## 2023-03-12 DIAGNOSIS — M9903 Segmental and somatic dysfunction of lumbar region: Secondary | ICD-10-CM | POA: Diagnosis not present

## 2023-03-12 DIAGNOSIS — M9905 Segmental and somatic dysfunction of pelvic region: Secondary | ICD-10-CM | POA: Diagnosis not present

## 2023-03-12 DIAGNOSIS — M9901 Segmental and somatic dysfunction of cervical region: Secondary | ICD-10-CM | POA: Diagnosis not present

## 2023-03-19 DIAGNOSIS — M9901 Segmental and somatic dysfunction of cervical region: Secondary | ICD-10-CM | POA: Diagnosis not present

## 2023-03-19 DIAGNOSIS — M9903 Segmental and somatic dysfunction of lumbar region: Secondary | ICD-10-CM | POA: Diagnosis not present

## 2023-03-19 DIAGNOSIS — M9905 Segmental and somatic dysfunction of pelvic region: Secondary | ICD-10-CM | POA: Diagnosis not present

## 2023-03-19 DIAGNOSIS — M9904 Segmental and somatic dysfunction of sacral region: Secondary | ICD-10-CM | POA: Diagnosis not present

## 2023-03-25 DIAGNOSIS — E1165 Type 2 diabetes mellitus with hyperglycemia: Secondary | ICD-10-CM | POA: Diagnosis not present

## 2023-03-25 DIAGNOSIS — G3183 Dementia with Lewy bodies: Secondary | ICD-10-CM | POA: Diagnosis not present

## 2023-03-25 DIAGNOSIS — F02A0 Dementia in other diseases classified elsewhere, mild, without behavioral disturbance, psychotic disturbance, mood disturbance, and anxiety: Secondary | ICD-10-CM | POA: Diagnosis not present

## 2023-03-26 DIAGNOSIS — M9901 Segmental and somatic dysfunction of cervical region: Secondary | ICD-10-CM | POA: Diagnosis not present

## 2023-03-26 DIAGNOSIS — M9905 Segmental and somatic dysfunction of pelvic region: Secondary | ICD-10-CM | POA: Diagnosis not present

## 2023-03-26 DIAGNOSIS — M9904 Segmental and somatic dysfunction of sacral region: Secondary | ICD-10-CM | POA: Diagnosis not present

## 2023-03-26 DIAGNOSIS — M9903 Segmental and somatic dysfunction of lumbar region: Secondary | ICD-10-CM | POA: Diagnosis not present

## 2023-03-27 ENCOUNTER — Other Ambulatory Visit (HOSPITAL_COMMUNITY): Payer: Self-pay | Admitting: Student

## 2023-03-27 ENCOUNTER — Other Ambulatory Visit: Payer: Self-pay | Admitting: Student

## 2023-03-27 DIAGNOSIS — G3183 Dementia with Lewy bodies: Secondary | ICD-10-CM

## 2023-04-02 ENCOUNTER — Ambulatory Visit: Payer: Medicare HMO | Attending: Cardiology

## 2023-04-02 ENCOUNTER — Other Ambulatory Visit: Payer: Medicare HMO

## 2023-04-02 DIAGNOSIS — I350 Nonrheumatic aortic (valve) stenosis: Secondary | ICD-10-CM | POA: Diagnosis not present

## 2023-04-02 DIAGNOSIS — M9904 Segmental and somatic dysfunction of sacral region: Secondary | ICD-10-CM | POA: Diagnosis not present

## 2023-04-02 DIAGNOSIS — M9903 Segmental and somatic dysfunction of lumbar region: Secondary | ICD-10-CM | POA: Diagnosis not present

## 2023-04-02 DIAGNOSIS — M9905 Segmental and somatic dysfunction of pelvic region: Secondary | ICD-10-CM | POA: Diagnosis not present

## 2023-04-02 DIAGNOSIS — M9901 Segmental and somatic dysfunction of cervical region: Secondary | ICD-10-CM | POA: Diagnosis not present

## 2023-04-02 LAB — ECHOCARDIOGRAM COMPLETE
AR max vel: 0.91 cm2
AV Area VTI: 0.99 cm2
AV Area mean vel: 0.79 cm2
AV Mean grad: 29 mmHg
AV Peak grad: 50.7 mmHg
Ao pk vel: 3.56 m/s
Area-P 1/2: 3.03 cm2
Calc EF: 59 %
S' Lateral: 2.8 cm
Single Plane A2C EF: 56.4 %
Single Plane A4C EF: 56.6 %

## 2023-04-04 ENCOUNTER — Emergency Department (HOSPITAL_COMMUNITY)
Admission: RE | Admit: 2023-04-04 | Discharge: 2023-04-04 | Disposition: A | Discharge status: Home / Self Care | Payer: Medicare HMO | Source: Ambulatory Visit | Attending: Student | Admitting: Student

## 2023-04-04 ENCOUNTER — Emergency Department (HOSPITAL_COMMUNITY)
Admission: EM | Admit: 2023-04-04 | Discharge: 2023-04-04 | Discharge status: Against Medical Advice | Payer: Medicare HMO | Attending: Emergency Medicine | Admitting: Emergency Medicine

## 2023-04-04 ENCOUNTER — Encounter (HOSPITAL_COMMUNITY): Payer: Self-pay

## 2023-04-04 ENCOUNTER — Ambulatory Visit (HOSPITAL_COMMUNITY): Payer: Medicare HMO

## 2023-04-04 DIAGNOSIS — Z5321 Procedure and treatment not carried out due to patient leaving prior to being seen by health care provider: Secondary | ICD-10-CM | POA: Insufficient documentation

## 2023-04-04 DIAGNOSIS — G3183 Dementia with Lewy bodies: Secondary | ICD-10-CM | POA: Insufficient documentation

## 2023-04-04 DIAGNOSIS — G319 Degenerative disease of nervous system, unspecified: Secondary | ICD-10-CM | POA: Diagnosis not present

## 2023-04-04 DIAGNOSIS — Q048 Other specified congenital malformations of brain: Secondary | ICD-10-CM | POA: Diagnosis not present

## 2023-04-04 DIAGNOSIS — F02A0 Dementia in other diseases classified elsewhere, mild, without behavioral disturbance, psychotic disturbance, mood disturbance, and anxiety: Secondary | ICD-10-CM | POA: Insufficient documentation

## 2023-04-04 DIAGNOSIS — Z0189 Encounter for other specified special examinations: Secondary | ICD-10-CM | POA: Diagnosis not present

## 2023-04-04 NOTE — ED Triage Notes (Signed)
Pt had appointment for MRI at 5pm. Pt was not supposed to be in the ER.

## 2023-04-09 DIAGNOSIS — M9905 Segmental and somatic dysfunction of pelvic region: Secondary | ICD-10-CM | POA: Diagnosis not present

## 2023-04-09 DIAGNOSIS — M9903 Segmental and somatic dysfunction of lumbar region: Secondary | ICD-10-CM | POA: Diagnosis not present

## 2023-04-09 DIAGNOSIS — M9904 Segmental and somatic dysfunction of sacral region: Secondary | ICD-10-CM | POA: Diagnosis not present

## 2023-04-09 DIAGNOSIS — M9901 Segmental and somatic dysfunction of cervical region: Secondary | ICD-10-CM | POA: Diagnosis not present

## 2023-04-11 ENCOUNTER — Telehealth: Payer: Self-pay

## 2023-04-11 NOTE — Telephone Encounter (Signed)
Transition Care Management Unsuccessful Follow-up Telephone Call  Date of discharge and from where:  [facility] 7/[facility]  Attempts:  1st Attempt  Reason for unsuccessful TCM follow-up call:  No answer/busy   Lenard Forth [facility] Guide, [facility] [phone] [address] [place], [place], [place] [address] Phone: [phone] Email: [email or web address]

## 2023-04-11 NOTE — Telephone Encounter (Signed)
Transition Care Management Unsuccessful Follow-up Telephone Call  Date of discharge and from where:  [facility]  Attempts:  2nd Attempt  Reason for unsuccessful TCM follow-up call:  No answer/busy   Lenard Forth [facility] Guide, [facility] [phone] [address] [place], [place], [place] [address] Phone: [phone] Email: [email or web address]

## 2023-04-16 DIAGNOSIS — M9903 Segmental and somatic dysfunction of lumbar region: Secondary | ICD-10-CM | POA: Diagnosis not present

## 2023-04-16 DIAGNOSIS — M9901 Segmental and somatic dysfunction of cervical region: Secondary | ICD-10-CM | POA: Diagnosis not present

## 2023-04-16 DIAGNOSIS — M0579 Rheumatoid arthritis with rheumatoid factor of multiple sites without organ or systems involvement: Secondary | ICD-10-CM | POA: Diagnosis not present

## 2023-04-16 DIAGNOSIS — M9905 Segmental and somatic dysfunction of pelvic region: Secondary | ICD-10-CM | POA: Diagnosis not present

## 2023-04-16 DIAGNOSIS — M9904 Segmental and somatic dysfunction of sacral region: Secondary | ICD-10-CM | POA: Diagnosis not present

## 2023-04-16 DIAGNOSIS — Z79899 Other long term (current) drug therapy: Secondary | ICD-10-CM | POA: Diagnosis not present

## 2023-04-19 DIAGNOSIS — F02A0 Dementia in other diseases classified elsewhere, mild, without behavioral disturbance, psychotic disturbance, mood disturbance, and anxiety: Secondary | ICD-10-CM | POA: Diagnosis not present

## 2023-04-19 DIAGNOSIS — I639 Cerebral infarction, unspecified: Secondary | ICD-10-CM | POA: Diagnosis not present

## 2023-04-19 DIAGNOSIS — E1165 Type 2 diabetes mellitus with hyperglycemia: Secondary | ICD-10-CM | POA: Diagnosis not present

## 2023-04-19 DIAGNOSIS — G3183 Dementia with Lewy bodies: Secondary | ICD-10-CM | POA: Diagnosis not present

## 2023-04-23 DIAGNOSIS — M9904 Segmental and somatic dysfunction of sacral region: Secondary | ICD-10-CM | POA: Diagnosis not present

## 2023-04-23 DIAGNOSIS — M9905 Segmental and somatic dysfunction of pelvic region: Secondary | ICD-10-CM | POA: Diagnosis not present

## 2023-04-23 DIAGNOSIS — M9903 Segmental and somatic dysfunction of lumbar region: Secondary | ICD-10-CM | POA: Diagnosis not present

## 2023-04-23 DIAGNOSIS — M9901 Segmental and somatic dysfunction of cervical region: Secondary | ICD-10-CM | POA: Diagnosis not present

## 2023-04-29 DIAGNOSIS — Z0100 Encounter for examination of eyes and vision without abnormal findings: Secondary | ICD-10-CM | POA: Diagnosis not present

## 2023-04-29 DIAGNOSIS — H524 Presbyopia: Secondary | ICD-10-CM | POA: Diagnosis not present

## 2023-04-29 LAB — HM DIABETES EYE EXAM

## 2023-04-30 ENCOUNTER — Encounter: Payer: Self-pay | Admitting: Internal Medicine

## 2023-04-30 DIAGNOSIS — E1165 Type 2 diabetes mellitus with hyperglycemia: Secondary | ICD-10-CM | POA: Insufficient documentation

## 2023-04-30 DIAGNOSIS — M9905 Segmental and somatic dysfunction of pelvic region: Secondary | ICD-10-CM | POA: Diagnosis not present

## 2023-04-30 DIAGNOSIS — M9903 Segmental and somatic dysfunction of lumbar region: Secondary | ICD-10-CM | POA: Diagnosis not present

## 2023-04-30 DIAGNOSIS — M9904 Segmental and somatic dysfunction of sacral region: Secondary | ICD-10-CM | POA: Diagnosis not present

## 2023-04-30 DIAGNOSIS — M9901 Segmental and somatic dysfunction of cervical region: Secondary | ICD-10-CM | POA: Diagnosis not present

## 2023-05-07 DIAGNOSIS — M9903 Segmental and somatic dysfunction of lumbar region: Secondary | ICD-10-CM | POA: Diagnosis not present

## 2023-05-07 DIAGNOSIS — M9905 Segmental and somatic dysfunction of pelvic region: Secondary | ICD-10-CM | POA: Diagnosis not present

## 2023-05-07 DIAGNOSIS — M9901 Segmental and somatic dysfunction of cervical region: Secondary | ICD-10-CM | POA: Diagnosis not present

## 2023-05-07 DIAGNOSIS — M9904 Segmental and somatic dysfunction of sacral region: Secondary | ICD-10-CM | POA: Diagnosis not present

## 2023-05-14 DIAGNOSIS — M9904 Segmental and somatic dysfunction of sacral region: Secondary | ICD-10-CM | POA: Diagnosis not present

## 2023-05-14 DIAGNOSIS — M9901 Segmental and somatic dysfunction of cervical region: Secondary | ICD-10-CM | POA: Diagnosis not present

## 2023-05-14 DIAGNOSIS — M9903 Segmental and somatic dysfunction of lumbar region: Secondary | ICD-10-CM | POA: Diagnosis not present

## 2023-05-14 DIAGNOSIS — M9905 Segmental and somatic dysfunction of pelvic region: Secondary | ICD-10-CM | POA: Diagnosis not present

## 2023-05-16 DIAGNOSIS — M79672 Pain in left foot: Secondary | ICD-10-CM | POA: Diagnosis not present

## 2023-05-16 DIAGNOSIS — D2372 Other benign neoplasm of skin of left lower limb, including hip: Secondary | ICD-10-CM | POA: Diagnosis not present

## 2023-05-16 DIAGNOSIS — E119 Type 2 diabetes mellitus without complications: Secondary | ICD-10-CM | POA: Diagnosis not present

## 2023-05-21 DIAGNOSIS — D2272 Melanocytic nevi of left lower limb, including hip: Secondary | ICD-10-CM | POA: Diagnosis not present

## 2023-05-21 DIAGNOSIS — X32XXXA Exposure to sunlight, initial encounter: Secondary | ICD-10-CM | POA: Diagnosis not present

## 2023-05-21 DIAGNOSIS — M9904 Segmental and somatic dysfunction of sacral region: Secondary | ICD-10-CM | POA: Diagnosis not present

## 2023-05-21 DIAGNOSIS — D2271 Melanocytic nevi of right lower limb, including hip: Secondary | ICD-10-CM | POA: Diagnosis not present

## 2023-05-21 DIAGNOSIS — D225 Melanocytic nevi of trunk: Secondary | ICD-10-CM | POA: Diagnosis not present

## 2023-05-21 DIAGNOSIS — D2262 Melanocytic nevi of left upper limb, including shoulder: Secondary | ICD-10-CM | POA: Diagnosis not present

## 2023-05-21 DIAGNOSIS — M9901 Segmental and somatic dysfunction of cervical region: Secondary | ICD-10-CM | POA: Diagnosis not present

## 2023-05-21 DIAGNOSIS — M9905 Segmental and somatic dysfunction of pelvic region: Secondary | ICD-10-CM | POA: Diagnosis not present

## 2023-05-21 DIAGNOSIS — M9903 Segmental and somatic dysfunction of lumbar region: Secondary | ICD-10-CM | POA: Diagnosis not present

## 2023-05-21 DIAGNOSIS — D2261 Melanocytic nevi of right upper limb, including shoulder: Secondary | ICD-10-CM | POA: Diagnosis not present

## 2023-05-21 DIAGNOSIS — Z85828 Personal history of other malignant neoplasm of skin: Secondary | ICD-10-CM | POA: Diagnosis not present

## 2023-05-21 DIAGNOSIS — L570 Actinic keratosis: Secondary | ICD-10-CM | POA: Diagnosis not present

## 2023-05-28 DIAGNOSIS — M9901 Segmental and somatic dysfunction of cervical region: Secondary | ICD-10-CM | POA: Diagnosis not present

## 2023-05-28 DIAGNOSIS — M9904 Segmental and somatic dysfunction of sacral region: Secondary | ICD-10-CM | POA: Diagnosis not present

## 2023-05-28 DIAGNOSIS — M9905 Segmental and somatic dysfunction of pelvic region: Secondary | ICD-10-CM | POA: Diagnosis not present

## 2023-05-28 DIAGNOSIS — M9903 Segmental and somatic dysfunction of lumbar region: Secondary | ICD-10-CM | POA: Diagnosis not present

## 2023-06-04 DIAGNOSIS — M79672 Pain in left foot: Secondary | ICD-10-CM | POA: Diagnosis not present

## 2023-06-04 DIAGNOSIS — M9903 Segmental and somatic dysfunction of lumbar region: Secondary | ICD-10-CM | POA: Diagnosis not present

## 2023-06-04 DIAGNOSIS — M9904 Segmental and somatic dysfunction of sacral region: Secondary | ICD-10-CM | POA: Diagnosis not present

## 2023-06-04 DIAGNOSIS — E119 Type 2 diabetes mellitus without complications: Secondary | ICD-10-CM | POA: Diagnosis not present

## 2023-06-04 DIAGNOSIS — M9905 Segmental and somatic dysfunction of pelvic region: Secondary | ICD-10-CM | POA: Diagnosis not present

## 2023-06-04 DIAGNOSIS — D2372 Other benign neoplasm of skin of left lower limb, including hip: Secondary | ICD-10-CM | POA: Diagnosis not present

## 2023-06-04 DIAGNOSIS — M9901 Segmental and somatic dysfunction of cervical region: Secondary | ICD-10-CM | POA: Diagnosis not present

## 2023-06-11 DIAGNOSIS — M9903 Segmental and somatic dysfunction of lumbar region: Secondary | ICD-10-CM | POA: Diagnosis not present

## 2023-06-11 DIAGNOSIS — M9901 Segmental and somatic dysfunction of cervical region: Secondary | ICD-10-CM | POA: Diagnosis not present

## 2023-06-11 DIAGNOSIS — M9905 Segmental and somatic dysfunction of pelvic region: Secondary | ICD-10-CM | POA: Diagnosis not present

## 2023-06-11 DIAGNOSIS — M9904 Segmental and somatic dysfunction of sacral region: Secondary | ICD-10-CM | POA: Diagnosis not present

## 2023-06-18 DIAGNOSIS — M9903 Segmental and somatic dysfunction of lumbar region: Secondary | ICD-10-CM | POA: Diagnosis not present

## 2023-06-18 DIAGNOSIS — M9904 Segmental and somatic dysfunction of sacral region: Secondary | ICD-10-CM | POA: Diagnosis not present

## 2023-06-18 DIAGNOSIS — M9901 Segmental and somatic dysfunction of cervical region: Secondary | ICD-10-CM | POA: Diagnosis not present

## 2023-06-18 DIAGNOSIS — M9905 Segmental and somatic dysfunction of pelvic region: Secondary | ICD-10-CM | POA: Diagnosis not present

## 2023-06-25 DIAGNOSIS — M9901 Segmental and somatic dysfunction of cervical region: Secondary | ICD-10-CM | POA: Diagnosis not present

## 2023-06-25 DIAGNOSIS — M9904 Segmental and somatic dysfunction of sacral region: Secondary | ICD-10-CM | POA: Diagnosis not present

## 2023-06-25 DIAGNOSIS — M9905 Segmental and somatic dysfunction of pelvic region: Secondary | ICD-10-CM | POA: Diagnosis not present

## 2023-06-25 DIAGNOSIS — M9903 Segmental and somatic dysfunction of lumbar region: Secondary | ICD-10-CM | POA: Diagnosis not present

## 2023-07-02 DIAGNOSIS — M9903 Segmental and somatic dysfunction of lumbar region: Secondary | ICD-10-CM | POA: Diagnosis not present

## 2023-07-02 DIAGNOSIS — M9905 Segmental and somatic dysfunction of pelvic region: Secondary | ICD-10-CM | POA: Diagnosis not present

## 2023-07-02 DIAGNOSIS — M9904 Segmental and somatic dysfunction of sacral region: Secondary | ICD-10-CM | POA: Diagnosis not present

## 2023-07-02 DIAGNOSIS — M9901 Segmental and somatic dysfunction of cervical region: Secondary | ICD-10-CM | POA: Diagnosis not present

## 2023-07-06 ENCOUNTER — Other Ambulatory Visit: Payer: Self-pay | Admitting: Internal Medicine

## 2023-07-09 DIAGNOSIS — M9905 Segmental and somatic dysfunction of pelvic region: Secondary | ICD-10-CM | POA: Diagnosis not present

## 2023-07-09 DIAGNOSIS — M9901 Segmental and somatic dysfunction of cervical region: Secondary | ICD-10-CM | POA: Diagnosis not present

## 2023-07-09 DIAGNOSIS — M9903 Segmental and somatic dysfunction of lumbar region: Secondary | ICD-10-CM | POA: Diagnosis not present

## 2023-07-09 DIAGNOSIS — M9904 Segmental and somatic dysfunction of sacral region: Secondary | ICD-10-CM | POA: Diagnosis not present

## 2023-07-16 DIAGNOSIS — M9901 Segmental and somatic dysfunction of cervical region: Secondary | ICD-10-CM | POA: Diagnosis not present

## 2023-07-16 DIAGNOSIS — M9903 Segmental and somatic dysfunction of lumbar region: Secondary | ICD-10-CM | POA: Diagnosis not present

## 2023-07-16 DIAGNOSIS — M9905 Segmental and somatic dysfunction of pelvic region: Secondary | ICD-10-CM | POA: Diagnosis not present

## 2023-07-16 DIAGNOSIS — M9904 Segmental and somatic dysfunction of sacral region: Secondary | ICD-10-CM | POA: Diagnosis not present

## 2023-07-23 DIAGNOSIS — M9901 Segmental and somatic dysfunction of cervical region: Secondary | ICD-10-CM | POA: Diagnosis not present

## 2023-07-23 DIAGNOSIS — M9904 Segmental and somatic dysfunction of sacral region: Secondary | ICD-10-CM | POA: Diagnosis not present

## 2023-07-23 DIAGNOSIS — M9903 Segmental and somatic dysfunction of lumbar region: Secondary | ICD-10-CM | POA: Diagnosis not present

## 2023-07-23 DIAGNOSIS — M9905 Segmental and somatic dysfunction of pelvic region: Secondary | ICD-10-CM | POA: Diagnosis not present

## 2023-07-30 DIAGNOSIS — M9903 Segmental and somatic dysfunction of lumbar region: Secondary | ICD-10-CM | POA: Diagnosis not present

## 2023-07-30 DIAGNOSIS — M9904 Segmental and somatic dysfunction of sacral region: Secondary | ICD-10-CM | POA: Diagnosis not present

## 2023-07-30 DIAGNOSIS — M9901 Segmental and somatic dysfunction of cervical region: Secondary | ICD-10-CM | POA: Diagnosis not present

## 2023-07-30 DIAGNOSIS — M9905 Segmental and somatic dysfunction of pelvic region: Secondary | ICD-10-CM | POA: Diagnosis not present

## 2023-08-05 ENCOUNTER — Telehealth: Payer: Self-pay | Admitting: Internal Medicine

## 2023-08-05 NOTE — Telephone Encounter (Signed)
Copied from CRM [id number]. Topic: Medicare AWV >> Aug 05, 2023  9:37 AM Payton Doughty wrote: Reason for CRM: Called LVM 08/05/2023 to schedule Annual Wellness Visit  Verlee Rossetti; Care Guide Ambulatory Clinical Support [place] [facility] Direct Dial: [phone]

## 2023-08-06 DIAGNOSIS — M9903 Segmental and somatic dysfunction of lumbar region: Secondary | ICD-10-CM | POA: Diagnosis not present

## 2023-08-06 DIAGNOSIS — M9904 Segmental and somatic dysfunction of sacral region: Secondary | ICD-10-CM | POA: Diagnosis not present

## 2023-08-06 DIAGNOSIS — M9905 Segmental and somatic dysfunction of pelvic region: Secondary | ICD-10-CM | POA: Diagnosis not present

## 2023-08-06 DIAGNOSIS — M9901 Segmental and somatic dysfunction of cervical region: Secondary | ICD-10-CM | POA: Diagnosis not present

## 2023-08-13 DIAGNOSIS — M9905 Segmental and somatic dysfunction of pelvic region: Secondary | ICD-10-CM | POA: Diagnosis not present

## 2023-08-13 DIAGNOSIS — M9901 Segmental and somatic dysfunction of cervical region: Secondary | ICD-10-CM | POA: Diagnosis not present

## 2023-08-13 DIAGNOSIS — M9904 Segmental and somatic dysfunction of sacral region: Secondary | ICD-10-CM | POA: Diagnosis not present

## 2023-08-13 DIAGNOSIS — M9903 Segmental and somatic dysfunction of lumbar region: Secondary | ICD-10-CM | POA: Diagnosis not present

## 2023-08-19 DIAGNOSIS — Z79899 Other long term (current) drug therapy: Secondary | ICD-10-CM | POA: Diagnosis not present

## 2023-08-19 DIAGNOSIS — I639 Cerebral infarction, unspecified: Secondary | ICD-10-CM | POA: Diagnosis not present

## 2023-08-19 DIAGNOSIS — G3183 Dementia with Lewy bodies: Secondary | ICD-10-CM | POA: Diagnosis not present

## 2023-08-19 DIAGNOSIS — F02A0 Dementia in other diseases classified elsewhere, mild, without behavioral disturbance, psychotic disturbance, mood disturbance, and anxiety: Secondary | ICD-10-CM | POA: Diagnosis not present

## 2023-08-19 DIAGNOSIS — M0579 Rheumatoid arthritis with rheumatoid factor of multiple sites without organ or systems involvement: Secondary | ICD-10-CM | POA: Diagnosis not present

## 2023-08-19 DIAGNOSIS — G459 Transient cerebral ischemic attack, unspecified: Secondary | ICD-10-CM | POA: Diagnosis not present

## 2023-08-20 ENCOUNTER — Ambulatory Visit (INDEPENDENT_AMBULATORY_CARE_PROVIDER_SITE_OTHER): Payer: Medicare HMO | Admitting: *Deleted

## 2023-08-20 VITALS — Ht 70.0 in | Wt 170.0 lb

## 2023-08-20 DIAGNOSIS — Z0000 Encounter for general adult medical examination without abnormal findings: Secondary | ICD-10-CM | POA: Diagnosis not present

## 2023-08-20 DIAGNOSIS — M9903 Segmental and somatic dysfunction of lumbar region: Secondary | ICD-10-CM | POA: Diagnosis not present

## 2023-08-20 DIAGNOSIS — M9904 Segmental and somatic dysfunction of sacral region: Secondary | ICD-10-CM | POA: Diagnosis not present

## 2023-08-20 DIAGNOSIS — M9901 Segmental and somatic dysfunction of cervical region: Secondary | ICD-10-CM | POA: Diagnosis not present

## 2023-08-20 DIAGNOSIS — M9905 Segmental and somatic dysfunction of pelvic region: Secondary | ICD-10-CM | POA: Diagnosis not present

## 2023-08-20 NOTE — Patient Instructions (Signed)
[name] , Thank you for taking time to come for your Medicare Wellness Visit. I appreciate your ongoing commitment to your health goals. Please review the following plan we discussed and let me know if I can assist you in the future.   Referrals/Orders/Follow-Ups/Clinician Recommendations: Consider updating your vaccines Pneumonia,  This is a list of the screening recommended for you and due dates:  Health Maintenance  Topic Date Due   Pneumonia Vaccine (3 of 3 - PPSV23 or PCV20) 12/04/2017   Yearly kidney health urinalysis for diabetes  01/11/2023   Complete foot exam   01/11/2023   Hemoglobin A1C  01/15/2023   Yearly kidney function blood test for diabetes  07/18/2023   Flu Shot  04/10/2024*   Colon Cancer Screening  02/09/2024   Eye exam for diabetics  04/28/2024   Medicare Annual Wellness Visit  08/19/2024   DTaP/Tdap/Td vaccine (3 - Td or Tdap) 05/30/2032   Hepatitis C Screening  Completed   Zoster (Shingles) Vaccine  Completed   HPV Vaccine  Aged Out   COVID-19 Vaccine  Discontinued  *Topic was postponed. The date shown is not the original due date.    Advanced directives: (Declined) Advance directive discussed with you today. Even though you declined this today, please call our office should you change your mind, and we can give you the proper paperwork for you to fill out.  Next Medicare Annual Wellness Visit scheduled for next year: Yes 08/24/24 @ 11:30

## 2023-08-20 NOTE — Progress Notes (Signed)
Subjective:   Donald [name] is a 77 y.o. male who presents for Medicare Annual/Subsequent preventive examination.  Visit Complete: Virtual I connected with  Alin Maisto Szwed on 08/20/23 by a audio enabled telemedicine application and verified that I am speaking with the correct person using two identifiers.  Patient Location: Home  Provider Location: Office/Clinic  I discussed the limitations of evaluation and management by telemedicine. The patient expressed understanding and agreed to proceed.  Vital Signs: Because this visit was a virtual/telehealth visit, some criteria may be missing or patient reported. Any vitals not documented were not able to be obtained and vitals that have been documented are patient reported.   Cardiac Risk Factors include: advanced age (>68men, >67 women);diabetes mellitus;dyslipidemia;male gender     Objective:    Today's Vitals   08/20/23 1416  Weight: 170 lb (77.1 kg)  Height: 5\' 10"  (1.778 m)   Body mass index is 24.39 kg/m.     08/20/2023    2:27 PM 02/26/2022   12:41 PM 02/23/2021    9:46 AM 02/23/2020    8:56 AM 02/13/2019   11:46 AM 12/13/2017   10:36 AM 12/12/2016   10:23 AM  Advanced Directives  Does Patient Have a Medical Advance Directive? No No No No No No No  Does patient want to make changes to medical advance directive?       Yes (MAU/Ambulatory/Procedural Areas - Information given)  Would patient like information on creating a medical advance directive? No - Patient declined No - Patient declined No - Patient declined No - Patient declined Yes (MAU/Ambulatory/Procedural Areas - Information given) No - Patient declined     Current Medications (verified) Outpatient Encounter Medications as of 08/20/2023  Medication Sig   ACCU-CHEK AVIVA PLUS test strip TEST TWO TIMES DAILY   ACCU-CHEK SOFTCLIX LANCETS lancets TEST TWO TIMES DAILY   cetirizine (ZYRTEC) 10 MG tablet Take 10 mg by mouth as needed for allergies.   leflunomide (ARAVA) 10 MG  tablet Take 10 mg by mouth daily.    Multiple Vitamin (MULTI-VITAMIN) tablet Take 1 tablet by mouth daily.   rivastigmine (EXELON) 1.5 MG capsule Take 1.5 mg by mouth 2 (two) times daily.   rosuvastatin (CRESTOR) 5 MG tablet TAKE 1 TABLET EVERY DAY   gabapentin (NEURONTIN) 100 MG capsule TAKE 1 CAPSULE AT BEDTIME (Patient not taking: Reported on 08/20/2023)   metaxalone (SKELAXIN) 800 MG tablet 1/2 - 1 tablet bid prn (Patient not taking: Reported on 08/20/2023)   No facility-administered encounter medications on file as of 08/20/2023.    Allergies (verified) Patient has no known allergies.   History: Past Medical History:  Diagnosis Date   Carpal tunnel syndrome    Diabetes mellitus without complication (HCC)    Nephrolithiasis    H/O stones   Rheumatoid arthritis (HCC)    Past Surgical History:  Procedure Laterality Date   APPENDECTOMY  1962   CARPAL TUNNEL RELEASE Bilateral    hands   Family History  Problem Relation Age of Onset   Heart disease Father    Hypertension Father    Diabetes Maternal Grandmother    Social History   Socioeconomic History   Marital status: Married    Spouse name: Not on file   Number of children: Not on file   Years of education: Not on file   Highest education level: Not on file  Occupational History   Not on file  Tobacco Use   Smoking status: Former   Smokeless  tobacco: Never  Vaping Use   Vaping status: Never Used  Substance and Sexual Activity   Alcohol use: No    Alcohol/week: 0.0 standard drinks of alcohol   Drug use: No   Sexual activity: Not Currently  Other Topics Concern   Not on file  Social History Narrative   Married   Social Determinants of Health   Financial Resource Strain: Low Risk  (08/20/2023)   Overall Financial Resource Strain (CARDIA)    Difficulty of Paying Living Expenses: Not hard at all  Food Insecurity: No Food Insecurity (08/20/2023)   Hunger Vital Sign    Worried About Running Out of Food in  the Last Year: Never true    Ran Out of Food in the Last Year: Never true  Transportation Needs: No Transportation Needs (08/20/2023)   PRAPARE - Administrator, Civil Service (Medical): No    Lack of Transportation (Non-Medical): No  Physical Activity: Inactive (08/20/2023)   Exercise Vital Sign    Days of Exercise per Week: 0 days    Minutes of Exercise per Session: 0 min  Stress: No Stress Concern Present (08/20/2023)   [employer] of Occupational Health - Occupational Stress Questionnaire    Feeling of Stress : Not at all  Social Connections: Moderately Integrated (08/20/2023)   Social Connection and Isolation Panel [NHANES]    Frequency of Communication with Friends and Family: More than three times a week    Frequency of Social Gatherings with Friends and Family: Three times a week    Attends Religious Services: More than 4 times per year    Active Member of Clubs or Organizations: No    Attends Banker Meetings: Never    Marital Status: Married    Tobacco Counseling Counseling given: Not Answered   Clinical Intake:  Pre-visit preparation completed: Yes  Pain : No/denies pain     BMI - recorded: 24.39 Nutritional Status: BMI of 19-24  Normal Nutritional Risks: None Diabetes: Yes CBG done?: No Did pt. bring in CBG monitor from home?: No  How often do you need to have someone help you when you read instructions, pamphlets, or other written materials from your doctor or pharmacy?: 1 - Never  Interpreter Needed?: No  Information entered by :: R. Sabrea Sankey LPN   Activities of Daily Living    08/20/2023    2:17 PM  In your present state of health, do you have any difficulty performing the following activities:  Hearing? 0  Vision? 0  Comment glasses  Difficulty concentrating or making decisions? 0  Walking or climbing stairs? 0  Dressing or bathing? 0  Doing errands, shopping? 0  Preparing Food and eating ? N  Using the Toilet? N   In the past six months, have you accidently leaked urine? N  Do you have problems with loss of bowel control? N  Managing your Medications? N  Managing your Finances? N  Housekeeping or managing your Housekeeping? N    Patient Care Team: Dale [place], MD as PCP - General (Internal Medicine) Debbe Odea, MD as PCP - Cardiology (Cardiology)  Indicate any recent Medical Services you may have received from other than [facility] providers in the past year (date may be approximate).     Assessment:   This is a routine wellness examination for Reg.  Hearing/Vision screen Hearing Screening - Comments:: No issues Vision Screening - Comments:: glasses   Goals Addressed  This Visit's Progress    Patient Stated       Wants to eat well and stay active       Depression Screen    08/20/2023    2:23 PM 07/17/2022    8:35 AM 02/26/2022   12:45 PM 01/10/2022    8:25 AM 07/13/2021    8:41 AM 02/23/2021    9:45 AM 02/23/2020    8:58 AM  PHQ 2/9 Scores  PHQ - 2 Score 0 0 0 0 0 0 0  PHQ- 9 Score 0          Fall Risk    08/20/2023    2:19 PM 07/17/2022    8:34 AM 02/26/2022   12:43 PM 01/10/2022    8:25 AM 07/13/2021    8:41 AM  Fall Risk   Falls in the past year? 1 1 0 0 0  Number falls in past yr: 0 0   0  Injury with Fall? 1 1   0  Risk for fall due to : History of fall(s);Impaired balance/gait History of fall(s)  No Fall Risks   Follow up Falls evaluation completed;Falls prevention discussed Falls evaluation completed Falls evaluation completed Falls evaluation completed Falls evaluation completed    MEDICARE RISK AT HOME: Medicare Risk at Home Any stairs in or around the home?: Yes If so, are there any without handrails?: No Home free of loose throw rugs in walkways, pet beds, electrical cords, etc?: Yes Adequate lighting in your home to reduce risk of falls?: Yes Life alert?: No Use of a cane, walker or w/c?: No Grab bars in the bathroom?: No Shower  chair or bench in shower?: No Elevated toilet seat or a handicapped toilet?: Yes     Cognitive Function:    12/13/2017   10:36 AM 12/12/2016   10:32 AM 12/13/2015    9:51 AM  MMSE - Mini Mental State Exam  Orientation to time 5 5 5   Orientation to Place 5 5 5   Registration 3 3 3   Attention/ Calculation 5 5 5   Recall 1 3 3   Language- name 2 objects 2 2 2   Language- repeat 1 1 1   Language- follow 3 step command 3 3 3   Language- read & follow direction 1 1 1   Write a sentence 1 1 1   Copy design 1 1 1   Total score 28 30 30         08/20/2023    2:27 PM 02/23/2021    9:48 AM 02/23/2020    9:01 AM 02/13/2019   11:50 AM  6CIT Screen  What Year? 0 points 0 points 0 points 0 points  What month? 0 points 0 points 0 points 0 points  What time? 0 points 0 points  0 points  Count back from 20 0 points 0 points  0 points  Months in reverse 2 points 0 points 0 points 0 points  Repeat phrase 4 points 0 points  0 points  Total Score 6 points 0 points  0 points    Immunizations Immunization History  Administered Date(s) Administered   Pneumococcal Conjugate-13 06/04/2016   Pneumococcal Polysaccharide-23 12/04/2012   Td 05/14/2011   Zoster Recombinant(Shingrix) 05/30/2022   Zoster, Live 04/04/2012, 06/19/2013    TDAP status: Up to date  Flu Vaccine status: Declined, Education has been provided regarding the importance of this vaccine but patient still declined. Advised may receive this vaccine at local pharmacy or Health Dept. Aware to provide a copy of the vaccination record  if obtained from local pharmacy or Health Dept. Verbalized acceptance and understanding.  Pneumococcal vaccine status: Due, Education has been provided regarding the importance of this vaccine. Advised may receive this vaccine at local pharmacy or Health Dept. Aware to provide a copy of the vaccination record if obtained from local pharmacy or Health Dept. Verbalized acceptance and understanding.  Covid-19 vaccine  status: Declined, Education has been provided regarding the importance of this vaccine but patient still declined. Advised may receive this vaccine at local pharmacy or Health Dept.or vaccine clinic. Aware to provide a copy of the vaccination record if obtained from local pharmacy or Health Dept. Verbalized acceptance and understanding.  Qualifies for Shingles Vaccine? Yes   Zostavax completed Yes   Shingrix Completed?: Yes  Screening Tests Health Maintenance  Topic Date Due   Pneumonia Vaccine 77+ Years old (3 of 3 - PPSV23 or PCV20) 12/04/2017   DTaP/Tdap/Td (2 - Tdap) 05/13/2021   Zoster Vaccines- Shingrix (2 of 2) 07/25/2022   Diabetic kidney evaluation - Urine ACR  01/11/2023   FOOT EXAM  01/11/2023   HEMOGLOBIN A1C  01/15/2023   INFLUENZA VACCINE  Never done   Diabetic kidney evaluation - eGFR measurement  07/18/2023   Colonoscopy  02/09/2024   OPHTHALMOLOGY EXAM  04/28/2024   Medicare Annual Wellness (AWV)  08/19/2024   Hepatitis C Screening  Completed   HPV VACCINES  Aged Out   COVID-19 Vaccine  Discontinued    Health Maintenance  Health Maintenance Due  Topic Date Due   Pneumonia Vaccine 73+ Years old (3 of 3 - PPSV23 or PCV20) 12/04/2017   DTaP/Tdap/Td (2 - Tdap) 05/13/2021   Zoster Vaccines- Shingrix (2 of 2) 07/25/2022   Diabetic kidney evaluation - Urine ACR  01/11/2023   FOOT EXAM  01/11/2023   HEMOGLOBIN A1C  01/15/2023   INFLUENZA VACCINE  Never done   Diabetic kidney evaluation - eGFR measurement  07/18/2023    Colorectal cancer screening: No longer required.   Lung Cancer Screening: (Low Dose CT Chest recommended if Age 104-80 years, 20 pack-year currently smoking OR have quit w/in 15years.) does not qualify.     Additional Screening:  Hepatitis C Screening: does qualify; Completed 12/2016  Vision Screening: Recommended annual ophthalmology exams for early detection of glaucoma and other disorders of the eye. Is the patient up to date with their  annual eye exam?  Yes  Who is the provider or what is the name of the office in which the patient attends annual eye exams? [facility] If pt is not established with a provider, would they like to be referred to a provider to establish care? No .   Dental Screening: Recommended annual dental exams for proper oral hygiene  Diabetic Foot Exam: Diabetic Foot Exam: Overdue, Pt has been advised about the importance in completing this exam. Pt is scheduled for diabetic foot exam on 09/25/23. Patient has an appointment with PCP.  Community Resource Referral / Chronic Care Management: CRR required this visit?  No   CCM required this visit?  No     Plan:     I have personally reviewed and noted the following in the patient's chart:   Medical and social history Use of alcohol, tobacco or illicit drugs  Current medications and supplements including opioid prescriptions. Patient is not currently taking opioid prescriptions. Functional ability and status Nutritional status Physical activity Advanced directives List of other physicians Hospitalizations, surgeries, and ER visits in previous 12 months Vitals Screenings to  include cognitive, depression, and falls Referrals and appointments  In addition, I have reviewed and discussed with patient certain preventive protocols, quality metrics, and best practice recommendations. A written personalized care plan for preventive services as well as general preventive health recommendations were provided to patient.     Sydell Axon, LPN   84/13/2440   After Visit Summary: (MyChart) Due to this being a telephonic visit, the after visit summary with patients personalized plan was offered to patient via MyChart   Nurse Notes: see routing comments

## 2023-08-27 DIAGNOSIS — M9904 Segmental and somatic dysfunction of sacral region: Secondary | ICD-10-CM | POA: Diagnosis not present

## 2023-08-27 DIAGNOSIS — M9903 Segmental and somatic dysfunction of lumbar region: Secondary | ICD-10-CM | POA: Diagnosis not present

## 2023-08-27 DIAGNOSIS — M9905 Segmental and somatic dysfunction of pelvic region: Secondary | ICD-10-CM | POA: Diagnosis not present

## 2023-08-27 DIAGNOSIS — M9901 Segmental and somatic dysfunction of cervical region: Secondary | ICD-10-CM | POA: Diagnosis not present

## 2023-09-03 DIAGNOSIS — M9904 Segmental and somatic dysfunction of sacral region: Secondary | ICD-10-CM | POA: Diagnosis not present

## 2023-09-03 DIAGNOSIS — M9903 Segmental and somatic dysfunction of lumbar region: Secondary | ICD-10-CM | POA: Diagnosis not present

## 2023-09-03 DIAGNOSIS — M9905 Segmental and somatic dysfunction of pelvic region: Secondary | ICD-10-CM | POA: Diagnosis not present

## 2023-09-03 DIAGNOSIS — M9901 Segmental and somatic dysfunction of cervical region: Secondary | ICD-10-CM | POA: Diagnosis not present

## 2023-09-10 DIAGNOSIS — M9903 Segmental and somatic dysfunction of lumbar region: Secondary | ICD-10-CM | POA: Diagnosis not present

## 2023-09-10 DIAGNOSIS — M9905 Segmental and somatic dysfunction of pelvic region: Secondary | ICD-10-CM | POA: Diagnosis not present

## 2023-09-10 DIAGNOSIS — M9904 Segmental and somatic dysfunction of sacral region: Secondary | ICD-10-CM | POA: Diagnosis not present

## 2023-09-10 DIAGNOSIS — M9901 Segmental and somatic dysfunction of cervical region: Secondary | ICD-10-CM | POA: Diagnosis not present

## 2023-09-17 DIAGNOSIS — M9905 Segmental and somatic dysfunction of pelvic region: Secondary | ICD-10-CM | POA: Diagnosis not present

## 2023-09-17 DIAGNOSIS — M9901 Segmental and somatic dysfunction of cervical region: Secondary | ICD-10-CM | POA: Diagnosis not present

## 2023-09-17 DIAGNOSIS — M9903 Segmental and somatic dysfunction of lumbar region: Secondary | ICD-10-CM | POA: Diagnosis not present

## 2023-09-17 DIAGNOSIS — M9904 Segmental and somatic dysfunction of sacral region: Secondary | ICD-10-CM | POA: Diagnosis not present

## 2023-09-24 DIAGNOSIS — M9904 Segmental and somatic dysfunction of sacral region: Secondary | ICD-10-CM | POA: Diagnosis not present

## 2023-09-24 DIAGNOSIS — M9903 Segmental and somatic dysfunction of lumbar region: Secondary | ICD-10-CM | POA: Diagnosis not present

## 2023-09-24 DIAGNOSIS — M9901 Segmental and somatic dysfunction of cervical region: Secondary | ICD-10-CM | POA: Diagnosis not present

## 2023-09-24 DIAGNOSIS — M9905 Segmental and somatic dysfunction of pelvic region: Secondary | ICD-10-CM | POA: Diagnosis not present

## 2023-09-25 ENCOUNTER — Ambulatory Visit: Payer: Medicare HMO | Admitting: Internal Medicine

## 2023-09-25 VITALS — BP 106/68 | HR 70 | Temp 98.3°F | Resp 16 | Ht 70.0 in | Wt 167.2 lb

## 2023-09-25 DIAGNOSIS — E1165 Type 2 diabetes mellitus with hyperglycemia: Secondary | ICD-10-CM | POA: Diagnosis not present

## 2023-09-25 DIAGNOSIS — I350 Nonrheumatic aortic (valve) stenosis: Secondary | ICD-10-CM | POA: Diagnosis not present

## 2023-09-25 DIAGNOSIS — M545 Low back pain, unspecified: Secondary | ICD-10-CM

## 2023-09-25 DIAGNOSIS — M069 Rheumatoid arthritis, unspecified: Secondary | ICD-10-CM | POA: Diagnosis not present

## 2023-09-25 DIAGNOSIS — E7800 Pure hypercholesterolemia, unspecified: Secondary | ICD-10-CM | POA: Diagnosis not present

## 2023-09-25 DIAGNOSIS — R6889 Other general symptoms and signs: Secondary | ICD-10-CM | POA: Diagnosis not present

## 2023-09-25 DIAGNOSIS — Z125 Encounter for screening for malignant neoplasm of prostate: Secondary | ICD-10-CM

## 2023-09-25 DIAGNOSIS — D649 Anemia, unspecified: Secondary | ICD-10-CM

## 2023-09-25 LAB — CBC WITH DIFFERENTIAL/PLATELET
Basophils Absolute: 0.1 10*3/uL (ref 0.0–0.1)
Basophils Relative: 1.5 % (ref 0.0–3.0)
Eosinophils Absolute: 0.2 10*3/uL (ref 0.0–0.7)
Eosinophils Relative: 3.8 % (ref 0.0–5.0)
HCT: 39.3 % (ref 39.0–52.0)
Hemoglobin: 13.4 g/dL (ref 13.0–17.0)
Lymphocytes Relative: 18.6 % (ref 12.0–46.0)
Lymphs Abs: 0.8 10*3/uL (ref 0.7–4.0)
MCHC: 34 g/dL (ref 30.0–36.0)
MCV: 100.6 fL — ABNORMAL HIGH (ref 78.0–100.0)
Monocytes Absolute: 0.6 10*3/uL (ref 0.1–1.0)
Monocytes Relative: 14.1 % — ABNORMAL HIGH (ref 3.0–12.0)
Neutro Abs: 2.7 10*3/uL (ref 1.4–7.7)
Neutrophils Relative %: 62 % (ref 43.0–77.0)
Platelets: 183 10*3/uL (ref 150.0–400.0)
RBC: 3.91 Mil/uL — ABNORMAL LOW (ref 4.22–5.81)
RDW: 12.3 % (ref 11.5–15.5)
WBC: 4.4 10*3/uL (ref 4.0–10.5)

## 2023-09-25 LAB — BASIC METABOLIC PANEL
BUN: 22 mg/dL (ref 6–23)
CO2: 28 meq/L (ref 19–32)
Calcium: 9.5 mg/dL (ref 8.4–10.5)
Chloride: 102 meq/L (ref 96–112)
Creatinine, Ser: 1.23 mg/dL (ref 0.40–1.50)
GFR: 57.37 mL/min — ABNORMAL LOW (ref >60.00)
Glucose, Bld: 104 mg/dL — ABNORMAL HIGH (ref 70–99)
Potassium: 5.1 meq/L (ref 3.5–5.1)
Sodium: 138 meq/L (ref 135–145)

## 2023-09-25 LAB — LIPID PANEL
Cholesterol: 136 mg/dL (ref 0–200)
HDL: 39.7 mg/dL (ref >39.00)
LDL Cholesterol: 46 mg/dL (ref 0–99)
NonHDL: 96.61
Total CHOL/HDL Ratio: 3
Triglycerides: 253 mg/dL — ABNORMAL HIGH (ref 0.0–149.0)
VLDL: 50.6 mg/dL — ABNORMAL HIGH (ref 0.0–40.0)

## 2023-09-25 LAB — HEPATIC FUNCTION PANEL
ALT: 17 U/L (ref 0–53)
AST: 20 U/L (ref 0–37)
Albumin: 4.6 g/dL (ref 3.5–5.2)
Alkaline Phosphatase: 59 U/L (ref 39–117)
Bilirubin, Direct: 0.1 mg/dL (ref 0.0–0.3)
Total Bilirubin: 0.5 mg/dL (ref 0.2–1.2)
Total Protein: 6.7 g/dL (ref 6.0–8.3)

## 2023-09-25 LAB — MICROALBUMIN / CREATININE URINE RATIO
Creatinine,U: 225.3 mg/dL
Microalb Creat Ratio: 2.1 mg/g (ref 0.0–30.0)
Microalb, Ur: 4.6 mg/dL — ABNORMAL HIGH (ref 0.0–1.9)

## 2023-09-25 LAB — IBC + FERRITIN
Ferritin: 353.7 ng/mL — ABNORMAL HIGH (ref 22.0–322.0)
Iron: 76 ug/dL (ref 42–165)
Saturation Ratios: 26.5 % (ref 20.0–50.0)
TIBC: 287 ug/dL (ref 250.0–450.0)
Transferrin: 205 mg/dL — ABNORMAL LOW (ref 212.0–360.0)

## 2023-09-25 LAB — VITAMIN B12: Vitamin B-12: >1537 pg/mL — ABNORMAL HIGH (ref 211–911)

## 2023-09-25 LAB — HM DIABETES FOOT EXAM

## 2023-09-25 LAB — TSH: TSH: 0.88 u[IU]/mL (ref 0.35–5.50)

## 2023-09-25 LAB — PSA, MEDICARE: PSA: 1.19 ng/mL (ref 0.10–4.00)

## 2023-09-25 LAB — HEMOGLOBIN A1C: Hgb A1c MFr Bld: 6.4 % (ref 4.6–6.5)

## 2023-09-25 NOTE — Progress Notes (Signed)
Subjective:    Patient ID: Donald [name], male    DOB: 1946/06/21, 78 y.o.   MRN: [id number]  Patient here for  Chief Complaint  Patient presents with   Medical Management of Chronic Issues    HPI Here for a scheduled follow up - follow up regarding diabetes and hypercholesterolemia. Sees rheumatology - f/u RA. On Arava. Last evalauted 08/19/23. No changes made. Stable. Seeing cardiology - f/u AS. Saw cardiology 09/28/22 - stable. ECHO 03/2023 - moderate to severe AS.  Recommended f/u in 6 months. Saw neurology - per note concern regarding Lewy Body Dementia. Started on rivastigmine. 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 2. Unchanged moderate chronic small vessel ischemic disease and advanced cerebral atrophy. Also recommended aspirin daily and crestor. Carotid ultrasound 08/24/22 - minimal atherosclerosis. Deferred sleep study. Does report some low back pain - center of low back. Notices if stands for a long period. No radicular symptoms. Takes tylenol. Discussed further evaluation - PT, etc. He is seeing a [facility]. Feels this helps. Desires no further intervention at this time.    Past Medical History:  Diagnosis Date   Carpal tunnel syndrome    Diabetes mellitus without complication (HCC)    Nephrolithiasis    H/O stones   Rheumatoid arthritis (HCC)    Past Surgical History:  Procedure Laterality Date   APPENDECTOMY  1962   CARPAL TUNNEL RELEASE Bilateral    hands   Family History  Problem Relation Age of Onset   Heart disease Father    Hypertension Father    Diabetes Maternal Grandmother    Social History   Socioeconomic History   Marital status: Married    Spouse name: Not on file   Number of children: Not on file   Years of education: Not on file   Highest education level: Not on file  Occupational History   Not on file  Tobacco Use   Smoking status: Former   Smokeless tobacco: Never  Vaping  Use   Vaping status: Never Used  Substance and Sexual Activity   Alcohol use: No    Alcohol/week: 0.0 standard drinks of alcohol   Drug use: No   Sexual activity: Not Currently  Other Topics Concern   Not on file  Social History Narrative   Married   Social Drivers of Health   Financial Resource Strain: Low Risk  (08/20/2023)   Overall Financial Resource Strain (CARDIA)    Difficulty of Paying Living Expenses: Not hard at all  Food Insecurity: No Food Insecurity (08/20/2023)   Hunger Vital Sign    Worried About Running Out of Food in the Last Year: Never true    Ran Out of Food in the Last Year: Never true  Transportation Needs: No Transportation Needs (08/20/2023)   PRAPARE - Administrator, Civil Service (Medical): No    Lack of Transportation (Non-Medical): No  Physical Activity: Inactive (08/20/2023)   Exercise Vital Sign    Days of Exercise per Week: 0 days    Minutes of Exercise per Session: 0 min  Stress: No Stress Concern Present (08/20/2023)   [employer] of Occupational Health - Occupational Stress Questionnaire    Feeling of Stress : Not at all  Social Connections: Moderately Integrated (08/20/2023)   Social Connection and Isolation Panel [NHANES]    Frequency of Communication with Friends and Family: More than three times a week    Frequency of  Social Gatherings with Friends and Family: Three times a week    Attends Religious Services: More than 4 times per year    Active Member of Clubs or Organizations: No    Attends Banker Meetings: Never    Marital Status: Married     Review of Systems  Constitutional:  Negative for appetite change and unexpected weight change.  HENT:  Negative for congestion and sinus pressure.   Respiratory:  Negative for cough, chest tightness and shortness of breath.   Cardiovascular:  Negative for chest pain and palpitations.  Gastrointestinal:  Negative for abdominal pain, diarrhea, nausea and  vomiting.  Genitourinary:  Negative for difficulty urinating and dysuria.  Musculoskeletal:  Positive for back pain. Negative for myalgias.  Skin:  Negative for color change and rash.  Neurological:  Negative for dizziness and headaches.  Psychiatric/Behavioral:  Negative for agitation and dysphoric mood.        Objective:     BP 106/68   Pulse 70   Temp 98.3 F (36.8 C)   Resp 16   Ht 5\' 10"  (1.778 m)   Wt 167 lb 3.2 oz (75.8 kg)   SpO2 98%   BMI 23.99 kg/m  Wt Readings from Last 3 Encounters:  09/25/23 167 lb 3.2 oz (75.8 kg)  08/20/23 170 lb (77.1 kg)  09/28/22 173 lb (78.5 kg)    Physical Exam Vitals reviewed.  Constitutional:      General: He is not in acute distress.    Appearance: Normal appearance. He is well-developed.  HENT:     Head: Normocephalic and atraumatic.     Right Ear: External ear normal.     Left Ear: External ear normal.     Mouth/Throat:     Pharynx: No oropharyngeal exudate or posterior oropharyngeal erythema.  Eyes:     General: No scleral icterus.       Right eye: No discharge.        Left eye: No discharge.     Conjunctiva/sclera: Conjunctivae normal.  Cardiovascular:     Rate and Rhythm: Normal rate and regular rhythm.  Pulmonary:     Effort: Pulmonary effort is normal. No respiratory distress.     Breath sounds: Normal breath sounds.  Abdominal:     General: Bowel sounds are normal.     Palpations: Abdomen is soft.     Tenderness: There is no abdominal tenderness.  Musculoskeletal:        General: No swelling or tenderness.     Cervical back: Neck supple. No tenderness.  Lymphadenopathy:     Cervical: No cervical adenopathy.  Skin:    Findings: No erythema or rash.  Neurological:     Mental Status: He is alert.  Psychiatric:        Mood and Affect: Mood normal.        Behavior: Behavior normal.      Diabetic foot exam was performed with the following findings:   No deformities, ulcerations, or other skin  breakdown Normal sensation of 10g monofilament Intact posterior tibialis and dorsalis pedis pulses      Outpatient Encounter Medications as of 09/25/2023  Medication Sig   ACCU-CHEK AVIVA PLUS test strip TEST TWO TIMES DAILY   ACCU-CHEK SOFTCLIX LANCETS lancets TEST TWO TIMES DAILY   cetirizine (ZYRTEC) 10 MG tablet Take 10 mg by mouth as needed for allergies.   leflunomide (ARAVA) 10 MG tablet Take 10 mg by mouth daily.    Multiple Vitamin (MULTI-VITAMIN) tablet  Take 1 tablet by mouth daily.   rivastigmine (EXELON) 1.5 MG capsule Take 1.5 mg by mouth 2 (two) times daily.   rosuvastatin (CRESTOR) 5 MG tablet TAKE 1 TABLET EVERY DAY   [DISCONTINUED] gabapentin (NEURONTIN) 100 MG capsule TAKE 1 CAPSULE AT BEDTIME (Patient not taking: Reported on 08/20/2023)   [DISCONTINUED] metaxalone (SKELAXIN) 800 MG tablet 1/2 - 1 tablet bid prn (Patient not taking: Reported on 08/20/2023)   No facility-administered encounter medications on file as of 09/25/2023.     Lab Results  Component Value Date   WBC 4.4 09/25/2023   HGB 13.4 09/25/2023   HCT 39.3 09/25/2023   PLT 183.0 09/25/2023   GLUCOSE 104 (H) 09/25/2023   CHOL 136 09/25/2023   TRIG 253.0 (H) 09/25/2023   HDL 39.70 09/25/2023   LDLCALC 46 09/25/2023   ALT 17 09/25/2023   AST 20 09/25/2023   NA 138 09/25/2023   K 5.1 09/25/2023   CL 102 09/25/2023   CREATININE 1.23 09/25/2023   BUN 22 09/25/2023   CO2 28 09/25/2023   TSH 0.88 09/25/2023   PSA 1.19 09/25/2023   HGBA1C 6.4 09/25/2023   MICROALBUR 4.6 (H) 09/25/2023    MR BRAIN WO CONTRAST Result Date: 04/15/2023 CLINICAL DATA:  Mild Lewy body dementia. EXAM: MRI HEAD WITHOUT CONTRAST TECHNIQUE: Multiplanar, multiecho pulse sequences of the brain and surrounding structures were obtained without intravenous contrast. Additionally, using NeuroQuant software a 3D volumetric analysis of the brain was performed and is compared to a normative database adjusted for age, gender and  intracranial volume. COMPARISON:  Head MRI 08/08/2022 and CT 07/17/2022 FINDINGS: Brain: There is a 2 mm focus of diffusion weighted and T2/FLAIR hyperintensity in the right external capsule with normal to subtly reduced ADC suggestive of an acute to subacute infarct. A few scattered chronic cerebral microhemorrhages are again seen and are nonspecific. 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 Patchy T2 hyperintensities in the cerebral white matter and pons are unchanged and nonspecific but compatible with moderate chronic small vessel ischemic disease. 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 Cerebral atrophy is unchanged. Asymmetrically prominent choroid plexus calcification in the inferior aspect of the atrium/temporal horn of the left lateral ventricle is unchanged from the prior CT. Vascular: Major intracranial vascular flow voids are preserved. Skull and upper cervical spine: Unremarkable bone marrow signal. Sinuses/Orbits: Unremarkable orbits. Minimal mucosal thickening in the paranasal sinuses. Clear mastoid air cells. Other: None. NeuroQuant Findings: Volumetric analysis of the brain was performed, with a fully detailed report in [facility] Briefly, the comparison with age and gender matched reference reveals whole brain volume to be at the first percentile. IMPRESSION: 1. 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 2. Unchanged moderate chronic small vessel ischemic disease and advanced cerebral atrophy. 3. NeuroQuant volumetric analysis of the brain, see details on [employer]. Electronically Signed   By: Sebastian Ache M.D.   On: 04/15/2023 17:27       Assessment & Plan:  Type 2 diabetes mellitus with hyperglycemia, without long-term current use of insulin (HCC) -     Basic metabolic panel -     Hemoglobin A1c -     Microalbumin / creatinine urine ratio  Hypercholesterolemia Assessment & Plan: Continue crestor.  Low  cholesterol diet and exercise.  Follow lipid panel and liver function tests.   Orders: -     CBC with Differential/Platelet -     Hepatic function panel -  TSH -     Lipid panel  Rheumatoid arthritis, involving unspecified site, unspecified whether rheumatoid factor present [facility]) Assessment & Plan: On leflunomide.  Followed by rheumatology.  Stable.   Orders: -     CBC with Differential/Platelet  Prostate cancer screening -     PSA, Medicare  Anemia, unspecified type Assessment & Plan: Check cbc, b12 and iron studies.   Orders: -     Vitamin B12 -     IBC + Ferritin  Vivid dream Assessment & Plan: Question of parkinsons versus Lewy Body dementia - saw neurology - per note concern regarding Lewy Body Dementia. Started on rivastigmine. 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 2. Unchanged moderate chronic small vessel ischemic disease and advanced cerebral atrophy. Also recommended aspirin daily and crestor. Carotid ultrasound 08/24/22 - minimal atherosclerosis. Deferred sleep study.    Aortic valve stenosis, etiology of cardiac valve disease unspecified Assessment & Plan: ECHO 02/2022 - Echo showed LVEF 50-55%, no WMA, G1DD, trivial MR, mod to severe AS with Aortic valve mean gradient , peak gradient , , Vmax 3.06  m/s, DVI = 0.26, Aortic valve area 0.95.  F/u ECHO 03/2023 - moderate to severe AS.  Recommended f/u in 6 months.    Bilateral low back pain without sciatica, unspecified chronicity Assessment & Plan: Center low back pain as outlined. Discussed further evaluation. Discussed PT. He is seeing chiropractor. Helping. Will notify me if feels needs further intervention.       Dale Ambler, MD

## 2023-09-29 ENCOUNTER — Encounter: Payer: Self-pay | Admitting: Internal Medicine

## 2023-09-29 NOTE — Assessment & Plan Note (Signed)
Check cbc, b12 and iron studies.

## 2023-09-29 NOTE — Assessment & Plan Note (Signed)
ECHO 02/2022 - Echo showed LVEF 50-55%, no WMA, G1DD, trivial MR, mod to severe AS with Aortic valve mean gradient , peak gradient , , Vmax 3.06  m/s, DVI = 0.26, Aortic valve area 0.95.  F/u ECHO 03/2023 - moderate to severe AS.  Recommended f/u in 6 months.

## 2023-09-29 NOTE — Assessment & Plan Note (Signed)
Center low back pain as outlined. Discussed further evaluation. Discussed PT. He is seeing chiropractor. Helping. Will notify me if feels needs further intervention.

## 2023-09-29 NOTE — Assessment & Plan Note (Signed)
Question of parkinsons versus Lewy Body dementia - saw neurology - per note concern regarding Lewy Body Dementia. Started on rivastigmine. 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 2. Unchanged moderate chronic small vessel ischemic disease and advanced cerebral atrophy. Also recommended aspirin daily and crestor. Carotid ultrasound 08/24/22 - minimal atherosclerosis. Deferred sleep study.

## 2023-09-29 NOTE — Assessment & Plan Note (Signed)
Continue crestor.  Low cholesterol diet and exercise.  Follow lipid panel and liver function tests.  

## 2023-09-29 NOTE — Assessment & Plan Note (Signed)
Low carb diet and exercise.  On no medication.  Follow met b and a1c.  

## 2023-09-29 NOTE — Assessment & Plan Note (Signed)
On leflunomide.  Followed by rheumatology.  Stable.

## 2023-10-01 DIAGNOSIS — M9901 Segmental and somatic dysfunction of cervical region: Secondary | ICD-10-CM | POA: Diagnosis not present

## 2023-10-01 DIAGNOSIS — M9905 Segmental and somatic dysfunction of pelvic region: Secondary | ICD-10-CM | POA: Diagnosis not present

## 2023-10-01 DIAGNOSIS — M9903 Segmental and somatic dysfunction of lumbar region: Secondary | ICD-10-CM | POA: Diagnosis not present

## 2023-10-01 DIAGNOSIS — M9904 Segmental and somatic dysfunction of sacral region: Secondary | ICD-10-CM | POA: Diagnosis not present

## 2023-10-02 ENCOUNTER — Other Ambulatory Visit: Payer: Self-pay

## 2023-10-02 DIAGNOSIS — R944 Abnormal results of kidney function studies: Secondary | ICD-10-CM

## 2023-10-08 DIAGNOSIS — R69 Illness, unspecified: Secondary | ICD-10-CM | POA: Diagnosis not present

## 2023-10-10 ENCOUNTER — Ambulatory Visit: Payer: Medicare HMO | Admitting: Orthopedic Surgery

## 2023-10-14 ENCOUNTER — Other Ambulatory Visit (INDEPENDENT_AMBULATORY_CARE_PROVIDER_SITE_OTHER): Payer: Medicare HMO

## 2023-10-14 DIAGNOSIS — R944 Abnormal results of kidney function studies: Secondary | ICD-10-CM | POA: Diagnosis not present

## 2023-10-14 LAB — BASIC METABOLIC PANEL
BUN: 27 mg/dL — ABNORMAL HIGH (ref 6–23)
CO2: 25 meq/L (ref 19–32)
Calcium: 9 mg/dL (ref 8.4–10.5)
Chloride: 105 meq/L (ref 96–112)
Creatinine, Ser: 1.33 mg/dL (ref 0.40–1.50)
GFR: 52.21 mL/min — ABNORMAL LOW (ref >60.00)
Glucose, Bld: 108 mg/dL — ABNORMAL HIGH (ref 70–99)
Potassium: 4.9 meq/L (ref 3.5–5.1)
Sodium: 137 meq/L (ref 135–145)

## 2023-10-14 NOTE — Progress Notes (Signed)
 Referring Physician:  Glendia Shad, MD [address] [place],  [place] [address]  Primary Physician:  Glendia Shad, MD  History of Present Illness: 10/15/2023 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   Pain started after a fall a year and 1/2 ago. He has constant mid to lower back pain. No leg pain. Pain is worse with standing and better with sitting. No numbness, tingling, or weakness.   He states that he was diagnosed with compression fracture in his lower back after the fall.   Bowel/Bladder Dysfunction: none  Conservative measures:  Physical therapy: has not participated in, has been seeing chiropractor with some relief.  Multimodal medical therapy including regular antiinflammatories: tylenol   Injections:  no epidural steroid injections  Past Surgery: no spinal surgeries  Donald [name] has no symptoms of cervical myelopathy.  The symptoms are causing a significant impact on the patient's life.   Review of Systems:  A 10 point review of systems is negative, except for the pertinent positives and negatives detailed in the HPI.  Past Medical History: Past Medical History:  Diagnosis Date   Carpal tunnel syndrome    Diabetes mellitus without complication (HCC)    Nephrolithiasis    H/O stones   Rheumatoid arthritis (HCC)     Past Surgical History: Past Surgical History:  Procedure Laterality Date   APPENDECTOMY  1962   CARPAL TUNNEL RELEASE Bilateral    hands    Allergies: Allergies as of 10/15/2023   (No Known Allergies)    Medications: Outpatient Encounter Medications as of 10/15/2023  Medication Sig   ACCU-CHEK AVIVA PLUS test strip TEST TWO TIMES DAILY   ACCU-CHEK SOFTCLIX LANCETS lancets TEST TWO TIMES DAILY   cetirizine (ZYRTEC) 10 MG tablet Take 10 mg by mouth as needed for allergies.   leflunomide  (ARAVA ) 10 MG tablet Take 10 mg by mouth  daily.    Multiple Vitamin (MULTI-VITAMIN) tablet Take 1 tablet by mouth daily.   rivastigmine  (EXELON ) 1.5 MG capsule Take 1.5 mg by mouth 2 (two) times daily.   rosuvastatin  (CRESTOR ) 5 MG tablet TAKE 1 TABLET EVERY DAY   No facility-administered encounter medications on file as of 10/15/2023.    Social History: Social History   Tobacco Use   Smoking status: Former   Smokeless tobacco: Never  Advertising Account Planner   Vaping status: Never Used  Substance Use Topics   Alcohol use: No    Alcohol/week: 0.0 standard drinks of alcohol   Drug use: No    Family Medical History: Family History  Problem Relation Age of Onset   Heart disease Father    Hypertension Father    Diabetes Maternal Grandmother     Physical Examination: There were no vitals filed for this visit.  General: Patient is well developed, well nourished, calm, collected, and in no apparent distress. Attention to examination is appropriate.  Respiratory: Patient is breathing without any difficulty.   NEUROLOGICAL:     Awake, alert, oriented to person, place, and time.  Speech is clear and fluent. Fund of knowledge is appropriate.   Cranial Nerves: Pupils equal round and reactive to light.  Facial tone is symmetric.    No thoracic or lumbar tenderness.   No abnormal lesions on exposed skin.   Strength: Side Biceps Triceps Deltoid Interossei Grip Wrist Ext. Wrist Flex.  R 5 5 5 5 5 5 5   L 5 5 5  5 5 5 5    Side Iliopsoas Quads Hamstring PF DF EHL  R 5 5 5 5 5 5   L 5 5 5 5 5 5    Reflexes are 1+ and symmetric at the biceps, brachioradialis, patella and achilles.   Hoffman's is absent.  Clonus is not present.   Bilateral upper and lower extremity sensation is intact to light touch.     Gait is slow.   Medical Decision Making  Imaging: No recent spinal imaging.   Assessment and Plan: Pain started after a fall a year and 1/2 ago. He has constant mid to lower back pain. No leg pain.  No numbness, tingling, or  weakness.   He states that he was diagnosed with compression fracture in his lower back after the fall.   No spinal imaging available.   Treatment options discussed with patient and following plan made:   - Xrays of thoracic and lumbar spine ordered.  - Will call him with results. Depending on these, may need further imaging such as MRI.   I spent a total of 25 minutes in face-to-face and non-face-to-face activities related to this patient's care today including review of outside records, review of imaging, review of symptoms, physical exam, discussion of differential diagnosis, discussion of treatment options, and documentation.   Thank you for involving me in the care of this patient.   [name] PA-C Dept. of Neurosurgery

## 2023-10-15 ENCOUNTER — Encounter: Payer: Self-pay | Admitting: Medical

## 2023-10-15 ENCOUNTER — Ambulatory Visit: Payer: Medicare HMO | Attending: Medical | Admitting: Medical

## 2023-10-15 ENCOUNTER — Encounter: Payer: Self-pay | Admitting: Orthopedic Surgery

## 2023-10-15 ENCOUNTER — Ambulatory Visit: Payer: Medicare HMO | Admitting: Orthopedic Surgery

## 2023-10-15 ENCOUNTER — Ambulatory Visit
Admission: RE | Admit: 2023-10-15 | Discharge: 2023-10-15 | Disposition: A | Discharge status: Home / Self Care | Payer: Medicare HMO | Source: Ambulatory Visit | Attending: Orthopedic Surgery | Admitting: Orthopedic Surgery

## 2023-10-15 ENCOUNTER — Telehealth: Payer: Self-pay

## 2023-10-15 ENCOUNTER — Other Ambulatory Visit: Payer: Self-pay

## 2023-10-15 VITALS — BP 120/70 | Ht 70.0 in | Wt 162.4 lb

## 2023-10-15 VITALS — BP 115/61 | HR 81 | Ht 70.0 in | Wt 166.8 lb

## 2023-10-15 DIAGNOSIS — R944 Abnormal results of kidney function studies: Secondary | ICD-10-CM

## 2023-10-15 DIAGNOSIS — M4856XA Collapsed vertebra, not elsewhere classified, lumbar region, initial encounter for fracture: Secondary | ICD-10-CM | POA: Diagnosis not present

## 2023-10-15 DIAGNOSIS — M549 Dorsalgia, unspecified: Secondary | ICD-10-CM | POA: Diagnosis not present

## 2023-10-15 DIAGNOSIS — W19XXXA Unspecified fall, initial encounter: Secondary | ICD-10-CM

## 2023-10-15 DIAGNOSIS — G8929 Other chronic pain: Secondary | ICD-10-CM | POA: Diagnosis not present

## 2023-10-15 DIAGNOSIS — M546 Pain in thoracic spine: Secondary | ICD-10-CM | POA: Insufficient documentation

## 2023-10-15 DIAGNOSIS — M4804 Spinal stenosis, thoracic region: Secondary | ICD-10-CM | POA: Diagnosis not present

## 2023-10-15 DIAGNOSIS — I350 Nonrheumatic aortic (valve) stenosis: Secondary | ICD-10-CM | POA: Diagnosis not present

## 2023-10-15 DIAGNOSIS — E782 Mixed hyperlipidemia: Secondary | ICD-10-CM

## 2023-10-15 DIAGNOSIS — M545 Low back pain, unspecified: Secondary | ICD-10-CM | POA: Diagnosis not present

## 2023-10-15 DIAGNOSIS — M47814 Spondylosis without myelopathy or radiculopathy, thoracic region: Secondary | ICD-10-CM | POA: Diagnosis not present

## 2023-10-15 DIAGNOSIS — M51369 Other intervertebral disc degeneration, lumbar region without mention of lumbar back pain or lower extremity pain: Secondary | ICD-10-CM | POA: Diagnosis not present

## 2023-10-15 DIAGNOSIS — M47816 Spondylosis without myelopathy or radiculopathy, lumbar region: Secondary | ICD-10-CM | POA: Diagnosis not present

## 2023-10-15 DIAGNOSIS — M5126 Other intervertebral disc displacement, lumbar region: Secondary | ICD-10-CM | POA: Diagnosis not present

## 2023-10-15 NOTE — Telephone Encounter (Signed)
Copied from CRM [id number]. Topic: General - Call Back - No Documentation >> Oct 15, 2023 10:40 AM Donald [name] wrote: Reason for CRM: Pt stated that he received a call from the office but missed it and would like a callback

## 2023-10-15 NOTE — Patient Instructions (Signed)
 It was so nice to see you today. Thank you so much for coming in.    I ordered xrays of your mid and lower back. You can get these at [facility] (building with the white pillars) off of [address]. The address is [address], [place], [place] [address]. You do not need any appointment.   It can take a 7-10 days for me to get the xray results back. I will call you with the results.   Depending on results, we may consider further imaging such as an MRI.   Please do not hesitate to call if you have any questions or concerns. You can also message me in MyChart.   [name] PA-C [phone]     The physicians and staff at [facility]surgery at [facility] are committed to providing excellent care. You may receive a survey asking for feedback about your experience at our office. We value you your feedback and appreciate you taking the time to to fill it out. The [facility] leadership team is also available to discuss your experience in person, feel free to contact us  [phone].

## 2023-10-15 NOTE — Patient Instructions (Signed)
Medication Instructions:  Your physician recommends that you continue on your current medications as directed. Please refer to the Current Medication list given to you today.   *If you need a refill on your cardiac medications before your next appointment, please call your pharmacy*   Lab Work: No labs ordered today    Testing/Procedures: Your physician has requested that you have an echocardiogram. Echocardiography is a painless test that uses sound waves to create images of your heart. It provides your doctor with information about the size and shape of your heart and how well your heart's chambers and valves are working.   You may receive an ultrasound enhancing agent through an IV if needed to better visualize your heart during the echo. This procedure takes approximately one hour.  There are no restrictions for this procedure.  This will take place at [address] [facility]) [address], [place] [address]  Please note: We ask at that you not bring children with you during ultrasound (echo/ vascular) testing. Due to room size and safety concerns, children are not allowed in the ultrasound rooms during exams. Our front office staff cannot provide observation of children in our lobby area while testing is being conducted. An adult accompanying a patient to their appointment will only be allowed in the ultrasound room at the discretion of the ultrasound technician under special circumstances. We apologize for any inconvenience.    Follow-Up: At [facility], you and your health needs are our priority.  As part of our continuing mission to provide you with exceptional heart care, we have created designated Provider Care Teams.  These Care Teams include your primary Cardiologist (physician) and Advanced Practice Providers (APPs -  Physician Assistants and Nurse Practitioners) who all work together to provide you with the care you need, when you need it.  We recommend  signing up for the patient portal called "MyChart".  Sign up information is provided on this After Visit Summary.  MyChart is used to connect with patients for Virtual Visits (Telemedicine).  Patients are able to view lab/test results, encounter notes, upcoming appointments, etc.  Non-urgent messages can be sent to your provider as well.   To learn more about what you can do with MyChart, go to [email or web address].    Your next appointment:   1 year(s)  Provider:   You may see Debbe Odea, MD or one of the following Advanced Practice Providers on your designated Care Team:   Nicolasa Ducking, NP Eula Listen, PA-C Cadence Fransico Michael, PA-C Charlsie Quest, NP Carlos Levering, NP

## 2023-10-15 NOTE — Progress Notes (Addendum)
 Cardiology Office Note:  .   Date:  10/15/2023  ID:  Donald [name], DOB [birth year], MRN [id number] PCP: Glendia Shad, MD  [facility] Providers Cardiologist:  Redell Cave, MD {  History of Present Illness: .   Donald [name] is a 78 y.o. male  HLD, AV stenosis who presents for echo follow-up.    Patient had an echocardiogram 2018 due to cardiac murmur.  EF 55 to 60%, mild stenosis noted in the aortic valve.   He was seen 02/19/22 as a New patient. He was stable from a cardiac perspective. Echo was repeated. Echo showed LVEF 50-55%, no WMA, G1DD, trivial MR, mod to severe AS with Aortic valve mean gradient , peak gradient ,  Vmax 3.06 m/s, DVI = 0.26, Aortic valve area 0.95.  Patient was last seen January 2024 and was overall stable from a cardiac perspective. Repeat echo showed LVEF 55-60%, no WMA, G1DD, mild MR, mod to severe AS, VTI 0.78cm2, mean gradient  Today, the patient is overall doing well. He denies chest pain, SOB, lightheadedness, dizziness, lower leg edema. He does very little activity due to back pain. He had a back injury 1.5 years ago and has residual issues, may need back surgery.    Studies Reviewed: .        Echo 03/2023 1. Left ventricular ejection fraction, by estimation, is 55 to 60%. Left  ventricular ejection fraction by 2D MOD biplane is 59.0 %. The left  ventricle has normal function. The left ventricle has no regional wall  motion abnormalities. Left ventricular  diastolic parameters are consistent with Grade I diastolic dysfunction  (impaired relaxation).   2. Right ventricular systolic function is normal. The right ventricular  size is normal.   3. The mitral valve is normal in structure. Mild mitral valve  regurgitation. No evidence of mitral stenosis.   4. The aortic valve is normal in structure. There is severe calcifcation  of the aortic valve. Aortic valve regurgitation is not visualized.  Moderate to severe aortic  valve stenosis. Aortic valve area, by VTI  measures 0.99 cm. Aortic valve mean gradient   measures 29.0 mmHg. Aortic valve Vmax measures 3.56 m/s.   5. The inferior vena cava is normal in size with greater than 50%  respiratory variability, suggesting right atrial pressure of 3 mmHg.   Comparison(s): Previous AV measurements: 46 mmHg peak, 28 mean.   Echo 09/2022  1. Left ventricular ejection fraction, by estimation, is 55 to 60%. The  left ventricle has normal function. The left ventricle has no regional  wall motion abnormalities. Left ventricular diastolic parameters are  consistent with Grade I diastolic  dysfunction (impaired relaxation). The average left ventricular global  longitudinal strain is -16.6 %.   2. Right ventricular systolic function is normal. The right ventricular  size is normal.   3. The mitral valve is normal in structure. Mild mitral valve  regurgitation.   4. Aortic valve DVI 0.25. peak gradient .. The aortic valve is  calcified. Aortic valve regurgitation is not visualized. Moderate to  severe aortic valve stenosis. Aortic valve area, by VTI measures 0.78 cm.  Aortic valve mean gradient measures 28.0   mmHg. Aortic valve Vmax measures 3.40 m/s.   5. The inferior vena cava is normal in size with greater than 50%  respiratory variability, suggesting right atrial pressure of 3 mmHg.   Comparison(s): 02/21/22- LVEF 50-55%, no WMA, G1DD, trivial MR, mod to  severe AS with Aortic valve  mean gradient , peak gradient , ,  Vmax 3.06  m/s, DVI = 0.26, Aortic valve area 0.95.   Echo 02/2022 1. Left ventricular ejection fraction, by estimation, is 50 to 55%. Left  ventricular ejection fraction by 2D MOD biplane is 50.9 %. The left  ventricle has low normal function. The left ventricle has no regional wall  motion abnormalities. Left  ventricular diastolic parameters are consistent with Grade I diastolic  dysfunction (impaired relaxation).   2.  Right ventricular systolic function is normal. The right ventricular  size is normal.   3. The mitral valve is normal in structure. Trivial mitral valve  regurgitation.   4. Aortic valve mean gradient , peak gradient , , Vmax 3.06  m/s, DVI = 0.26, Aortic valve area 0.95.SABRA The aortic valve is calcified.  Aortic valve regurgitation is not visualized. Moderate to severe aortic  valve stenosis.   5. The inferior vena cava is normal in size with greater than 50%  respiratory variability, suggesting right atrial pressure of 3 mmHg.   Comparison(s): Previous AV measurements on 03/01/17 were PG max and  PG mean.            Physical Exam:   VS:  BP 115/61 (BP Location: Left Arm, Patient Position: Sitting, Cuff Size: Normal)   Pulse 81   Ht 5' 10 (1.778 m)   Wt 166 lb 12.8 oz (75.7 kg)   SpO2 96%   BMI 23.93 kg/m    Wt Readings from Last 3 Encounters:  10/15/23 166 lb 12.8 oz (75.7 kg)  10/15/23 162 lb 6.4 oz (73.7 kg)  09/25/23 167 lb 3.2 oz (75.8 kg)    GEN: Well nourished, well developed in no acute distress NECK: No JVD; No carotid bruits CARDIAC: RRR, + murmur, no rubs, gallops RESPIRATORY:  Clear to auscultation without rales, wheezing or rhonchi  ABDOMEN: Soft, non-tender, non-distended EXTREMITIES:  No edema; No deformity   ASSESSMENT AND PLAN: .    Aortic valve stenosis Echo 2024 showed moderate to severe AS, VTI 0.99cm2, mean gradient , Vmax measures 3.99m/s. Repeat echo today. He denies any symptoms-no SOB, chest pain, lightheadedness, dizziness, LLE.   HLD LDL 46, HDL 39, TG 253, total chol 136. Continue Crestor  5mg  daily.     Dispo: Follow-up in 1 year.  Signed, [name], PA-C

## 2023-10-15 NOTE — Telephone Encounter (Signed)
 See result note.

## 2023-10-21 DIAGNOSIS — M9904 Segmental and somatic dysfunction of sacral region: Secondary | ICD-10-CM | POA: Diagnosis not present

## 2023-10-21 DIAGNOSIS — M9903 Segmental and somatic dysfunction of lumbar region: Secondary | ICD-10-CM | POA: Diagnosis not present

## 2023-10-21 DIAGNOSIS — M9901 Segmental and somatic dysfunction of cervical region: Secondary | ICD-10-CM | POA: Diagnosis not present

## 2023-10-21 DIAGNOSIS — M9905 Segmental and somatic dysfunction of pelvic region: Secondary | ICD-10-CM | POA: Diagnosis not present

## 2023-10-29 DIAGNOSIS — M9901 Segmental and somatic dysfunction of cervical region: Secondary | ICD-10-CM | POA: Diagnosis not present

## 2023-10-29 DIAGNOSIS — M9904 Segmental and somatic dysfunction of sacral region: Secondary | ICD-10-CM | POA: Diagnosis not present

## 2023-10-29 DIAGNOSIS — M9903 Segmental and somatic dysfunction of lumbar region: Secondary | ICD-10-CM | POA: Diagnosis not present

## 2023-10-29 DIAGNOSIS — M9905 Segmental and somatic dysfunction of pelvic region: Secondary | ICD-10-CM | POA: Diagnosis not present

## 2023-10-31 NOTE — Progress Notes (Unsigned)
   Telephone Visit- Progress Note: Referring Physician:  No referring provider defined for this encounter.  Primary Physician:  Dale [place], MD  This visit was performed via telephone.  Patient location: home Provider location: office  I spent a total of 10 minutes non-face-to-face activities for this visit on the date of this encounter including review of current clinical condition and response to treatment.    Patient has given verbal consent to this telephone visits and we reviewed the limitations of a telephone visit. Patient wishes to proceed.    Chief Complaint:  review xrays  History of Present Illness: Donald [name] is a 78 y.o. 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   He was told he had lumbar compression fracture after a fall a year and 1/2 ago. He has constant mid to lower back pain. No leg pain. Pain is worse with standing. Pain resolves when he stops to sit. No numbness, tingling, or weakness.    Bowel/Bladder Dysfunction: none   Conservative measures:  Physical therapy: has not participated in, has been seeing chiropractor with some relief.  Multimodal medical therapy including regular antiinflammatories: tylenol  Injections:  no epidural steroid injections   Past Surgery: no spinal surgeries    Exam: No exam done as this was a telephone encounter.     Imaging: Thoracic xrays dated 10/15/23:  FINDINGS: Multilevel degenerative changes with disc space narrowing and marginal osteophytes. No osteolytic or osteoblastic changes. Normal alignment.   IMPRESSION: Degenerative changes.     Electronically Signed   By: Layla Maw M.D.   On: 10/27/2023 14:00   Lumbar xrays dated 10/15/23:  FINDINGS: Compression fracture of the L1 is new since the prior CT from 2015. Degenerative disc disease with marginal osteophytes and disc space narrowing L3 through S1. No  motion with flexion and extension. Grade 1 L5 retrolisthesis. No osteolytic or osteoblastic lesions. Aortoiliac atheromatous calcifications.   IMPRESSION: 1. L1 compression fracture, new since 2015. 2. Degenerative changes.     Electronically Signed   By: Layla Maw M.D.   On: 10/27/2023 13:57   I have personally reviewed the images and agree with the above interpretation.  Assessment and Plan: Pain started after a fall a year and 1/2 ago- he was told he had lumbar compression fracture.   He has constant mid to lower back pain. No leg pain. Pain is worse with standing. Pain resolves when he stops to sit. No numbness, tingling, or weakness.   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    Treatment options discussed with patient and following plan made:   - MRI of lumbar spine to further evaluate LBP and check acuity of L1 compression fracture. No improvement with time or medications.  - Discussed PT for TL spine. He declines for now.  - Depending on results of lumbar MRI, may consider injections.  - He will follow up with me in clinic to review MRI results once I have them back.   Drake Leach PA-C Neurosurgery

## 2023-11-01 ENCOUNTER — Encounter: Payer: Self-pay | Admitting: Orthopedic Surgery

## 2023-11-01 ENCOUNTER — Ambulatory Visit: Payer: Medicare HMO | Admitting: Orthopedic Surgery

## 2023-11-01 DIAGNOSIS — M47814 Spondylosis without myelopathy or radiculopathy, thoracic region: Secondary | ICD-10-CM | POA: Diagnosis not present

## 2023-11-01 DIAGNOSIS — S32010S Wedge compression fracture of first lumbar vertebra, sequela: Secondary | ICD-10-CM | POA: Diagnosis not present

## 2023-11-01 DIAGNOSIS — M4310 Spondylolisthesis, site unspecified: Secondary | ICD-10-CM | POA: Diagnosis not present

## 2023-11-01 DIAGNOSIS — M5136 Other intervertebral disc degeneration, lumbar region with discogenic back pain only: Secondary | ICD-10-CM | POA: Diagnosis not present

## 2023-11-01 DIAGNOSIS — M47816 Spondylosis without myelopathy or radiculopathy, lumbar region: Secondary | ICD-10-CM | POA: Diagnosis not present

## 2023-11-01 DIAGNOSIS — S32010A Wedge compression fracture of first lumbar vertebra, initial encounter for closed fracture: Secondary | ICD-10-CM

## 2023-11-04 ENCOUNTER — Ambulatory Visit: Payer: Medicare HMO | Attending: Medical

## 2023-11-04 ENCOUNTER — Other Ambulatory Visit (INDEPENDENT_AMBULATORY_CARE_PROVIDER_SITE_OTHER): Payer: Medicare HMO

## 2023-11-04 ENCOUNTER — Other Ambulatory Visit: Payer: Medicare HMO

## 2023-11-04 DIAGNOSIS — I350 Nonrheumatic aortic (valve) stenosis: Secondary | ICD-10-CM | POA: Diagnosis not present

## 2023-11-04 DIAGNOSIS — R944 Abnormal results of kidney function studies: Secondary | ICD-10-CM | POA: Diagnosis not present

## 2023-11-04 LAB — ECHOCARDIOGRAM COMPLETE
AR max vel: 0.66 cm2
AV Area VTI: 0.62 cm2
AV Area mean vel: 0.59 cm2
AV Mean grad: 32 mm[Hg]
AV Peak grad: 53.6 mm[Hg]
Ao pk vel: 3.66 m/s
Area-P 1/2: 2.76 cm2
S' Lateral: 2.6 cm

## 2023-11-05 LAB — BASIC METABOLIC PANEL
BUN: 33 mg/dL — ABNORMAL HIGH (ref 6–23)
CO2: 24 meq/L (ref 19–32)
Calcium: 9.3 mg/dL (ref 8.4–10.5)
Chloride: 106 meq/L (ref 96–112)
Creatinine, Ser: 1.67 mg/dL — ABNORMAL HIGH (ref 0.40–1.50)
GFR: 39.72 mL/min — ABNORMAL LOW (ref >60.00)
Glucose, Bld: 182 mg/dL — ABNORMAL HIGH (ref 70–99)
Potassium: 4.6 meq/L (ref 3.5–5.1)
Sodium: 142 meq/L (ref 135–145)

## 2023-11-05 LAB — HM DIABETES EYE EXAM

## 2023-11-06 DIAGNOSIS — M9901 Segmental and somatic dysfunction of cervical region: Secondary | ICD-10-CM | POA: Diagnosis not present

## 2023-11-06 DIAGNOSIS — M9905 Segmental and somatic dysfunction of pelvic region: Secondary | ICD-10-CM | POA: Diagnosis not present

## 2023-11-06 DIAGNOSIS — M9903 Segmental and somatic dysfunction of lumbar region: Secondary | ICD-10-CM | POA: Diagnosis not present

## 2023-11-06 DIAGNOSIS — M9904 Segmental and somatic dysfunction of sacral region: Secondary | ICD-10-CM | POA: Diagnosis not present

## 2023-11-07 ENCOUNTER — Telehealth: Payer: Self-pay

## 2023-11-07 ENCOUNTER — Other Ambulatory Visit: Payer: Self-pay | Admitting: Internal Medicine

## 2023-11-07 DIAGNOSIS — R944 Abnormal results of kidney function studies: Secondary | ICD-10-CM

## 2023-11-07 DIAGNOSIS — I350 Nonrheumatic aortic (valve) stenosis: Secondary | ICD-10-CM

## 2023-11-07 NOTE — Telephone Encounter (Signed)
 The patient has been notified of the result along with recommendations. Pt verbalized understanding. All questions (if any) were answered   TEE scheduled for 11/14/23 at 7:30 am with Dr. Mariah Milling CBC order placed  BMP completed on 11/04/23 Referral placed for structural valve team  The instructions were discussed via phone and also sent through MyChart for review.   Cadence David Stall, PA-C 11/07/2023 12:19 PM EST     We should also set him up for a TEE to further evaluate the aortic valve, please.   Cadence David Stall, PA-C 11/07/2023 12:18 PM EST     Echo showed normal pump function, impaired relaxation, Severe aortic valve stenosis. Lets refer to the structural valve team in [place].

## 2023-11-07 NOTE — Progress Notes (Signed)
 Order placed for renal ultrasound.

## 2023-11-11 ENCOUNTER — Other Ambulatory Visit: Payer: Medicare HMO

## 2023-11-11 ENCOUNTER — Telehealth: Payer: Self-pay | Admitting: Internal Medicine

## 2023-11-11 DIAGNOSIS — R944 Abnormal results of kidney function studies: Secondary | ICD-10-CM

## 2023-11-11 NOTE — Addendum Note (Signed)
 Addended by: Marianne Sofia on: 11/11/2023 09:00 AM   Modules accepted: Orders

## 2023-11-11 NOTE — Telephone Encounter (Signed)
 Patient need lab orders.

## 2023-11-11 NOTE — Telephone Encounter (Signed)
Order placed for f/u lab.   

## 2023-11-12 ENCOUNTER — Other Ambulatory Visit: Payer: Self-pay | Admitting: Cardiovascular Disease

## 2023-11-12 ENCOUNTER — Telehealth: Payer: Self-pay

## 2023-11-12 ENCOUNTER — Ambulatory Visit
Admission: RE | Admit: 2023-11-12 | Discharge: 2023-11-12 | Disposition: A | Discharge status: Home / Self Care | Payer: Medicare HMO | Source: Ambulatory Visit | Attending: Internal Medicine | Admitting: Internal Medicine

## 2023-11-12 DIAGNOSIS — R944 Abnormal results of kidney function studies: Secondary | ICD-10-CM | POA: Insufficient documentation

## 2023-11-12 DIAGNOSIS — M9903 Segmental and somatic dysfunction of lumbar region: Secondary | ICD-10-CM | POA: Diagnosis not present

## 2023-11-12 DIAGNOSIS — M9904 Segmental and somatic dysfunction of sacral region: Secondary | ICD-10-CM | POA: Diagnosis not present

## 2023-11-12 DIAGNOSIS — M9905 Segmental and somatic dysfunction of pelvic region: Secondary | ICD-10-CM | POA: Diagnosis not present

## 2023-11-12 DIAGNOSIS — M9901 Segmental and somatic dysfunction of cervical region: Secondary | ICD-10-CM | POA: Diagnosis not present

## 2023-11-12 NOTE — Telephone Encounter (Signed)
 Per Cadence, Dr. Mariah Milling recommended canceling the TEE scheduled for 11/14/23 and reported that the structural heart team will perform the TEE instead.  The patient was made aware, and the TEE for 11/14/23 has been canceled.

## 2023-11-13 ENCOUNTER — Other Ambulatory Visit (INDEPENDENT_AMBULATORY_CARE_PROVIDER_SITE_OTHER): Payer: Medicare HMO

## 2023-11-13 DIAGNOSIS — R944 Abnormal results of kidney function studies: Secondary | ICD-10-CM | POA: Diagnosis not present

## 2023-11-13 LAB — BASIC METABOLIC PANEL
BUN: 22 mg/dL (ref 6–23)
CO2: 29 meq/L (ref 19–32)
Calcium: 9.6 mg/dL (ref 8.4–10.5)
Chloride: 104 meq/L (ref 96–112)
Creatinine, Ser: 1.12 mg/dL (ref 0.40–1.50)
GFR: 64.14 mL/min (ref >60.00)
Glucose, Bld: 118 mg/dL — ABNORMAL HIGH (ref 70–99)
Potassium: 5.1 meq/L (ref 3.5–5.1)
Sodium: 139 meq/L (ref 135–145)

## 2023-11-14 ENCOUNTER — Encounter: Admission: RE | Payer: Self-pay | Source: Home / Self Care

## 2023-11-14 ENCOUNTER — Encounter (HOSPITAL_BASED_OUTPATIENT_CLINIC_OR_DEPARTMENT_OTHER): Payer: Self-pay

## 2023-11-14 ENCOUNTER — Ambulatory Visit: Admission: RE | Admit: 2023-11-14 | Payer: Medicare HMO | Source: Home / Self Care | Admitting: Cardiovascular Disease

## 2023-11-14 SURGERY — ECHOCARDIOGRAM, TRANSESOPHAGEAL
Anesthesia: General

## 2023-11-15 ENCOUNTER — Other Ambulatory Visit: Payer: Self-pay

## 2023-11-15 ENCOUNTER — Encounter: Payer: Self-pay | Admitting: Cardiovascular Disease

## 2023-11-15 ENCOUNTER — Ambulatory Visit: Attending: Cardiovascular Disease | Admitting: Cardiovascular Disease

## 2023-11-15 VITALS — BP 120/62 | HR 72 | Ht 70.0 in | Wt 167.8 lb

## 2023-11-15 DIAGNOSIS — I350 Nonrheumatic aortic (valve) stenosis: Secondary | ICD-10-CM | POA: Diagnosis not present

## 2023-11-15 DIAGNOSIS — E875 Hyperkalemia: Secondary | ICD-10-CM

## 2023-11-15 NOTE — Patient Instructions (Signed)
 Medication Instructions:  Your physician recommends that you continue on your current medications as directed. Please refer to the Current Medication list given to you today.  *If you need a refill on your cardiac medications before your next appointment, please call your pharmacy*  Lab Work: None ordered today. If you have labs (blood work) drawn today and your tests are completely normal, you will receive your results only by: MyChart Message (if you have MyChart) OR A paper copy in the mail If you have any lab test that is abnormal or we need to change your treatment, we will call you to review the results.  Testing/Procedures: Your physician has requested that you have an echocardiogram prior to your 6 month follow-up visit. Echocardiography is a painless test that uses sound waves to create images of your heart. It provides your doctor with information about the size and shape of your heart and how well your heart's chambers and valves are working. This procedure takes approximately one hour. There are no restrictions for this procedure. Please do NOT wear cologne, perfume, aftershave, or lotions (deodorant is allowed). Please arrive 15 minutes prior to your appointment time.  Please note: We ask at that you not bring children with you during ultrasound (echo/ vascular) testing. Due to room size and safety concerns, children are not allowed in the ultrasound rooms during exams. Our front office staff cannot provide observation of children in our lobby area while testing is being conducted. An adult accompanying a patient to their appointment will only be allowed in the ultrasound room at the discretion of the ultrasound technician under special circumstances. We apologize for any inconvenience.   Follow-Up: At [facility], you and your health needs are our priority.  As part of our continuing mission to provide you with exceptional heart care, we have created designated Provider Care  Teams.  These Care Teams include your primary Cardiologist (physician) and Advanced Practice Providers (APPs -  Physician Assistants and Nurse Practitioners) who all work together to provide you with the care you need, when you need it.  Your next appointment:   6 month(s) after echocardiogram is completed   The format for your next appointment:   In Person  Provider:   Verne Carrow, MD{

## 2023-11-15 NOTE — Progress Notes (Signed)
 [facility] Consult Note  Chief Complaint  Patient presents with   New Patient (Initial Visit)    Aortic stenosis   History of Present Illness: 78 yo male with history of hyperlipidemia, DM2, kidney stones, rheumatoid arthritis and aortic stenosis who is here today as a new consult, referred by Dr. Azucena Cecil, for further discussion regarding his aortic stenosis and possible TAVR. He has been followed by Dr. Azucena Cecil for aortic stenosis which was moderate by echo in 2023 and 2024. He was seen in our [facility] office on 10/15/23 and only c/o back pain due to an injury. Echo 11/04/23 with LVEF=60-65%. Mild mitral regurgitation. Severe aortic valve stenosis with mean gradient 32 mmHg, AVA 0.59 cm2, DI 0.20. SVI 26.   He tells me today that he feels well without any issues except for back pain. He has no chest pain, dyspnea, palpitations, LE edema, dizziness. He lives in [name] with his wife. He is a retired Curator. He has no active dental issues.   Primary Care Physician: Dale Bladensburg, MD Primary Cardiologist: Agbor-Etang Referring Cardiologist: Azucena Cecil  Past Medical History:  Diagnosis Date   Aortic stenosis    Carpal tunnel syndrome    Diabetes mellitus without complication (HCC)    Hyperlipidemia    Nephrolithiasis    H/O stones   Rheumatoid arthritis (HCC)     Past Surgical History:  Procedure Laterality Date   APPENDECTOMY  1962   CARPAL TUNNEL RELEASE Bilateral    hands    Current Outpatient Medications  Medication Sig Dispense Refill   ACCU-CHEK AVIVA PLUS test strip TEST TWO TIMES DAILY 200 strip 2   ACCU-CHEK SOFTCLIX LANCETS lancets TEST TWO TIMES DAILY 200 each 0   APPLE CIDER VINEGAR PO Take 1 tablet by mouth daily.     Calcium Carb-Cholecalciferol (CALCIUM + VITAMIN D3 PO) Take 2 each by mouth daily.     cetirizine (ZYRTEC) 10 MG tablet Take 10 mg by mouth daily as needed for allergies.     Coenzyme Q10 (COQ10) 100 MG  CAPS Take 100 mg by mouth daily.     cyanocobalamin (VITAMIN B12) 1000 MCG tablet Take 1,000 mcg by mouth daily.     L-Lysine 500 MG CAPS Take 500 mg by mouth daily.     leflunomide (ARAVA) 10 MG tablet Take 10 mg by mouth daily.      magnesium oxide (MAG-OX) 400 (240 Mg) MG tablet Take 400 mg by mouth daily.     Misc Natural Products (BEET ROOT PO) Take 1 capsule by mouth daily.     Multiple Vitamin (MULTI-VITAMIN) tablet Take 1 tablet by mouth daily.     rivastigmine (EXELON) 1.5 MG capsule Take 1.5 mg by mouth 2 (two) times daily.     rosuvastatin (CRESTOR) 5 MG tablet TAKE 1 TABLET EVERY DAY 90 tablet 10   No current facility-administered medications for this visit.    No Known Allergies  Social History   Socioeconomic History   Marital status: Married    Spouse name: Not on file   Number of children: 3   Years of education: Not on file   Highest education level: Not on file  Occupational History   Occupation: Retired-Mechanic  Tobacco Use   Smoking status: Former    Types: Cigarettes   Smokeless tobacco: Never  Vaping Use   Vaping status: Never Used  Substance and Sexual Activity   Alcohol use: No    Alcohol/week: 0.0 standard drinks of alcohol  Drug use: No   Sexual activity: Not Currently  Other Topics Concern   Not on file  Social History Narrative   Married   Social Drivers of Health   Financial Resource Strain: Low Risk  (08/20/2023)   Overall Financial Resource Strain (CARDIA)    Difficulty of Paying Living Expenses: Not hard at all  Food Insecurity: No Food Insecurity (08/20/2023)   Hunger Vital Sign    Worried About Running Out of Food in the Last Year: Never true    Ran Out of Food in the Last Year: Never true  Transportation Needs: No Transportation Needs (08/20/2023)   PRAPARE - Administrator, Civil Service (Medical): No    Lack of Transportation (Non-Medical): No  Physical Activity: Inactive (08/20/2023)   Exercise Vital Sign     Days of Exercise per Week: 0 days    Minutes of Exercise per Session: 0 min  Stress: No Stress Concern Present (08/20/2023)   [employer] of Occupational Health - Occupational Stress Questionnaire    Feeling of Stress : Not at all  Social Connections: Moderately Integrated (08/20/2023)   Social Connection and Isolation Panel [NHANES]    Frequency of Communication with Friends and Family: More than three times a week    Frequency of Social Gatherings with Friends and Family: Three times a week    Attends Religious Services: More than 4 times per year    Active Member of Clubs or Organizations: No    Attends Banker Meetings: Never    Marital Status: Married  Catering manager Violence: Not At Risk (08/20/2023)   Humiliation, Afraid, Rape, and Kick questionnaire    Fear of Current or Ex-Partner: No    Emotionally Abused: No    Physically Abused: No    Sexually Abused: No    Family History  Problem Relation Age of Onset   Heart disease Father        Heart attack-54   Hypertension Father    Diabetes Maternal Grandmother     Review of Systems:  As stated in the HPI and otherwise negative.   BP 120/62   Pulse 72   Ht 5\' 10"  (1.778 m)   Wt 76.1 kg   SpO2 98%   BMI 24.08 kg/m   Physical Examination: General: Well developed, well nourished, NAD  HEENT: OP clear, mucus membranes moist  SKIN: warm, dry. No rashes. Neuro: No focal deficits  Musculoskeletal: Muscle strength 5/5 all ext  Psychiatric: Mood and affect normal  Neck: No JVD, no carotid bruits, no thyromegaly, no lymphadenopathy.  Lungs:Clear bilaterally, no wheezes, rhonci, crackles Cardiovascular: Regular rate and rhythm. Loud, harsh, late peaking systolic murmur.  Abdomen:Soft. Bowel sounds present. Non-tender.  Extremities: No lower extremity edema. Pulses are 2 + in the bilateral DP/PT.  EKG:  EKG is ordered today. The ekg ordered today demonstrates  EKG Interpretation Date/Time:  Friday  November 15 2023 13:37:58 EST Ventricular Rate:  72 PR Interval:  168 QRS Duration:  72 QT Interval:  402 QTC Calculation: 440 R Axis:   70  Text Interpretation: Normal sinus rhythm Inferior T wave abnormality Confirmed by Verne Carrow [phone]) on 11/15/2023 2:08:53 PM    Echo 11/04/23:  1. Left ventricular ejection fraction, by estimation, is 60 to 65%. The  left ventricle has normal function. The left ventricle has no regional  wall motion abnormalities. Left ventricular diastolic parameters are  consistent with Grade I diastolic  dysfunction (impaired relaxation). The average left ventricular  global  longitudinal strain is -17.8 %. The global longitudinal strain is normal.   2. Right ventricular systolic function is normal. The right ventricular  size is normal.   3. The mitral valve is normal in structure. Mild mitral valve  regurgitation.   4. Aortic valve DVI=0.2. The aortic valve is calcified. Aortic valve  regurgitation is mild. Severe aortic valve stenosis. Aortic valve area, by  VTI measures 0.62 cm. Aortic valve mean gradient measures 32.0 mmHg.  Aortic valve Vmax measures 3.66 m/s.   5. The inferior vena cava is normal in size with greater than 50%  respiratory variability, suggesting right atrial pressure of 3 mmHg.   Comparison(s): Gradients have increased from prior exan 04/02/23.   FINDINGS   Left Ventricle: Left ventricular ejection fraction, by estimation, is 60  to 65%. The left ventricle has normal function. The left ventricle has no  regional wall motion abnormalities. The average left ventricular global  longitudinal strain is -17.8 %.  Strain was performed and the global longitudinal strain is normal. The  left ventricular internal cavity size was normal in size. There is no left  ventricular hypertrophy. Left ventricular diastolic parameters are  consistent with Grade I diastolic  dysfunction (impaired relaxation).   Right Ventricle: The right  ventricular size is normal. No increase in  right ventricular wall thickness. Right ventricular systolic function is  normal.   Left Atrium: Left atrial size was normal in size.   Right Atrium: Right atrial size was normal in size.   Pericardium: There is no evidence of pericardial effusion.   Mitral Valve: The mitral valve is normal in structure. Mild mitral valve  regurgitation.   Tricuspid Valve: The tricuspid valve is normal in structure. Tricuspid  valve regurgitation is trivial.   Aortic Valve: Aortic valve DVI=0.2. The aortic valve is calcified. Aortic  valve regurgitation is mild. Severe aortic stenosis is present. Aortic  valve mean gradient measures 32.0 mmHg. Aortic valve peak gradient  measures 53.6 mmHg. Aortic valve area, by  VTI measures 0.62 cm.   Pulmonic Valve: The pulmonic valve was not well visualized. Pulmonic valve  regurgitation is not visualized.   Aorta: The aortic root is normal in size and structure.   Venous: The inferior vena cava is normal in size with greater than 50%  respiratory variability, suggesting right atrial pressure of 3 mmHg.   IAS/Shunts: No atrial level shunt detected by color flow Doppler.   Additional Comments: 3D imaging was not performed.     LEFT VENTRICLE  PLAX 2D  LVIDd:         4.30 cm   Diastology  LVIDs:         2.60 cm   LV e' medial:    4.57 cm/s  LV PW:         0.90 cm   LV E/e' medial:  13.4  LV IVS:        0.80 cm   LV e' lateral:   5.44 cm/s  LVOT diam:     2.00 cm   LV E/e' lateral: 11.3  LV SV:         51  LV SV Index:   26        2D Longitudinal Strain  LVOT Area:     3.14 cm  2D Strain GLS Avg:     -17.8 %     RIGHT VENTRICLE  RV Basal diam:  2.30 cm  RV Mid diam:    2.00 cm  RV S prime:     13.40 cm/s  TAPSE (M-mode): 2.1 cm   LEFT ATRIUM             Index        RIGHT ATRIUM           Index  LA diam:        4.00 cm 2.07 cm/m   RA Area:     12.30 cm  LA Vol (A2C):   45.1 ml 23.34 ml/m  RA  Volume:   25.70 ml  13.30 ml/m  LA Vol (A4C):   39.6 ml 20.49 ml/m  LA Biplane Vol: 42.1 ml 21.79 ml/m   AORTIC VALVE  AV Area (Vmax):    0.66 cm  AV Area (Vmean):   0.59 cm  AV Area (VTI):     0.62 cm  AV Vmax:           366.00 cm/s  AV Vmean:          267.000 cm/s  AV VTI:            0.824 m  AV Peak Grad:      53.6 mmHg  AV Mean Grad:      32.0 mmHg  LVOT Vmax:         76.50 cm/s  LVOT Vmean:        50.300 cm/s  LVOT VTI:          0.162 m  LVOT/AV VTI ratio: 0.20    AORTA  Ao Root diam: 2.90 cm   MITRAL VALVE                TRICUSPID VALVE  MV Area (PHT): 2.76 cm     TR Peak grad:   19.0 mmHg  MV Decel Time: 275 msec     TR Vmax:        218.00 cm/s  MV E velocity: 61.30 cm/s  MV A velocity: 133.00 cm/s  SHUNTS  MV E/A ratio:  0.46         Systemic VTI:  0.16 m                              Systemic Diam: 2.00 cm   Recent Labs: 09/25/2023: ALT 17; Hemoglobin 13.4; Platelets 183.0; TSH 0.88 11/13/2023: BUN 22; Creatinine, Ser 1.12; Potassium 5.1; Sodium 139    Wt Readings from Last 3 Encounters:  11/15/23 76.1 kg  10/15/23 75.7 kg  10/15/23 73.7 kg    Assessment and Plan:   1. Severe Aortic Valve Stenosis: He has severe aortic valve stenosis. NYHA class 1. I have personally reviewed the echo images. The aortic valve is thickened and calcified with limited leaflet mobility. He is asymptomatic. I think he would benefit from AVR when he becomes symptomatic or if there is any change in his LV function. He would be a candidate for surgical AVR or TAVR.   I have reviewed the natural history of aortic stenosis with the patient and their family members  who are present today. We have discussed the limitations of medical therapy and the poor prognosis associated with symptomatic aortic stenosis. We have reviewed potential treatment options, including palliative medical therapy, conventional surgical aortic valve replacement, and transcatheter aortic valve replacement. We discussed  treatment options in the context of the patient's specific comorbid medical conditions.   I will plan to repeat his echo in 6 months.  I will see  him back here in 6 months.     Labs/ tests ordered today include:   Orders Placed This Encounter  Procedures   EKG 12-Lead   ECHOCARDIOGRAM COMPLETE   Disposition:   F/U with me in 6 months.   Signed, Verne Carrow, MD, [facility] 11/15/2023 2:10 PM    [facility] [address] [place], [place], [name]  [address] Phone: [phone]; Fax: [phone]

## 2023-11-16 ENCOUNTER — Ambulatory Visit
Admission: RE | Admit: 2023-11-16 | Discharge: 2023-11-16 | Disposition: A | Discharge status: Home / Self Care | Source: Ambulatory Visit | Attending: Orthopedic Surgery | Admitting: Orthopedic Surgery

## 2023-11-16 DIAGNOSIS — S32010A Wedge compression fracture of first lumbar vertebra, initial encounter for closed fracture: Secondary | ICD-10-CM | POA: Diagnosis not present

## 2023-11-16 DIAGNOSIS — M47816 Spondylosis without myelopathy or radiculopathy, lumbar region: Secondary | ICD-10-CM

## 2023-11-16 DIAGNOSIS — M48061 Spinal stenosis, lumbar region without neurogenic claudication: Secondary | ICD-10-CM | POA: Diagnosis not present

## 2023-11-16 DIAGNOSIS — M5136 Other intervertebral disc degeneration, lumbar region with discogenic back pain only: Secondary | ICD-10-CM | POA: Diagnosis not present

## 2023-11-16 DIAGNOSIS — M4310 Spondylolisthesis, site unspecified: Secondary | ICD-10-CM

## 2023-11-16 DIAGNOSIS — M47814 Spondylosis without myelopathy or radiculopathy, thoracic region: Secondary | ICD-10-CM

## 2023-11-19 DIAGNOSIS — M9903 Segmental and somatic dysfunction of lumbar region: Secondary | ICD-10-CM | POA: Diagnosis not present

## 2023-11-19 DIAGNOSIS — M9904 Segmental and somatic dysfunction of sacral region: Secondary | ICD-10-CM | POA: Diagnosis not present

## 2023-11-19 DIAGNOSIS — M9905 Segmental and somatic dysfunction of pelvic region: Secondary | ICD-10-CM | POA: Diagnosis not present

## 2023-11-19 DIAGNOSIS — M9901 Segmental and somatic dysfunction of cervical region: Secondary | ICD-10-CM | POA: Diagnosis not present

## 2023-11-26 DIAGNOSIS — M9904 Segmental and somatic dysfunction of sacral region: Secondary | ICD-10-CM | POA: Diagnosis not present

## 2023-11-26 DIAGNOSIS — M9905 Segmental and somatic dysfunction of pelvic region: Secondary | ICD-10-CM | POA: Diagnosis not present

## 2023-11-26 DIAGNOSIS — M9901 Segmental and somatic dysfunction of cervical region: Secondary | ICD-10-CM | POA: Diagnosis not present

## 2023-11-26 DIAGNOSIS — M9903 Segmental and somatic dysfunction of lumbar region: Secondary | ICD-10-CM | POA: Diagnosis not present

## 2023-12-03 DIAGNOSIS — M9901 Segmental and somatic dysfunction of cervical region: Secondary | ICD-10-CM | POA: Diagnosis not present

## 2023-12-03 DIAGNOSIS — M9904 Segmental and somatic dysfunction of sacral region: Secondary | ICD-10-CM | POA: Diagnosis not present

## 2023-12-03 DIAGNOSIS — M9903 Segmental and somatic dysfunction of lumbar region: Secondary | ICD-10-CM | POA: Diagnosis not present

## 2023-12-03 DIAGNOSIS — M9905 Segmental and somatic dysfunction of pelvic region: Secondary | ICD-10-CM | POA: Diagnosis not present

## 2023-12-09 NOTE — Progress Notes (Unsigned)
 Referring Physician:  Dale Addington, MD [address] [place],  [place] [address]  Primary Physician:  Dale Alton, MD  History of Present Illness: 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   Last did phone visit with me on 11/01/23. He has known  thoracic spondylosis, L1 compression fracture that is likely old, lumbar spondylosis and DDD with retrolisthesis L5-S1.   MRI was ordered and he is here to review it.   His symptoms are the same. He continues with constant mid to lower back pain. No leg pain. Pain is worse with standing. Pain resolves when he stops to sit. No numbness, tingling, or weakness.    Bowel/Bladder Dysfunction: none   Conservative measures:  Physical therapy: has not participated in, has been seeing chiropractor with some relief.  Multimodal medical therapy including regular antiinflammatories: tylenol  Injections:  no epidural steroid injections   Past Surgery: no spinal surgeries   Review of Systems:  A 10 point review of systems is negative, except for the pertinent positives and negatives detailed in the HPI.  Past Medical History: Past Medical History:  Diagnosis Date   Aortic stenosis    Carpal tunnel syndrome    Diabetes mellitus without complication (HCC)    Hyperlipidemia    Nephrolithiasis    H/O stones   Rheumatoid arthritis (HCC)     Past Surgical History: Past Surgical History:  Procedure Laterality Date   APPENDECTOMY  1962   CARPAL TUNNEL RELEASE Bilateral    hands    Allergies: Allergies as of 12/10/2023   (No Known Allergies)    Medications: Outpatient Encounter Medications as of 12/10/2023  Medication Sig   ACCU-CHEK AVIVA PLUS test strip TEST TWO TIMES DAILY   ACCU-CHEK SOFTCLIX LANCETS lancets TEST TWO TIMES DAILY   APPLE CIDER VINEGAR PO Take 1 tablet by mouth daily.   Calcium Carb-Cholecalciferol (CALCIUM +  VITAMIN D3 PO) Take 2 each by mouth daily.   cetirizine (ZYRTEC) 10 MG tablet Take 10 mg by mouth daily as needed for allergies.   Coenzyme Q10 (COQ10) 100 MG CAPS Take 100 mg by mouth daily.   cyanocobalamin (VITAMIN B12) 1000 MCG tablet Take 1,000 mcg by mouth daily.   L-Lysine 500 MG CAPS Take 500 mg by mouth daily.   leflunomide (ARAVA) 10 MG tablet Take 10 mg by mouth daily.    magnesium oxide (MAG-OX) 400 (240 Mg) MG tablet Take 400 mg by mouth daily.   Misc Natural Products (BEET ROOT PO) Take 1 capsule by mouth daily.   Multiple Vitamin (MULTI-VITAMIN) tablet Take 1 tablet by mouth daily.   rivastigmine (EXELON) 1.5 MG capsule Take 1.5 mg by mouth 2 (two) times daily.   rosuvastatin (CRESTOR) 5 MG tablet TAKE 1 TABLET EVERY DAY   No facility-administered encounter medications on file as of 12/10/2023.    Social History: Social History   Tobacco Use   Smoking status: Former    Types: Cigarettes   Smokeless tobacco: Never  Vaping Use   Vaping status: Never Used  Substance Use Topics   Alcohol use: No    Alcohol/week: 0.0 standard drinks of alcohol   Drug use: No    Family Medical History: Family History  Problem Relation Age of Onset   Heart disease Father        Heart attack-54   Hypertension Father    Diabetes Maternal Grandmother  Physical Examination: There were no vitals filed for this visit.    Awake, alert, oriented to person, place, and time.  Speech is clear and fluent. Fund of knowledge is appropriate.   Cranial Nerves: Pupils equal round and reactive to light.  Facial tone is symmetric.    No thoracic or lumbar tenderness.   No abnormal lesions on exposed skin.   Strength: Side Iliopsoas Quads Hamstring PF DF EHL  R 5 5 5 5 5 5   L 5 5 5 5 5 5    Reflexes are 1+ and symmetric at the patella and achilles.    Clonus is not present.   Bilateral lower extremity sensation is intact to light touch.     Gait is slow.   Medical Decision  Making  Imaging: Lumbar MRI dated 11/16/23:  FINDINGS: Segmentation: Standard.   Alignment:  Grade 1 anterolisthesis of L4 on L5.   Vertebrae: Chronic wedge deformity at L1. Degenerative endplate marrow changes at a few levels.   Conus medullaris and cauda equina: The conus medullaris terminates at the level of L1-L2. The distal spinal cord signal intensity is normal.   Paraspinal and other soft tissues: The visualized abdomen and pelvis show no soft tissue abnormality. The visualized aorta is normal.   Disc levels:   L1-L2: Mild disc bulge. Moderate bilateral facet arthropathy. Mild bilateral neuroforaminal stenosis. Mild spinal canal stenosis.   L2-L3: Disc bulge. Moderate bilateral facet arthropathy. Mild bilateral neuroforaminal stenosis. Mild spinal canal stenosis.   L3-L4: Disc bulge. Moderate bilateral facet arthropathy. Moderate bilateral neuroforaminal stenosis. Mild spinal canal stenosis.   L4-L5: Disc bulge. Moderate bilateral facet arthropathy. Moderate bilateral neuroforaminal stenosis. Mild spinal canal stenosis.   L5-S1: Disc bulge. Moderate bilateral facet arthropathy. Moderate bilateral neuroforaminal stenosis. No spinal canal stenosis.   IMPRESSION: 1. 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  No acute compression fracture. 2. Mild canal stenosis at several levels secondary to disc bulging and facet arthropathy. 3. Moderate foraminal stenosis L3-L4, L4-L5, and L5-S1 secondary to disc bulging and facet arthropathy.     Electronically Signed   By: Baldemar Friday M.D.   On: 12/06/2023 13:49  I have personally reviewed the images and agree with the above interpretation. I do not appreciate much central stenosis at L1-L3.    Assessment and Plan: Pain started after a fall a year and 1/2 ago.   He continues with constant mid to lower back pain. He has pain from his shoulder blades down into his lower back. No leg pain. Pain is worse with standing. Pain resolves when  he stops to sit. No numbness, tingling, or weakness.   He has known thoracic spondylosis, L1 compression fracture that is old, lumbar spondylosis and DDD. He has multilevel foraminal stenosis with mild/moderate central stenosis L3-L4 and mild central stenosis L4-L5. Think he has retrolisthesis L5-S1 as well.   Treatment options discussed with patient and following plan made:   - Recommend PT for TL spine. He declines for now.  - Referral to PMR at [facility] to discuss lumbar injections.  - Follow up with me in 6-8 weeks and prn.   I spent a total of 20 minutes in face-to-face and non-face-to-face activities related to this patient's care today including review of outside records, review of imaging, review of symptoms, physical exam, discussion of differential diagnosis, discussion of treatment options, and documentation.   Drake Leach PA-C Dept. of Neurosurgery

## 2023-12-10 ENCOUNTER — Other Ambulatory Visit (HOSPITAL_COMMUNITY)

## 2023-12-10 ENCOUNTER — Encounter: Payer: Self-pay | Admitting: Orthopedic Surgery

## 2023-12-10 ENCOUNTER — Ambulatory Visit (INDEPENDENT_AMBULATORY_CARE_PROVIDER_SITE_OTHER): Admitting: Orthopedic Surgery

## 2023-12-10 VITALS — BP 130/78 | Ht 70.0 in | Wt 167.0 lb

## 2023-12-10 DIAGNOSIS — M5136 Other intervertebral disc degeneration, lumbar region with discogenic back pain only: Secondary | ICD-10-CM

## 2023-12-10 DIAGNOSIS — M48061 Spinal stenosis, lumbar region without neurogenic claudication: Secondary | ICD-10-CM

## 2023-12-10 DIAGNOSIS — M4310 Spondylolisthesis, site unspecified: Secondary | ICD-10-CM | POA: Diagnosis not present

## 2023-12-10 DIAGNOSIS — M47816 Spondylosis without myelopathy or radiculopathy, lumbar region: Secondary | ICD-10-CM

## 2023-12-10 DIAGNOSIS — M47814 Spondylosis without myelopathy or radiculopathy, thoracic region: Secondary | ICD-10-CM | POA: Diagnosis not present

## 2023-12-10 NOTE — Patient Instructions (Signed)
 It was so nice to see you today. Thank you so much for coming in.    You have some wear and tear in your back. This is likely causing your back pain.   I want you to see physical medicine and rehab at the [facility] to discuss possible injections in your back. Dr. Yves Dill, Dr. Mariah Milling, and their PA Alphonzo Lemmings are great and will take good care of you. They should call you to schedule an appointment or you can call them at [phone].   We can also consider PT for your back. Let me know if you decide you want to proceed with this.   I will see you back in 6-8 weeks. Please do not hesitate to call if you have any questions or concerns. You can also message me in MyChart.   Drake Leach PA-C [phone]     The physicians and staff at [facility] at [facility] are committed to providing excellent care. You may receive a survey asking for feedback about your experience at our office. We value you your feedback and appreciate you taking the time to to fill it out. The [facility] leadership team is also available to discuss your experience in person, feel free to contact us [phone].

## 2023-12-11 ENCOUNTER — Ambulatory Visit

## 2023-12-16 ENCOUNTER — Other Ambulatory Visit (INDEPENDENT_AMBULATORY_CARE_PROVIDER_SITE_OTHER)

## 2023-12-16 DIAGNOSIS — E875 Hyperkalemia: Secondary | ICD-10-CM | POA: Diagnosis not present

## 2023-12-16 LAB — POTASSIUM: Potassium: 4.4 meq/L (ref 3.5–5.1)

## 2023-12-17 ENCOUNTER — Encounter: Payer: Self-pay | Admitting: Internal Medicine

## 2023-12-17 DIAGNOSIS — M9904 Segmental and somatic dysfunction of sacral region: Secondary | ICD-10-CM | POA: Diagnosis not present

## 2023-12-17 DIAGNOSIS — M9905 Segmental and somatic dysfunction of pelvic region: Secondary | ICD-10-CM | POA: Diagnosis not present

## 2023-12-17 DIAGNOSIS — M9901 Segmental and somatic dysfunction of cervical region: Secondary | ICD-10-CM | POA: Diagnosis not present

## 2023-12-17 DIAGNOSIS — M9903 Segmental and somatic dysfunction of lumbar region: Secondary | ICD-10-CM | POA: Diagnosis not present

## 2023-12-18 DIAGNOSIS — Z79899 Other long term (current) drug therapy: Secondary | ICD-10-CM | POA: Diagnosis not present

## 2023-12-18 DIAGNOSIS — M0579 Rheumatoid arthritis with rheumatoid factor of multiple sites without organ or systems involvement: Secondary | ICD-10-CM | POA: Diagnosis not present

## 2023-12-19 DIAGNOSIS — M5416 Radiculopathy, lumbar region: Secondary | ICD-10-CM | POA: Diagnosis not present

## 2023-12-19 DIAGNOSIS — M47814 Spondylosis without myelopathy or radiculopathy, thoracic region: Secondary | ICD-10-CM | POA: Diagnosis not present

## 2023-12-19 DIAGNOSIS — M48062 Spinal stenosis, lumbar region with neurogenic claudication: Secondary | ICD-10-CM | POA: Diagnosis not present

## 2024-01-13 DIAGNOSIS — M545 Low back pain, unspecified: Secondary | ICD-10-CM | POA: Diagnosis not present

## 2024-01-16 DIAGNOSIS — M545 Low back pain, unspecified: Secondary | ICD-10-CM | POA: Diagnosis not present

## 2024-01-20 DIAGNOSIS — M545 Low back pain, unspecified: Secondary | ICD-10-CM | POA: Diagnosis not present

## 2024-01-23 DIAGNOSIS — D2262 Melanocytic nevi of left upper limb, including shoulder: Secondary | ICD-10-CM | POA: Diagnosis not present

## 2024-01-23 DIAGNOSIS — D2261 Melanocytic nevi of right upper limb, including shoulder: Secondary | ICD-10-CM | POA: Diagnosis not present

## 2024-01-23 DIAGNOSIS — D225 Melanocytic nevi of trunk: Secondary | ICD-10-CM | POA: Diagnosis not present

## 2024-01-23 DIAGNOSIS — M545 Low back pain, unspecified: Secondary | ICD-10-CM | POA: Diagnosis not present

## 2024-01-23 DIAGNOSIS — D2272 Melanocytic nevi of left lower limb, including hip: Secondary | ICD-10-CM | POA: Diagnosis not present

## 2024-01-23 DIAGNOSIS — Z85828 Personal history of other malignant neoplasm of skin: Secondary | ICD-10-CM | POA: Diagnosis not present

## 2024-01-23 DIAGNOSIS — L570 Actinic keratosis: Secondary | ICD-10-CM | POA: Diagnosis not present

## 2024-01-27 ENCOUNTER — Encounter: Payer: Medicare HMO | Admitting: Internal Medicine

## 2024-01-27 DIAGNOSIS — M545 Low back pain, unspecified: Secondary | ICD-10-CM | POA: Diagnosis not present

## 2024-01-30 DIAGNOSIS — M545 Low back pain, unspecified: Secondary | ICD-10-CM | POA: Diagnosis not present

## 2024-01-30 DIAGNOSIS — M5416 Radiculopathy, lumbar region: Secondary | ICD-10-CM | POA: Diagnosis not present

## 2024-01-30 DIAGNOSIS — M48062 Spinal stenosis, lumbar region with neurogenic claudication: Secondary | ICD-10-CM | POA: Diagnosis not present

## 2024-02-03 DIAGNOSIS — M545 Low back pain, unspecified: Secondary | ICD-10-CM | POA: Diagnosis not present

## 2024-02-04 ENCOUNTER — Ambulatory Visit: Admitting: Orthopedic Surgery

## 2024-02-05 DIAGNOSIS — M545 Low back pain, unspecified: Secondary | ICD-10-CM | POA: Diagnosis not present

## 2024-02-06 DIAGNOSIS — M5416 Radiculopathy, lumbar region: Secondary | ICD-10-CM | POA: Diagnosis not present

## 2024-02-06 DIAGNOSIS — M48062 Spinal stenosis, lumbar region with neurogenic claudication: Secondary | ICD-10-CM | POA: Diagnosis not present

## 2024-02-11 DIAGNOSIS — M545 Low back pain, unspecified: Secondary | ICD-10-CM | POA: Diagnosis not present

## 2024-02-13 DIAGNOSIS — M545 Low back pain, unspecified: Secondary | ICD-10-CM | POA: Diagnosis not present

## 2024-02-17 DIAGNOSIS — M545 Low back pain, unspecified: Secondary | ICD-10-CM | POA: Diagnosis not present

## 2024-02-19 DIAGNOSIS — M545 Low back pain, unspecified: Secondary | ICD-10-CM | POA: Diagnosis not present

## 2024-02-24 DIAGNOSIS — M545 Low back pain, unspecified: Secondary | ICD-10-CM | POA: Diagnosis not present

## 2024-02-26 DIAGNOSIS — M545 Low back pain, unspecified: Secondary | ICD-10-CM | POA: Diagnosis not present

## 2024-03-02 DIAGNOSIS — M545 Low back pain, unspecified: Secondary | ICD-10-CM | POA: Diagnosis not present

## 2024-03-04 DIAGNOSIS — M545 Low back pain, unspecified: Secondary | ICD-10-CM | POA: Diagnosis not present

## 2024-03-09 DIAGNOSIS — M545 Low back pain, unspecified: Secondary | ICD-10-CM | POA: Diagnosis not present

## 2024-03-11 ENCOUNTER — Other Ambulatory Visit: Payer: Self-pay | Admitting: Internal Medicine

## 2024-03-11 DIAGNOSIS — M545 Low back pain, unspecified: Secondary | ICD-10-CM | POA: Diagnosis not present

## 2024-03-11 MED ORDER — ROSUVASTATIN CALCIUM 5 MG PO TABS: 5.0000 mg | ORAL_TABLET | Freq: Every day | ORAL | 0 refills | Status: DC

## 2024-03-11 NOTE — Telephone Encounter (Signed)
 Copied from CRM [id number]. Topic: Clinical - Medication Refill >> Mar 11, 2024  8:30 AM Franky GRADE wrote: Medication: rosuvastatin  (CRESTOR ) 5 MG tablet [[id number]]  Has the patient contacted their pharmacy? Yes, pharmacy is calling to place the refill request, it was also faxed to the office on 03/02/2024 but have not gotten a response.  (Agent: If no, request that the patient contact the pharmacy for the refill. If patient does not wish to contact the pharmacy document the reason why and proceed with request.) (Agent: If yes, when and what did the pharmacy advise?)  This is the patient's preferred pharmacy:  [facility] - [place], [place] - [address] [place] [address] Phone: [phone] Fax: [phone]  Is this the correct pharmacy for this prescription? Yes If no, delete pharmacy and type the correct one.   Has the prescription been filled recently? No  Is the patient out of the medication? Pharmacy is unsure  Has the patient been seen for an appointment in the last year OR does the patient have an upcoming appointment? Yes  Can we respond through MyChart? Yes  Agent: Please be advised that Rx refills may take up to 3 business days. We ask that you follow-up with your pharmacy.

## 2024-03-11 NOTE — Telephone Encounter (Signed)
 Last Fill: 07/04/22  Last OV: 09/25/23 Next OV: 03/12/24  Routing to provider for review/authorization.

## 2024-03-12 ENCOUNTER — Encounter: Payer: Self-pay | Admitting: Internal Medicine

## 2024-03-12 ENCOUNTER — Ambulatory Visit (INDEPENDENT_AMBULATORY_CARE_PROVIDER_SITE_OTHER): Admitting: Internal Medicine

## 2024-03-12 VITALS — BP 138/72 | HR 77 | Temp 98.0°F | Resp 20 | Ht 70.0 in | Wt 166.0 lb

## 2024-03-12 DIAGNOSIS — E7800 Pure hypercholesterolemia, unspecified: Secondary | ICD-10-CM

## 2024-03-12 DIAGNOSIS — I350 Nonrheumatic aortic (valve) stenosis: Secondary | ICD-10-CM

## 2024-03-12 DIAGNOSIS — M069 Rheumatoid arthritis, unspecified: Secondary | ICD-10-CM | POA: Diagnosis not present

## 2024-03-12 DIAGNOSIS — M545 Low back pain, unspecified: Secondary | ICD-10-CM

## 2024-03-12 DIAGNOSIS — Z0000 Encounter for general adult medical examination without abnormal findings: Secondary | ICD-10-CM | POA: Diagnosis not present

## 2024-03-12 DIAGNOSIS — Z1211 Encounter for screening for malignant neoplasm of colon: Secondary | ICD-10-CM | POA: Diagnosis not present

## 2024-03-12 DIAGNOSIS — E1165 Type 2 diabetes mellitus with hyperglycemia: Secondary | ICD-10-CM

## 2024-03-12 DIAGNOSIS — D649 Anemia, unspecified: Secondary | ICD-10-CM

## 2024-03-12 NOTE — Progress Notes (Signed)
 Subjective:    Patient ID: Donald [name], male    DOB: 02-17-1946, 78 y.o.   MRN: [id number]  Patient here for  Chief Complaint  Patient presents with   Annual Exam    HPI Here for a physical exam. Reports she has been doing relatively well. Has been seeing Dr Avanell for back pain. S/p ESI. Has f/u with [facility]. Back stable. No chest pain or sob reported. No abdominal pain or bowel change reported. Seeing cardiology. Planning for f/u ECHO 05/2024. Discussed f/u with neurology. Discussed colon cancer screening.    Past Medical History:  Diagnosis Date   Aortic stenosis    Carpal tunnel syndrome    Diabetes mellitus without complication (HCC)    Hyperlipidemia    Nephrolithiasis    H/O stones   Rheumatoid arthritis (HCC)    Past Surgical History:  Procedure Laterality Date   APPENDECTOMY  1962   CARPAL TUNNEL RELEASE Bilateral    hands   Family History  Problem Relation Age of Onset   Heart disease Father        Heart attack-54   Hypertension Father    Diabetes Maternal Grandmother    Social History   Socioeconomic History   Marital status: Married    Spouse name: Not on file   Number of children: 3   Years of education: Not on file   Highest education level: Not on file  Occupational History   Occupation: Retired-Mechanic  Tobacco Use   Smoking status: Former    Types: Cigarettes   Smokeless tobacco: Never  Vaping Use   Vaping status: Never Used  Substance and Sexual Activity   Alcohol use: No    Alcohol/week: 0.0 standard drinks of alcohol   Drug use: No   Sexual activity: Not Currently  Other Topics Concern   Not on file  Social History Narrative   Married   Social Drivers of Health   Financial Resource Strain: Low Risk  (12/19/2023)   Received from [facility]   Overall Financial Resource Strain (CARDIA)    Difficulty of Paying Living Expenses: Not hard at all  Food Insecurity: No Food Insecurity (12/19/2023)   Received from  [facility]   Hunger Vital Sign    Within the past 12 months, you worried that your food would run out before you got the money to buy more.: Never true    Within the past 12 months, the food you bought just didn't last and you didn't have money to get more.: Never true  Transportation Needs: No Transportation Needs (12/19/2023)   Received from [facility] - Transportation    In the past 12 months, has lack of transportation kept you from medical appointments or from getting medications?: No    Lack of Transportation (Non-Medical): No  Physical Activity: Inactive (08/20/2023)   Exercise Vital Sign    Days of Exercise per Week: 0 days    Minutes of Exercise per Session: 0 min  Stress: No Stress Concern Present (08/20/2023)   [employer] [facility] [employer] - Occupational Stress Questionnaire    Feeling of Stress : Not at all  Social Connections: Moderately Integrated (08/20/2023)   Social Connection and Isolation Panel    Frequency of Communication with Friends and Family: More than three times a week    Frequency of Social Gatherings with Friends and Family: Three times a week    Attends Religious Services: More than 4  times per year    Active Member of Clubs or Organizations: No    Attends Banker Meetings: Never    Marital Status: Married     Review of Systems  Constitutional:  Negative for appetite change and unexpected weight change.  HENT:  Negative for congestion and sinus pressure.   Respiratory:  Negative for cough, chest tightness and shortness of breath.   Cardiovascular:  Negative for chest pain, palpitations and leg swelling.  Gastrointestinal:  Negative for abdominal pain, diarrhea, nausea and vomiting.  Genitourinary:  Negative for difficulty urinating and dysuria.  Musculoskeletal:  Positive for back pain. Negative for joint swelling and myalgias.  Skin:  Negative for color change and rash.   Neurological:  Negative for dizziness and headaches.  Psychiatric/Behavioral:  Negative for agitation and dysphoric mood.        Objective:     BP 138/72   Pulse 77   Temp 98 F (36.7 C)   Resp 20   Ht 5' 10 (1.778 m)   Wt 166 lb (75.3 kg)   SpO2 98%   BMI 23.82 kg/m  Wt Readings from Last 3 Encounters:  03/12/24 166 lb (75.3 kg)  12/10/23 167 lb (75.8 kg)  11/15/23 167 lb 12.8 oz (76.1 kg)    Physical Exam Vitals reviewed.  Constitutional:      General: He is not in acute distress.    Appearance: Normal appearance. He is well-developed.  HENT:     Head: Normocephalic and atraumatic.     Right Ear: External ear normal.     Left Ear: External ear normal.     Mouth/Throat:     Pharynx: No oropharyngeal exudate or posterior oropharyngeal erythema.  Eyes:     General: No scleral icterus.       Right eye: No discharge.        Left eye: No discharge.     Conjunctiva/sclera: Conjunctivae normal.  Cardiovascular:     Rate and Rhythm: Normal rate and regular rhythm.  Pulmonary:     Effort: Pulmonary effort is normal. No respiratory distress.     Breath sounds: Normal breath sounds.  Abdominal:     General: Bowel sounds are normal.     Palpations: Abdomen is soft.     Tenderness: There is no abdominal tenderness.  Musculoskeletal:        General: No swelling or tenderness.     Cervical back: Neck supple. No tenderness.  Lymphadenopathy:     Cervical: No cervical adenopathy.  Skin:    Findings: No erythema or rash.  Neurological:     Mental Status: He is alert.  Psychiatric:        Mood and Affect: Mood normal.        Behavior: Behavior normal.         Outpatient Encounter Medications as of 03/12/2024  Medication Sig   ACCU-CHEK AVIVA PLUS test strip TEST TWO TIMES DAILY   ACCU-CHEK SOFTCLIX LANCETS lancets TEST TWO TIMES DAILY   APPLE CIDER VINEGAR PO Take 1 tablet by mouth daily.   Calcium  Carb-Cholecalciferol (CALCIUM  + VITAMIN D3 PO) Take 2 each by  mouth daily.   cetirizine (ZYRTEC) 10 MG tablet Take 10 mg by mouth daily as needed for allergies.   Coenzyme Q10 (COQ10) 100 MG CAPS Take 100 mg by mouth daily.   cyanocobalamin  (VITAMIN B12) 1000 MCG tablet Take 1,000 mcg by mouth daily.   L-Lysine 500 MG CAPS Take 500 mg by mouth daily.  leflunomide (ARAVA) 10 MG tablet Take 10 mg by mouth daily.    magnesium oxide (MAG-OX) 400 (240 Mg) MG tablet Take 400 mg by mouth daily.   Misc Natural Products (BEET ROOT PO) Take 1 capsule by mouth daily.   Multiple Vitamin (MULTI-VITAMIN) tablet Take 1 tablet by mouth daily.   rivastigmine (EXELON) 1.5 MG capsule Take 1.5 mg by mouth 2 (two) times daily.   rosuvastatin  (CRESTOR ) 5 MG tablet Take 1 tablet (5 mg total) by mouth daily.   No facility-administered encounter medications on file as of 03/12/2024.     Lab Results  Component Value Date   WBC 4.8 03/12/2024   HGB 14.1 03/12/2024   HCT 42.3 03/12/2024   PLT 188 03/12/2024   GLUCOSE 101 (H) 03/12/2024   CHOL 191 03/12/2024   TRIG 189 (H) 03/12/2024   HDL 42 03/12/2024   LDLCALC 116 (H) 03/12/2024   ALT 17 03/12/2024   AST 20 03/12/2024   NA 139 03/12/2024   K 5.1 03/12/2024   CL 102 03/12/2024   CREATININE 1.07 03/12/2024   BUN 23 03/12/2024   CO2 19 (L) 03/12/2024   TSH 0.88 09/25/2023   PSA 1.19 09/25/2023   HGBA1C 6.1 (H) 03/12/2024    MR LUMBAR SPINE WO CONTRAST Result Date: 12/06/2023 CLINICAL DATA:  Low back pain, compression fracture EXAM: MRI LUMBAR SPINE WITHOUT CONTRAST TECHNIQUE: Multiplanar, multisequence MR imaging of the lumbar spine was performed. No intravenous contrast was administered. COMPARISON:  None Available. FINDINGS: Segmentation: Standard. Alignment:  Grade 1 anterolisthesis of L4 on L5. Vertebrae: Chronic wedge deformity at L1. Degenerative endplate marrow changes at a few levels. Conus medullaris and cauda equina: The conus medullaris terminates at the level of L1-L2. The distal spinal cord signal  intensity is normal. Paraspinal and other soft tissues: The visualized abdomen and pelvis show no soft tissue abnormality. The visualized aorta is normal. Disc levels: L1-L2: Mild disc bulge. Moderate bilateral facet arthropathy. Mild bilateral neuroforaminal stenosis. Mild spinal canal stenosis. L2-L3: Disc bulge. Moderate bilateral facet arthropathy. Mild bilateral neuroforaminal stenosis. Mild spinal canal stenosis. L3-L4: Disc bulge. Moderate bilateral facet arthropathy. Moderate bilateral neuroforaminal stenosis. Mild spinal canal stenosis. L4-L5: Disc bulge. Moderate bilateral facet arthropathy. Moderate bilateral neuroforaminal stenosis. Mild spinal canal stenosis. L5-S1: Disc bulge. Moderate bilateral facet arthropathy. Moderate bilateral neuroforaminal stenosis. No spinal canal stenosis. IMPRESSION: 1. 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  No acute compression fracture. 2. Mild canal stenosis at several levels secondary to disc bulging and facet arthropathy. 3. Moderate foraminal stenosis L3-L4, L4-L5, and L5-S1 secondary to disc bulging and facet arthropathy. Electronically Signed   By: Clem Savory M.D.   On: 12/06/2023 13:49       Assessment & Plan:  Routine general medical examination at a health care facility  Type 2 diabetes mellitus with hyperglycemia, without long-term current use of insulin (HCC) Assessment & Plan: Low carb diet and exercise. Follow met b and A1c.   Orders: -     Hemoglobin A1c -     Basic metabolic panel with GFR -     Microalbumin / creatinine urine ratio  Hypercholesterolemia Assessment & Plan: Continue crestor .  Low cholesterol diet and exercise.  Check lipid panel.   Orders: -     Hepatic function panel -     Lipid panel  Anemia, unspecified type Assessment & Plan: Recheck cbc.   Orders: -     Iron, TIBC and Ferritin Panel -     CBC with Differential/Platelet  Aortic valve stenosis, etiology of cardiac valve disease unspecified Assessment &  Plan: ECHO 02/2022 - Echo showed LVEF 50-55%, no WMA, G1DD, trivial MR, mod to severe AS with Aortic valve mean gradient , peak gradient , , Vmax 3.06  m/s, DVI = 0.26, Aortic valve area 0.95.  F/u ECHO 03/2023 - moderate to severe AS.   Dr Verlin evaluated 11/15/23 - f/u echo in 6 months.    Bilateral low back pain without sciatica, unspecified chronicity Assessment & Plan: Has been seeing Dr Avanell. S/p ESI.    Rheumatoid arthritis, involving unspecified site, unspecified whether rheumatoid factor present [facility]) Assessment & Plan: On leflunomide.  Followed by rheumatology. Stable.    Colon cancer screening Assessment & Plan: Discussed colonoscopy. Declines. Agreeable for cologuard.   Orders: -     Cologuard     Allena Hamilton, MD

## 2024-03-13 ENCOUNTER — Encounter: Payer: Self-pay | Admitting: Internal Medicine

## 2024-03-13 DIAGNOSIS — Z1211 Encounter for screening for malignant neoplasm of colon: Secondary | ICD-10-CM | POA: Insufficient documentation

## 2024-03-13 NOTE — Assessment & Plan Note (Signed)
 Low-carb diet and exercise.  Follow met b and A1c.

## 2024-03-13 NOTE — Assessment & Plan Note (Signed)
 Continue crestor .  Low cholesterol diet and exercise.  Check lipid panel.

## 2024-03-13 NOTE — Assessment & Plan Note (Signed)
On leflunomide.  Followed by rheumatology.  Stable.

## 2024-03-13 NOTE — Assessment & Plan Note (Signed)
 Has been seeing Dr Avanell. S/p ESI.

## 2024-03-13 NOTE — Assessment & Plan Note (Signed)
 Recheck cbc.

## 2024-03-13 NOTE — Assessment & Plan Note (Signed)
 ECHO 02/2022 - Echo showed LVEF 50-55%, no WMA, G1DD, trivial MR, mod to severe AS with Aortic valve mean gradient , peak gradient , , Vmax 3.06  m/s, DVI = 0.26, Aortic valve area 0.95.  F/u ECHO 03/2023 - moderate to severe AS.   Dr Verlin evaluated 11/15/23 - f/u echo in 6 months.

## 2024-03-13 NOTE — Assessment & Plan Note (Signed)
 Discussed colonoscopy. Declines. Agreeable for cologuard.

## 2024-03-14 ENCOUNTER — Other Ambulatory Visit: Payer: Self-pay | Admitting: Internal Medicine

## 2024-03-14 ENCOUNTER — Ambulatory Visit: Payer: Self-pay | Admitting: Internal Medicine

## 2024-03-14 DIAGNOSIS — D7589 Other specified diseases of blood and blood-forming organs: Secondary | ICD-10-CM

## 2024-03-14 DIAGNOSIS — E7800 Pure hypercholesterolemia, unspecified: Secondary | ICD-10-CM

## 2024-03-14 DIAGNOSIS — E875 Hyperkalemia: Secondary | ICD-10-CM

## 2024-03-14 LAB — HEPATIC FUNCTION PANEL
ALT: 17 IU/L (ref 0–44)
AST: 20 IU/L (ref 0–40)
Albumin: 4.8 g/dL (ref 3.8–4.8)
Alkaline Phosphatase: 80 IU/L (ref 44–121)
Bilirubin Total: 0.4 mg/dL (ref 0.0–1.2)
Bilirubin, Direct: 0.15 mg/dL (ref 0.00–0.40)
Total Protein: 6.7 g/dL (ref 6.0–8.5)

## 2024-03-14 LAB — CBC WITH DIFFERENTIAL/PLATELET
Basophils Absolute: 0.1 x10E3/uL (ref 0.0–0.2)
Basos: 1 %
EOS (ABSOLUTE): 0.2 x10E3/uL (ref 0.0–0.4)
Eos: 5 %
Hematocrit: 42.3 % (ref 37.5–51.0)
Hemoglobin: 14.1 g/dL (ref 13.0–17.7)
Immature Grans (Abs): 0 x10E3/uL (ref 0.0–0.1)
Immature Granulocytes: 0 %
Lymphocytes Absolute: 1 x10E3/uL (ref 0.7–3.1)
Lymphs: 20 %
MCH: 34.7 pg — ABNORMAL HIGH (ref 26.6–33.0)
MCHC: 33.3 g/dL (ref 31.5–35.7)
MCV: 104 fL — ABNORMAL HIGH (ref 79–97)
Monocytes Absolute: 0.7 x10E3/uL (ref 0.1–0.9)
Monocytes: 14 %
Neutrophils Absolute: 2.9 x10E3/uL (ref 1.4–7.0)
Neutrophils: 60 %
Platelets: 188 x10E3/uL (ref 150–450)
RBC: 4.06 x10E6/uL — ABNORMAL LOW (ref 4.14–5.80)
RDW: 11.7 % (ref 11.6–15.4)
WBC: 4.8 x10E3/uL (ref 3.4–10.8)

## 2024-03-14 LAB — LIPID PANEL
Chol/HDL Ratio: 4.5 ratio (ref 0.0–5.0)
Cholesterol, Total: 191 mg/dL (ref 100–199)
HDL: 42 mg/dL (ref >39)
LDL Chol Calc (NIH): 116 mg/dL — ABNORMAL HIGH (ref 0–99)
Triglycerides: 189 mg/dL — ABNORMAL HIGH (ref 0–149)
VLDL Cholesterol Cal: 33 mg/dL (ref 5–40)

## 2024-03-14 LAB — BASIC METABOLIC PANEL WITH GFR
BUN/Creatinine Ratio: 21 (ref 10–24)
BUN: 23 mg/dL (ref 8–27)
CO2: 19 mmol/L — ABNORMAL LOW (ref 20–29)
Calcium: 9.5 mg/dL (ref 8.6–10.2)
Chloride: 102 mmol/L (ref 96–106)
Creatinine, Ser: 1.07 mg/dL (ref 0.76–1.27)
Glucose: 101 mg/dL — ABNORMAL HIGH (ref 70–99)
Potassium: 5.1 mmol/L (ref 3.5–5.2)
Sodium: 139 mmol/L (ref 134–144)
eGFR: 72 mL/min/1.73 (ref >59)

## 2024-03-14 LAB — IRON,TIBC AND FERRITIN PANEL
Ferritin: 531 ng/mL — ABNORMAL HIGH (ref 30–400)
Iron Saturation: 31 % (ref 15–55)
Iron: 85 ug/dL (ref 38–169)
Total Iron Binding Capacity: 272 ug/dL (ref 250–450)
UIBC: 187 ug/dL (ref 111–343)

## 2024-03-14 LAB — HEMOGLOBIN A1C
Est. average glucose Bld gHb Est-mCnc: 128 mg/dL
Hgb A1c MFr Bld: 6.1 % — ABNORMAL HIGH (ref 4.8–5.6)

## 2024-03-14 LAB — MICROALBUMIN / CREATININE URINE RATIO
Creatinine, Urine: 125.4 mg/dL
Microalb/Creat Ratio: 11 mg/g{creat} (ref 0–29)
Microalbumin, Urine: 14.3 ug/mL

## 2024-03-14 NOTE — Progress Notes (Signed)
Orders placed for f/u labs.  

## 2024-03-16 DIAGNOSIS — M545 Low back pain, unspecified: Secondary | ICD-10-CM | POA: Diagnosis not present

## 2024-03-18 DIAGNOSIS — M545 Low back pain, unspecified: Secondary | ICD-10-CM | POA: Diagnosis not present

## 2024-03-18 MED ORDER — ROSUVASTATIN CALCIUM 10 MG PO TABS: 10.0000 mg | ORAL_TABLET | Freq: Every day | ORAL | 0 refills | Status: DC

## 2024-03-18 MED ORDER — ROSUVASTATIN CALCIUM 10 MG PO TABS: 10.0000 mg | ORAL_TABLET | Freq: Every day | ORAL | 1 refills | Status: DC

## 2024-03-19 ENCOUNTER — Encounter: Payer: Self-pay | Admitting: Family Medicine

## 2024-03-19 ENCOUNTER — Other Ambulatory Visit: Payer: Self-pay | Admitting: Family Medicine

## 2024-03-19 DIAGNOSIS — M5416 Radiculopathy, lumbar region: Secondary | ICD-10-CM | POA: Diagnosis not present

## 2024-03-19 DIAGNOSIS — M5414 Radiculopathy, thoracic region: Secondary | ICD-10-CM | POA: Diagnosis not present

## 2024-03-19 DIAGNOSIS — M48062 Spinal stenosis, lumbar region with neurogenic claudication: Secondary | ICD-10-CM | POA: Diagnosis not present

## 2024-03-24 ENCOUNTER — Inpatient Hospital Stay
Admission: RE | Admit: 2024-03-24 | Discharge: 2024-03-24 | Discharge status: Home / Self Care | Source: Ambulatory Visit | Attending: Family Medicine | Admitting: Family Medicine

## 2024-03-24 DIAGNOSIS — M47814 Spondylosis without myelopathy or radiculopathy, thoracic region: Secondary | ICD-10-CM | POA: Diagnosis not present

## 2024-03-24 DIAGNOSIS — M5124 Other intervertebral disc displacement, thoracic region: Secondary | ICD-10-CM | POA: Diagnosis not present

## 2024-03-24 DIAGNOSIS — M5414 Radiculopathy, thoracic region: Secondary | ICD-10-CM

## 2024-03-25 DIAGNOSIS — M545 Low back pain, unspecified: Secondary | ICD-10-CM | POA: Diagnosis not present

## 2024-03-30 DIAGNOSIS — M545 Low back pain, unspecified: Secondary | ICD-10-CM | POA: Diagnosis not present

## 2024-04-01 ENCOUNTER — Other Ambulatory Visit (INDEPENDENT_AMBULATORY_CARE_PROVIDER_SITE_OTHER)

## 2024-04-01 DIAGNOSIS — E875 Hyperkalemia: Secondary | ICD-10-CM

## 2024-04-01 DIAGNOSIS — M5416 Radiculopathy, lumbar region: Secondary | ICD-10-CM | POA: Diagnosis not present

## 2024-04-01 DIAGNOSIS — M5414 Radiculopathy, thoracic region: Secondary | ICD-10-CM | POA: Diagnosis not present

## 2024-04-01 DIAGNOSIS — M47814 Spondylosis without myelopathy or radiculopathy, thoracic region: Secondary | ICD-10-CM | POA: Diagnosis not present

## 2024-04-01 DIAGNOSIS — M48062 Spinal stenosis, lumbar region with neurogenic claudication: Secondary | ICD-10-CM | POA: Diagnosis not present

## 2024-04-01 DIAGNOSIS — D7589 Other specified diseases of blood and blood-forming organs: Secondary | ICD-10-CM | POA: Diagnosis not present

## 2024-04-01 LAB — POTASSIUM: Potassium: 4.8 meq/L (ref 3.5–5.1)

## 2024-04-01 LAB — VITAMIN B12: Vitamin B-12: >1500 pg/mL — ABNORMAL HIGH (ref 211–911)

## 2024-04-03 ENCOUNTER — Ambulatory Visit: Payer: Self-pay | Admitting: Internal Medicine

## 2024-04-03 LAB — FOLATE RBC: RBC Folate: 648 ng/mL (ref >280)

## 2024-04-06 DIAGNOSIS — M545 Low back pain, unspecified: Secondary | ICD-10-CM | POA: Diagnosis not present

## 2024-04-06 NOTE — Telephone Encounter (Signed)
 Copied from CRM [id number]. Topic: Clinical - Lab/Test Results >> Apr 06, 2024  1:56 PM Mia F wrote: Reason for CRM: Pt is returning office call. Message regarding pt B12 dosage. Pt says he takes 2 3000 chewables but does not know how much one will be. Please advise.

## 2024-04-08 DIAGNOSIS — M545 Low back pain, unspecified: Secondary | ICD-10-CM | POA: Diagnosis not present

## 2024-04-13 DIAGNOSIS — M545 Low back pain, unspecified: Secondary | ICD-10-CM | POA: Diagnosis not present

## 2024-04-16 DIAGNOSIS — M545 Low back pain, unspecified: Secondary | ICD-10-CM | POA: Diagnosis not present

## 2024-04-17 DIAGNOSIS — M5414 Radiculopathy, thoracic region: Secondary | ICD-10-CM | POA: Diagnosis not present

## 2024-04-17 DIAGNOSIS — M4804 Spinal stenosis, thoracic region: Secondary | ICD-10-CM | POA: Diagnosis not present

## 2024-04-21 DIAGNOSIS — M0579 Rheumatoid arthritis with rheumatoid factor of multiple sites without organ or systems involvement: Secondary | ICD-10-CM | POA: Diagnosis not present

## 2024-04-21 DIAGNOSIS — Z79899 Other long term (current) drug therapy: Secondary | ICD-10-CM | POA: Diagnosis not present

## 2024-04-22 DIAGNOSIS — M545 Low back pain, unspecified: Secondary | ICD-10-CM | POA: Diagnosis not present

## 2024-04-27 DIAGNOSIS — M545 Low back pain, unspecified: Secondary | ICD-10-CM | POA: Diagnosis not present

## 2024-04-29 DIAGNOSIS — H524 Presbyopia: Secondary | ICD-10-CM | POA: Diagnosis not present

## 2024-04-29 DIAGNOSIS — M545 Low back pain, unspecified: Secondary | ICD-10-CM | POA: Diagnosis not present

## 2024-05-04 DIAGNOSIS — M545 Low back pain, unspecified: Secondary | ICD-10-CM | POA: Diagnosis not present

## 2024-05-06 DIAGNOSIS — M545 Low back pain, unspecified: Secondary | ICD-10-CM | POA: Diagnosis not present

## 2024-05-12 DIAGNOSIS — M47814 Spondylosis without myelopathy or radiculopathy, thoracic region: Secondary | ICD-10-CM | POA: Diagnosis not present

## 2024-05-12 DIAGNOSIS — M5414 Radiculopathy, thoracic region: Secondary | ICD-10-CM | POA: Diagnosis not present

## 2024-05-12 DIAGNOSIS — M5416 Radiculopathy, lumbar region: Secondary | ICD-10-CM | POA: Diagnosis not present

## 2024-05-12 DIAGNOSIS — M48062 Spinal stenosis, lumbar region with neurogenic claudication: Secondary | ICD-10-CM | POA: Diagnosis not present

## 2024-05-12 DIAGNOSIS — M4804 Spinal stenosis, thoracic region: Secondary | ICD-10-CM | POA: Diagnosis not present

## 2024-05-18 DIAGNOSIS — M545 Low back pain, unspecified: Secondary | ICD-10-CM | POA: Diagnosis not present

## 2024-05-19 DIAGNOSIS — M48062 Spinal stenosis, lumbar region with neurogenic claudication: Secondary | ICD-10-CM | POA: Diagnosis not present

## 2024-05-19 DIAGNOSIS — M5416 Radiculopathy, lumbar region: Secondary | ICD-10-CM | POA: Diagnosis not present

## 2024-05-25 DIAGNOSIS — M545 Low back pain, unspecified: Secondary | ICD-10-CM | POA: Diagnosis not present

## 2024-05-27 ENCOUNTER — Ambulatory Visit: Attending: Cardiovascular Disease

## 2024-05-27 DIAGNOSIS — I350 Nonrheumatic aortic (valve) stenosis: Secondary | ICD-10-CM

## 2024-05-27 LAB — ECHOCARDIOGRAM COMPLETE
AR max vel: 0.61 cm2
AV Area VTI: 0.51 cm2
AV Area mean vel: 0.55 cm2
AV Mean grad: 39.5 mmHg
AV Peak grad: 59.6 mmHg
Ao pk vel: 3.86 m/s
Area-P 1/2: 4.39 cm2
S' Lateral: 2.94 cm

## 2024-05-31 ENCOUNTER — Ambulatory Visit: Payer: Self-pay | Admitting: Cardiovascular Disease

## 2024-06-01 DIAGNOSIS — M545 Low back pain, unspecified: Secondary | ICD-10-CM | POA: Diagnosis not present

## 2024-06-08 ENCOUNTER — Ambulatory Visit: Attending: Cardiovascular Disease | Admitting: Cardiovascular Disease

## 2024-06-08 ENCOUNTER — Encounter: Payer: Self-pay | Admitting: Cardiovascular Disease

## 2024-06-08 VITALS — BP 122/70 | HR 5 | Ht 70.0 in | Wt 168.2 lb

## 2024-06-08 DIAGNOSIS — M545 Low back pain, unspecified: Secondary | ICD-10-CM | POA: Diagnosis not present

## 2024-06-08 DIAGNOSIS — I350 Nonrheumatic aortic (valve) stenosis: Secondary | ICD-10-CM | POA: Diagnosis not present

## 2024-06-08 NOTE — Patient Instructions (Signed)
 Medication Instructions:  Your physician recommends that you continue on your current medications as directed. Please refer to the Current Medication list given to you today.  *If you need a refill on your cardiac medications before your next appointment, please call your pharmacy*  Lab Work: none If you have labs (blood work) drawn today and your tests are completely normal, you will receive your results only by: MyChart Message (if you have MyChart) OR A paper copy in the mail If you have any lab test that is abnormal or we need to change your treatment, we will call you to review the results.  Testing/Procedures: Your physician has requested that you have an echocardiogram in mid March 2026. Echocardiography is a painless test that uses sound waves to create images of your heart. It provides your doctor with information about the size and shape of your heart and how well your heart's chambers and valves are working. This procedure takes approximately one hour. There are no restrictions for this procedure. Please do NOT wear cologne, perfume, aftershave, or lotions (deodorant is allowed). Please arrive 15 minutes prior to your appointment time.  Please note: We ask at that you not bring children with you during ultrasound (echo/ vascular) testing. Due to room size and safety concerns, children are not allowed in the ultrasound rooms during exams. Our front office staff cannot provide observation of children in our lobby area while testing is being conducted. An adult accompanying a patient to their appointment will only be allowed in the ultrasound room at the discretion of the ultrasound technician under special circumstances. We apologize for any inconvenience.   Follow-Up: At [facility], you and your health needs are our priority.  As part of our continuing mission to provide you with exceptional heart care, our providers are all part of one team.  This team includes your primary  Cardiologist (physician) and Advanced Practice Providers or APPs (Physician Assistants and Nurse Practitioners) who all work together to provide you with the care you need, when you need it.  Your next appointment:   March 2026 after echo  Provider:   Dr Verlin   We recommend signing up for the patient portal called MyChart.  Sign up information is provided on this After Visit Summary.  MyChart is used to connect with patients for Virtual Visits (Telemedicine).  Patients are able to view lab/test results, encounter notes, upcoming appointments, etc.  Non-urgent messages can be sent to your provider as well.   To learn more about what you can do with MyChart, go to [email or web address].   Other Instructions

## 2024-06-08 NOTE — Progress Notes (Signed)
 [facility] Note  Chief Complaint  Patient presents with   Follow-up    Aortic stenosis   History of Present Illness: 78 yo [name] with history of hyperlipidemia, DM2, kidney stones, rheumatoid arthritis and aortic stenosis who is here today for follow up. He has been followed for aortic stenosis. Echo in 2023 and 2024 with moderate AS. He was seen in our [facility] office on 10/15/23 and only c/o back pain due to an injury. Echo 11/04/23 with LVEF=60-65%. Mild mitral regurgitation. Severe aortic valve stenosis with mean gradient 32 mmHg, AVA 0.59 cm2, DI 0.20. SVI 26. He was feeling well when I saw him in March 2025 and we elected to follow his aortic stenosis. Echo September 2025 with LVEF=50-55%. Mild MR. Severe aortic stenosis with mean gradient 40 mmHg, AVA 0.6 cm2, DI 0.18, SVI 25.   He is here today for follow up. The patient denies any chest pain, dyspnea, palpitations, lower extremity edema, orthopnea, PND, dizziness, near syncope or syncope. He [place] in [name], [place] with his wife. He is a retired Curator. He has no active dental issues.   Primary Care Physician: Glendia Shad, MD Primary Cardiologist: Agbor-Etang Referring Cardiologist: Darliss  Past Medical History:  Diagnosis Date   Aortic stenosis    Carpal tunnel syndrome    Diabetes mellitus without complication (HCC)    Hyperlipidemia    Nephrolithiasis    H/O stones   Rheumatoid arthritis (HCC)     Past Surgical History:  Procedure Laterality Date   APPENDECTOMY  1962   CARPAL TUNNEL RELEASE Bilateral    hands    Current Outpatient Medications  Medication Sig Dispense Refill   ACCU-CHEK AVIVA PLUS test strip TEST TWO TIMES DAILY 200 strip 2   ACCU-CHEK SOFTCLIX LANCETS lancets TEST TWO TIMES DAILY 200 each 0   APPLE CIDER VINEGAR PO Take 1 tablet by mouth daily.     Calcium  Carb-Cholecalciferol (CALCIUM  + VITAMIN D3 PO) Take 2 each by mouth daily.     cetirizine (ZYRTEC) 10 MG  tablet Take 10 mg by mouth daily as needed for allergies.     Coenzyme Q10 (COQ10) 100 MG CAPS Take 100 mg by mouth daily.     cyanocobalamin  (VITAMIN B12) 1000 MCG tablet Take 1,000 mcg by mouth daily.     L-Lysine 500 MG CAPS Take 500 mg by mouth daily.     leflunomide (ARAVA) 10 MG tablet Take 10 mg by mouth daily.      magnesium oxide (MAG-OX) 400 (240 Mg) MG tablet Take 400 mg by mouth daily.     methocarbamol (ROBAXIN) 500 MG tablet Take 500 mg by mouth every 6 (six) hours as needed.     Misc Natural Products (BEET ROOT PO) Take 1 capsule by mouth daily.     Multiple Vitamin (MULTI-VITAMIN) tablet Take 1 tablet by mouth daily.     rivastigmine (EXELON) 1.5 MG capsule Take 1.5 mg by mouth 2 (two) times daily.     rosuvastatin  (CRESTOR ) 10 MG tablet Take 1 tablet (10 mg total) by mouth daily. 90 tablet 1   rosuvastatin  (CRESTOR ) 10 MG tablet Take 1 tablet (10 mg total) by mouth daily. 30 tablet 0   No current facility-administered medications for this visit.    No Known Allergies  Social History   Socioeconomic History   Marital status: Married    Spouse name: Not on file   Number of children: 3   Years of education: Not on file  Highest education level: Not on file  Occupational History   Occupation: Retired-Mechanic  Tobacco Use   Smoking status: Former    Types: Cigarettes   Smokeless tobacco: Never  Vaping Use   Vaping status: Never Used  Substance and Sexual Activity   Alcohol use: No    Alcohol/week: 0.0 standard drinks of alcohol   Drug use: No   Sexual activity: Not Currently  Other Topics Concern   Not on file  Social History Narrative   Married   Social Drivers of Health   Financial Resource Strain: Low Risk  (12/19/2023)   Received from [facility]   Overall Financial Resource Strain (CARDIA)    Difficulty of Paying Living Expenses: Not hard at all  Food Insecurity: No Food Insecurity (12/19/2023)   Received from [facility]   Hunger Vital Sign    Within the past 12 months, you worried that your food would run out before you got the money to buy more.: Never true    Within the past 12 months, the food you bought just didn't last and you didn't have money to get more.: Never true  Transportation Needs: No Transportation Needs (12/19/2023)   Received from [facility] - Transportation    In the past 12 months, has lack of transportation kept you from medical appointments or from getting medications?: No    Lack of Transportation (Non-Medical): No  Physical Activity: Inactive (08/20/2023)   Exercise Vital Sign    Days of Exercise per Week: 0 days    Minutes of Exercise per Session: 0 min  Stress: No Stress Concern Present (08/20/2023)   [employer] of Occupational Health - Occupational Stress Questionnaire    Feeling of Stress : Not at all  Social Connections: Moderately Integrated (08/20/2023)   Social Connection and Isolation Panel    Frequency of Communication with Friends and Family: More than three times a week    Frequency of Social Gatherings with Friends and Family: Three times a week    Attends Religious Services: More than 4 times per year    Active Member of Clubs or Organizations: No    Attends Banker Meetings: Never    Marital Status: Married  Catering manager Violence: Not At Risk (08/20/2023)   Humiliation, Afraid, Rape, and Kick questionnaire    Fear of Current or Ex-Partner: No    Emotionally Abused: No    Physically Abused: No    Sexually Abused: No    Family History  Problem Relation Age of Onset   Heart disease Father        Heart attack-54   Hypertension Father    Diabetes Maternal Grandmother     Review of Systems:  As stated in the HPI and otherwise negative.   BP 122/70   Pulse (!) 5   Ht 5' 10 (1.778 m)   Wt 168 lb 3.2 oz (76.3 kg)   SpO2 98%   BMI 24.13 kg/m   Physical Examination: General: Well  developed, well nourished, NAD  HEENT: OP clear, mucus membranes moist  SKIN: warm, dry. No rashes. Neuro: No focal deficits  Musculoskeletal: Muscle strength 5/5 all ext  Psychiatric: Mood and affect normal  Neck: No JVD, no carotid bruits, no thyromegaly, no lymphadenopathy.  Lungs:Clear bilaterally, no wheezes, rhonci, crackles Cardiovascular: Regular rate and rhythm. Systolic murmur.  Abdomen:Soft. Bowel sounds present. Non-tender.  Extremities: No lower extremity edema. Pulses are 2 + in  the bilateral DP/PT.  EKG:  EKG is not ordered today. The ekg ordered today demonstrates   Echo September 2025:  1. Left ventricular ejection fraction, by estimation, is 50 to 55%. The  left ventricle has low normal function. The left ventricle has no regional  wall motion abnormalities. There is mild left ventricular hypertrophy.  Left ventricular diastolic  parameters are consistent with Grade I diastolic dysfunction (impaired  relaxation). The average left ventricular global longitudinal strain is  -14.4 %. The global longitudinal strain is abnormal.   2. Right ventricular systolic function is normal. The right ventricular  size is normal.   3. Left atrial size was mildly dilated.   4. The mitral valve is normal in structure. Mild mitral valve  regurgitation.   5. Mean Aov gradient 40, PG 62, Vmax 3.39m/s, AVA 0.6 cm2, DVI 0.18. The  aortic valve is calcified. Aortic valve regurgitation is mild. Severe  aortic valve stenosis.   6. The inferior vena cava is normal in size with greater than 50%  respiratory variability, suggesting right atrial pressure of 3 mmHg.   FINDINGS   Left Ventricle: Increase in Mean AV gradient from 32 mmHg to 40 mmHg.  Left ventricular ejection fraction, by estimation, is 50 to 55%. The left  ventricle has low normal function. The left ventricle has no regional wall  motion abnormalities. The average  left ventricular global longitudinal strain is -14.4 %. Strain  was  performed and the global longitudinal strain is abnormal. The left  ventricular internal cavity size was normal in size. There is mild left  ventricular hypertrophy. Left ventricular  diastolic parameters are consistent with Grade I diastolic dysfunction  (impaired relaxation).   Right Ventricle: The right ventricular size is normal. No increase in  right ventricular wall thickness. Right ventricular systolic function is  normal.   Left Atrium: Left atrial size was mildly dilated.   Right Atrium: Right atrial size was normal in size.   Pericardium: There is no evidence of pericardial effusion.   Mitral Valve: The mitral valve is normal in structure. Mild mitral valve  regurgitation.   Tricuspid Valve: The tricuspid valve is normal in structure. Tricuspid  valve regurgitation is mild.   Aortic Valve: Mean Aov gradient 40, PG 62, Vmax 3.84m/s, AVA 0.6 cm2, DVI  0.18. The aortic valve is calcified. Aortic valve regurgitation is mild.  Severe aortic stenosis is present. Aortic valve mean gradient measures  39.5 mmHg. Aortic valve peak gradient   measures 59.6 mmHg. Aortic valve area, by VTI measures 0.51 cm.   Pulmonic Valve: The pulmonic valve was not well visualized. Pulmonic valve  regurgitation is not visualized.   Aorta: The aortic root is normal in size and structure.   Venous: The inferior vena cava is normal in size with greater than 50%  respiratory variability, suggesting right atrial pressure of 3 mmHg.   IAS/Shunts: No atrial level shunt detected by color flow Doppler.     LEFT VENTRICLE  PLAX 2D  LVIDd:         4.23 cm   Diastology  LVIDs:         2.94 cm   LV e' medial:    5.98 cm/s  LV PW:         1.03 cm   LV E/e' medial:  15.9  LV IVS:        1.07 cm   LV e' lateral:   6.42 cm/s  LVOT diam:     1.90  cm   LV E/e' lateral: 14.8  LV SV:         48  LV SV Index:   25        2D Longitudinal Strain  LVOT Area:     2.84 cm  2D Strain GLS Avg:     -14.4 %      RIGHT VENTRICLE  RV Basal diam:  3.15 cm  RV Mid diam:    2.26 cm  RV S prime:     15.60 cm/s  TAPSE (M-mode): 2.5 cm   LEFT ATRIUM             Index        RIGHT ATRIUM           Index  LA diam:        4.30 cm 2.23 cm/m   RA Area:     15.30 cm  LA Vol (A2C):   59.5 ml 30.85 ml/m  RA Volume:   38.10 ml  19.76 ml/m  LA Vol (A4C):   64.9 ml 33.65 ml/m  LA Biplane Vol: 62.3 ml 32.31 ml/m   AORTIC VALVE  AV Area (Vmax):    0.61 cm  AV Area (Vmean):   0.55 cm  AV Area (VTI):     0.51 cm  AV Vmax:           386.00 cm/s  AV Vmean:          296.000 cm/s  AV VTI:            0.936 m  AV Peak Grad:      59.6 mmHg  AV Mean Grad:      39.5 mmHg  LVOT Vmax:         82.50 cm/s  LVOT Vmean:        Donald.900 cm/s  LVOT VTI:          0.170 m  LVOT/AV VTI ratio: 0.18    AORTA  Ao Sinus diam: 3.18 cm   MITRAL VALVE  MV Area (PHT): 4.39 cm     SHUNTS  MV Decel Time: 173 msec     Systemic VTI:  0.17 m  MV E velocity: 94.90 cm/s   Systemic Diam: 1.90 cm  MV A velocity: 167.00 cm/s  MV E/A ratio:  0.Donald   Recent Labs: 09/25/2023: TSH 0.88 03/12/2024: ALT 17; BUN 23; Creatinine, Ser 1.07; Hemoglobin 14.1; Platelets 188; Sodium 139 04/01/2024: Potassium 4.8    Wt Readings from Last 3 Encounters:  06/08/24 168 lb 3.2 oz (76.3 kg)  03/12/24 166 lb (75.3 kg)  12/10/23 167 lb (75.8 kg)    Assessment and Plan:   1. Severe Aortic Valve Stenosis: He has severe AS. NYHA class 1. I have personally reviewed his recent echo images. The AV is thickened and calcified. He would be a candidate for surgical AVR or TAVR but given advanced age I would favor TAVR. He is completely asymptomatic at this time. Will follow AS for now. I have told him that we would most likely moved forward with TAVR workup over the next 6-12 months.    I have reviewed the natural history of aortic stenosis with the patient and their family members  who are present today. We have discussed the limitations of medical therapy  and the poor prognosis associated with symptomatic aortic stenosis. We have reviewed potential treatment options, including palliative medical therapy, conventional surgical aortic valve replacement, and transcatheter aortic valve replacement. We discussed treatment  options in the context of the patient's specific comorbid medical conditions.   Will repeat echo in six months.    Labs/ tests ordered today include:   Orders Placed This Encounter  Procedures   ECHOCARDIOGRAM COMPLETE   Disposition:   F/U with me in 6  months.   Signed, Lonni Cash, MD, [facility] 06/08/2024 11:11 AM    [facility] [address] [place], [place], [place]  [address] Phone: [phone]; Fax: [phone]

## 2024-06-10 DIAGNOSIS — M4804 Spinal stenosis, thoracic region: Secondary | ICD-10-CM | POA: Diagnosis not present

## 2024-06-10 DIAGNOSIS — M5416 Radiculopathy, lumbar region: Secondary | ICD-10-CM | POA: Diagnosis not present

## 2024-06-10 DIAGNOSIS — M5414 Radiculopathy, thoracic region: Secondary | ICD-10-CM | POA: Diagnosis not present

## 2024-06-10 DIAGNOSIS — M47816 Spondylosis without myelopathy or radiculopathy, lumbar region: Secondary | ICD-10-CM | POA: Diagnosis not present

## 2024-06-10 DIAGNOSIS — M48062 Spinal stenosis, lumbar region with neurogenic claudication: Secondary | ICD-10-CM | POA: Diagnosis not present

## 2024-06-11 DIAGNOSIS — M545 Low back pain, unspecified: Secondary | ICD-10-CM | POA: Diagnosis not present

## 2024-06-15 DIAGNOSIS — M545 Low back pain, unspecified: Secondary | ICD-10-CM | POA: Diagnosis not present

## 2024-06-17 DIAGNOSIS — M545 Low back pain, unspecified: Secondary | ICD-10-CM | POA: Diagnosis not present

## 2024-06-22 DIAGNOSIS — M545 Low back pain, unspecified: Secondary | ICD-10-CM | POA: Diagnosis not present

## 2024-06-23 DIAGNOSIS — M47816 Spondylosis without myelopathy or radiculopathy, lumbar region: Secondary | ICD-10-CM | POA: Diagnosis not present

## 2024-06-29 DIAGNOSIS — M545 Low back pain, unspecified: Secondary | ICD-10-CM | POA: Diagnosis not present

## 2024-06-30 DIAGNOSIS — M5414 Radiculopathy, thoracic region: Secondary | ICD-10-CM | POA: Diagnosis not present

## 2024-06-30 DIAGNOSIS — M48062 Spinal stenosis, lumbar region with neurogenic claudication: Secondary | ICD-10-CM | POA: Diagnosis not present

## 2024-06-30 DIAGNOSIS — M5416 Radiculopathy, lumbar region: Secondary | ICD-10-CM | POA: Diagnosis not present

## 2024-06-30 DIAGNOSIS — M4804 Spinal stenosis, thoracic region: Secondary | ICD-10-CM | POA: Diagnosis not present

## 2024-06-30 DIAGNOSIS — M47816 Spondylosis without myelopathy or radiculopathy, lumbar region: Secondary | ICD-10-CM | POA: Diagnosis not present

## 2024-07-01 DIAGNOSIS — M545 Low back pain, unspecified: Secondary | ICD-10-CM | POA: Diagnosis not present

## 2024-07-03 ENCOUNTER — Emergency Department

## 2024-07-03 ENCOUNTER — Encounter: Payer: Self-pay | Admitting: Family Medicine

## 2024-07-03 ENCOUNTER — Inpatient Hospital Stay
Admission: EM | Admit: 2024-07-03 | Discharge: 2024-07-15 | DRG: 871 | Disposition: A | Discharge status: Skilled Nursing Facility | Attending: Internal Medicine | Admitting: Internal Medicine

## 2024-07-03 ENCOUNTER — Other Ambulatory Visit: Payer: Self-pay

## 2024-07-03 DIAGNOSIS — Z7982 Long term (current) use of aspirin: Secondary | ICD-10-CM | POA: Diagnosis not present

## 2024-07-03 DIAGNOSIS — I214 Non-ST elevation (NSTEMI) myocardial infarction: Secondary | ICD-10-CM | POA: Diagnosis present

## 2024-07-03 DIAGNOSIS — Z8249 Family history of ischemic heart disease and other diseases of the circulatory system: Secondary | ICD-10-CM

## 2024-07-03 DIAGNOSIS — I420 Dilated cardiomyopathy: Secondary | ICD-10-CM | POA: Diagnosis present

## 2024-07-03 DIAGNOSIS — Z79899 Other long term (current) drug therapy: Secondary | ICD-10-CM

## 2024-07-03 DIAGNOSIS — Z794 Long term (current) use of insulin: Secondary | ICD-10-CM

## 2024-07-03 DIAGNOSIS — M069 Rheumatoid arthritis, unspecified: Secondary | ICD-10-CM | POA: Diagnosis present

## 2024-07-03 DIAGNOSIS — Z789 Other specified health status: Secondary | ICD-10-CM | POA: Diagnosis not present

## 2024-07-03 DIAGNOSIS — A419 Sepsis, unspecified organism: Principal | ICD-10-CM

## 2024-07-03 DIAGNOSIS — G9341 Metabolic encephalopathy: Secondary | ICD-10-CM | POA: Diagnosis present

## 2024-07-03 DIAGNOSIS — I4891 Unspecified atrial fibrillation: Secondary | ICD-10-CM | POA: Diagnosis not present

## 2024-07-03 DIAGNOSIS — E538 Deficiency of other specified B group vitamins: Secondary | ICD-10-CM | POA: Diagnosis present

## 2024-07-03 DIAGNOSIS — Z515 Encounter for palliative care: Secondary | ICD-10-CM | POA: Diagnosis not present

## 2024-07-03 DIAGNOSIS — Z7401 Bed confinement status: Secondary | ICD-10-CM | POA: Diagnosis not present

## 2024-07-03 DIAGNOSIS — I480 Paroxysmal atrial fibrillation: Secondary | ICD-10-CM | POA: Diagnosis present

## 2024-07-03 DIAGNOSIS — J90 Pleural effusion, not elsewhere classified: Secondary | ICD-10-CM | POA: Diagnosis not present

## 2024-07-03 DIAGNOSIS — I447 Left bundle-branch block, unspecified: Secondary | ICD-10-CM | POA: Diagnosis not present

## 2024-07-03 DIAGNOSIS — I452 Bifascicular block: Secondary | ICD-10-CM | POA: Diagnosis present

## 2024-07-03 DIAGNOSIS — R051 Acute cough: Secondary | ICD-10-CM | POA: Diagnosis present

## 2024-07-03 DIAGNOSIS — R531 Weakness: Secondary | ICD-10-CM

## 2024-07-03 DIAGNOSIS — Z833 Family history of diabetes mellitus: Secondary | ICD-10-CM | POA: Diagnosis not present

## 2024-07-03 DIAGNOSIS — F0390 Unspecified dementia without behavioral disturbance: Secondary | ICD-10-CM | POA: Diagnosis not present

## 2024-07-03 DIAGNOSIS — J189 Pneumonia, unspecified organism: Secondary | ICD-10-CM | POA: Diagnosis present

## 2024-07-03 DIAGNOSIS — I350 Nonrheumatic aortic (valve) stenosis: Secondary | ICD-10-CM | POA: Diagnosis present

## 2024-07-03 DIAGNOSIS — I5022 Chronic systolic (congestive) heart failure: Secondary | ICD-10-CM | POA: Diagnosis present

## 2024-07-03 DIAGNOSIS — E119 Type 2 diabetes mellitus without complications: Secondary | ICD-10-CM | POA: Diagnosis present

## 2024-07-03 DIAGNOSIS — Z0389 Encounter for observation for other suspected diseases and conditions ruled out: Secondary | ICD-10-CM | POA: Diagnosis not present

## 2024-07-03 DIAGNOSIS — R5381 Other malaise: Secondary | ICD-10-CM | POA: Diagnosis present

## 2024-07-03 DIAGNOSIS — I82611 Acute embolism and thrombosis of superficial veins of right upper extremity: Secondary | ICD-10-CM | POA: Diagnosis not present

## 2024-07-03 DIAGNOSIS — R718 Other abnormality of red blood cells: Secondary | ICD-10-CM | POA: Diagnosis present

## 2024-07-03 DIAGNOSIS — I361 Nonrheumatic tricuspid (valve) insufficiency: Secondary | ICD-10-CM | POA: Diagnosis not present

## 2024-07-03 DIAGNOSIS — F0393 Unspecified dementia, unspecified severity, with mood disturbance: Secondary | ICD-10-CM | POA: Diagnosis present

## 2024-07-03 DIAGNOSIS — E785 Hyperlipidemia, unspecified: Secondary | ICD-10-CM | POA: Diagnosis present

## 2024-07-03 DIAGNOSIS — I808 Phlebitis and thrombophlebitis of other sites: Secondary | ICD-10-CM | POA: Diagnosis not present

## 2024-07-03 DIAGNOSIS — R6520 Severe sepsis without septic shock: Secondary | ICD-10-CM | POA: Diagnosis not present

## 2024-07-03 DIAGNOSIS — U071 COVID-19: Secondary | ICD-10-CM | POA: Diagnosis present

## 2024-07-03 DIAGNOSIS — R918 Other nonspecific abnormal finding of lung field: Secondary | ICD-10-CM | POA: Diagnosis not present

## 2024-07-03 DIAGNOSIS — R0682 Tachypnea, not elsewhere classified: Secondary | ICD-10-CM

## 2024-07-03 DIAGNOSIS — F03918 Unspecified dementia, unspecified severity, with other behavioral disturbance: Secondary | ICD-10-CM | POA: Diagnosis present

## 2024-07-03 DIAGNOSIS — F320 Major depressive disorder, single episode, mild: Secondary | ICD-10-CM | POA: Diagnosis present

## 2024-07-03 DIAGNOSIS — E875 Hyperkalemia: Secondary | ICD-10-CM | POA: Diagnosis present

## 2024-07-03 DIAGNOSIS — I5020 Unspecified systolic (congestive) heart failure: Secondary | ICD-10-CM | POA: Diagnosis not present

## 2024-07-03 DIAGNOSIS — I4892 Unspecified atrial flutter: Secondary | ICD-10-CM | POA: Diagnosis present

## 2024-07-03 DIAGNOSIS — A4189 Other specified sepsis: Secondary | ICD-10-CM | POA: Diagnosis present

## 2024-07-03 DIAGNOSIS — Z7901 Long term (current) use of anticoagulants: Secondary | ICD-10-CM

## 2024-07-03 DIAGNOSIS — I700 Atherosclerosis of aorta: Secondary | ICD-10-CM | POA: Diagnosis not present

## 2024-07-03 DIAGNOSIS — R000 Tachycardia, unspecified: Secondary | ICD-10-CM | POA: Diagnosis not present

## 2024-07-03 DIAGNOSIS — I428 Other cardiomyopathies: Secondary | ICD-10-CM | POA: Diagnosis not present

## 2024-07-03 DIAGNOSIS — Z7189 Other specified counseling: Secondary | ICD-10-CM | POA: Diagnosis not present

## 2024-07-03 DIAGNOSIS — Z28310 Unvaccinated for covid-19: Secondary | ICD-10-CM

## 2024-07-03 DIAGNOSIS — R0902 Hypoxemia: Secondary | ICD-10-CM | POA: Diagnosis present

## 2024-07-03 DIAGNOSIS — I517 Cardiomegaly: Secondary | ICD-10-CM | POA: Diagnosis not present

## 2024-07-03 DIAGNOSIS — J811 Chronic pulmonary edema: Secondary | ICD-10-CM | POA: Diagnosis not present

## 2024-07-03 DIAGNOSIS — Z87891 Personal history of nicotine dependence: Secondary | ICD-10-CM

## 2024-07-03 HISTORY — DX: Unspecified hemorrhoids: K64.9

## 2024-07-03 HISTORY — DX: Nonrheumatic aortic (valve) stenosis: I35.0

## 2024-07-03 LAB — RESP PANEL BY RT-PCR (RSV, FLU A&B, COVID)  RVPGX2
Influenza A by PCR: NEGATIVE
Influenza B by PCR: NEGATIVE
Resp Syncytial Virus by PCR: NEGATIVE
SARS Coronavirus 2 by RT PCR: POSITIVE — AB

## 2024-07-03 LAB — URINALYSIS, W/ REFLEX TO CULTURE (INFECTION SUSPECTED)
Bacteria, UA: NONE SEEN
Bilirubin Urine: NEGATIVE
Glucose, UA: 50 mg/dL — AB
Hgb urine dipstick: NEGATIVE
Ketones, ur: 5 mg/dL — AB
Leukocytes,Ua: NEGATIVE
Nitrite: NEGATIVE
Protein, ur: 30 mg/dL — AB
Specific Gravity, Urine: 1.029 (ref 1.005–1.030)
Squamous Epithelial / HPF: 0 /HPF (ref 0–5)
pH: 5 (ref 5.0–8.0)

## 2024-07-03 LAB — BASIC METABOLIC PANEL WITH GFR
Anion gap: 15 (ref 5–15)
BUN: 23 mg/dL (ref 8–23)
CO2: 21 mmol/L — ABNORMAL LOW (ref 22–32)
Calcium: 8.7 mg/dL — ABNORMAL LOW (ref 8.9–10.3)
Chloride: 101 mmol/L (ref 98–111)
Creatinine, Ser: 1.24 mg/dL (ref 0.61–1.24)
GFR, Estimated: >60 mL/min (ref >60)
Glucose, Bld: 150 mg/dL — ABNORMAL HIGH (ref 70–99)
Potassium: 4.5 mmol/L (ref 3.5–5.1)
Sodium: 137 mmol/L (ref 135–145)

## 2024-07-03 LAB — CBC
HCT: 37.8 % — ABNORMAL LOW (ref 39.0–52.0)
Hemoglobin: 12.5 g/dL — ABNORMAL LOW (ref 13.0–17.0)
MCH: 33.9 pg (ref 26.0–34.0)
MCHC: 33.1 g/dL (ref 30.0–36.0)
MCV: 102.4 fL — ABNORMAL HIGH (ref 80.0–100.0)
Platelets: 127 K/uL — ABNORMAL LOW (ref 150–400)
RBC: 3.69 MIL/uL — ABNORMAL LOW (ref 4.22–5.81)
RDW: 11.9 % (ref 11.5–15.5)
WBC: 6.1 K/uL (ref 4.0–10.5)
nRBC: 0 % (ref 0.0–0.2)

## 2024-07-03 LAB — CBG MONITORING, ED
Glucose-Capillary: 145 mg/dL — ABNORMAL HIGH (ref 70–99)
Glucose-Capillary: 162 mg/dL — ABNORMAL HIGH (ref 70–99)

## 2024-07-03 LAB — LACTIC ACID, PLASMA
Lactic Acid, Venous: 2.3 mmol/L (ref 0.5–1.9)
Lactic Acid, Venous: 1.4 mmol/L (ref 0.5–1.9)

## 2024-07-03 LAB — PROTIME-INR
INR: 1 (ref 0.8–1.2)
Prothrombin Time: 14.2 s (ref 11.4–15.2)

## 2024-07-03 MED ORDER — ONDANSETRON HCL 4 MG PO TABS: 4.0000 mg | ORAL_TABLET | Freq: Four times a day (QID) | ORAL | Status: DC | PRN

## 2024-07-03 MED ORDER — RIVASTIGMINE TARTRATE 1.5 MG PO CAPS
1.5000 mg | ORAL_CAPSULE | Freq: Two times a day (BID) | ORAL | Status: DC
  Administered 2024-07-03 – 2024-07-15 (×24): 1.5 mg via ORAL
  Filled 2024-07-03 (×27): qty 1

## 2024-07-03 MED ORDER — MAGNESIUM HYDROXIDE 400 MG/5ML PO SUSP: 30.0000 mL | Freq: Every day | ORAL | Status: DC | PRN

## 2024-07-03 MED ORDER — OYSTER SHELL CALCIUM/D3 500-5 MG-MCG PO TABS
1.0000 | ORAL_TABLET | Freq: Every day | ORAL | Status: DC
Start: 2024-07-04 — End: 2024-07-04
  Administered 2024-07-04: 1 via ORAL
  Filled 2024-07-03: qty 1

## 2024-07-03 MED ORDER — MAGNESIUM OXIDE -MG SUPPLEMENT 400 (240 MG) MG PO TABS
400.0000 mg | ORAL_TABLET | Freq: Every day | ORAL | Status: DC
  Administered 2024-07-04 – 2024-07-15 (×12): 400 mg via ORAL
  Filled 2024-07-03 (×12): qty 1

## 2024-07-03 MED ORDER — LACTATED RINGERS IV BOLUS (SEPSIS)
1000.0000 mL | Freq: Once | INTRAVENOUS | Status: AC
  Administered 2024-07-03: 1000 mL via INTRAVENOUS

## 2024-07-03 MED ORDER — ONDANSETRON HCL 4 MG/2ML IJ SOLN: 4.0000 mg | Freq: Four times a day (QID) | INTRAMUSCULAR | Status: DC | PRN

## 2024-07-03 MED ORDER — ROSUVASTATIN CALCIUM 10 MG PO TABS
10.0000 mg | ORAL_TABLET | Freq: Every day | ORAL | Status: DC
  Administered 2024-07-04 – 2024-07-15 (×12): 10 mg via ORAL
  Filled 2024-07-03 (×13): qty 1

## 2024-07-03 MED ORDER — COQ10 100 MG PO CAPS: 100.0000 mg | ORAL_CAPSULE | Freq: Every day | ORAL | Status: DC

## 2024-07-03 MED ORDER — ENOXAPARIN SODIUM 40 MG/0.4ML IJ SOSY
40.0000 mg | PREFILLED_SYRINGE | INTRAMUSCULAR | Status: DC
  Administered 2024-07-03: 40 mg via SUBCUTANEOUS
  Filled 2024-07-03: qty 0.4

## 2024-07-03 MED ORDER — L-LYSINE 500 MG PO CAPS: 500.0000 mg | ORAL_CAPSULE | Freq: Every day | ORAL | Status: DC

## 2024-07-03 MED ORDER — INSULIN ASPART 100 UNIT/ML IJ SOLN
0.0000 [IU] | Freq: Three times a day (TID) | INTRAMUSCULAR | Status: DC
  Administered 2024-07-04 – 2024-07-05 (×3): 2 [IU] via SUBCUTANEOUS
  Administered 2024-07-05: 5 [IU] via SUBCUTANEOUS
  Administered 2024-07-05: 3 [IU] via SUBCUTANEOUS
  Administered 2024-07-06 – 2024-07-07 (×2): 2 [IU] via SUBCUTANEOUS
  Administered 2024-07-07: 3 [IU] via SUBCUTANEOUS
  Administered 2024-07-08 (×2): 2 [IU] via SUBCUTANEOUS
  Administered 2024-07-08: 3 [IU] via SUBCUTANEOUS
  Administered 2024-07-09: 2 [IU] via SUBCUTANEOUS
  Administered 2024-07-09: 5 [IU] via SUBCUTANEOUS
  Administered 2024-07-09 – 2024-07-11 (×3): 3 [IU] via SUBCUTANEOUS
  Administered 2024-07-12: 2 [IU] via SUBCUTANEOUS
  Administered 2024-07-13: 3 [IU] via SUBCUTANEOUS
  Administered 2024-07-13 – 2024-07-14 (×3): 2 [IU] via SUBCUTANEOUS
  Administered 2024-07-15: 3 [IU] via SUBCUTANEOUS
  Filled 2024-07-03 (×20): qty 1

## 2024-07-03 MED ORDER — ADULT MULTIVITAMIN W/MINERALS CH
1.0000 | ORAL_TABLET | Freq: Every day | ORAL | Status: DC
  Administered 2024-07-04 – 2024-07-15 (×12): 1 via ORAL
  Filled 2024-07-03 (×12): qty 1

## 2024-07-03 MED ORDER — INSULIN ASPART 100 UNIT/ML IJ SOLN
0.0000 [IU] | Freq: Every day | INTRAMUSCULAR | Status: DC
  Administered 2024-07-05: 2 [IU] via SUBCUTANEOUS
  Filled 2024-07-03 (×2): qty 1

## 2024-07-03 MED ORDER — SODIUM CHLORIDE 0.9 % IV SOLN
1.0000 g | Freq: Once | INTRAVENOUS | Status: AC
  Administered 2024-07-03: 1 g via INTRAVENOUS
  Filled 2024-07-03: qty 10

## 2024-07-03 MED ORDER — ACETAMINOPHEN 650 MG RE SUPP: 650.0000 mg | Freq: Four times a day (QID) | RECTAL | Status: DC | PRN

## 2024-07-03 MED ORDER — LORATADINE 10 MG PO TABS
10.0000 mg | ORAL_TABLET | Freq: Every day | ORAL | Status: DC
  Administered 2024-07-04 – 2024-07-15 (×12): 10 mg via ORAL
  Filled 2024-07-03 (×12): qty 1

## 2024-07-03 MED ORDER — TRAZODONE HCL 50 MG PO TABS: 25.0000 mg | ORAL_TABLET | Freq: Every evening | ORAL | Status: DC | PRN

## 2024-07-03 MED ORDER — ACETAMINOPHEN 325 MG PO TABS
650.0000 mg | ORAL_TABLET | Freq: Four times a day (QID) | ORAL | Status: DC | PRN
  Administered 2024-07-04 – 2024-07-14 (×5): 650 mg via ORAL
  Filled 2024-07-03 (×5): qty 2

## 2024-07-03 MED ORDER — VITAMIN B-12 1000 MCG PO TABS
1000.0000 ug | ORAL_TABLET | Freq: Every day | ORAL | Status: DC
  Administered 2024-07-04 – 2024-07-15 (×12): 1000 ug via ORAL
  Filled 2024-07-03 (×12): qty 1

## 2024-07-03 MED ORDER — ACETAMINOPHEN 500 MG PO TABS
1000.0000 mg | ORAL_TABLET | Freq: Once | ORAL | Status: AC
  Administered 2024-07-03: 1000 mg via ORAL
  Filled 2024-07-03: qty 2

## 2024-07-03 MED ORDER — SODIUM CHLORIDE 0.9 % IV SOLN: INTRAVENOUS | Status: DC

## 2024-07-03 MED ORDER — APPLE CIDER VINEGAR 500 MG PO TABS: ORAL_TABLET | Freq: Every day | ORAL | Status: DC

## 2024-07-03 NOTE — Progress Notes (Signed)
   07/03/24 1630  Spiritual Encounters  Type of Visit Initial  Care provided to: Pt and family  Conversation partners present during encounter Nurse  Referral source Chaplain assessment  Reason for visit Routine spiritual support  Interventions  Spiritual Care Interventions Made Established relationship of care and support;Compassionate presence;Normalization of emotions;Encouragement  Intervention Outcomes  Outcomes Connection to spiritual care;Awareness of support  Spiritual Care Plan  Spiritual Care Issues Still Outstanding No further spiritual care needs at this time (see row info)   Chaplain Sari helped family get pt into a wheelchair and got pt a blanket while waiting for triage. Chaplain showed compassionate presence and encouragement.

## 2024-07-03 NOTE — ED Triage Notes (Addendum)
 C/o generalized weakness and gait instability with family that started this AM. Pt reports cough, congestion. Pt denies SOB, CP, dizziness, vision changes, fevers. GCS 15. PMH: aortic valve stenosis, DM type 2.

## 2024-07-03 NOTE — Progress Notes (Signed)
   07/03/24 2115  Spiritual Encounters  Type of Visit Initial  Care provided to: Pt and family  Referral source Chaplain team  Reason for visit Routine spiritual support  OnCall Visit Yes   Met family when patient roomed in the ED.  Provided compassionate support and reassurance to patient, wife, and grandson.

## 2024-07-03 NOTE — Assessment & Plan Note (Addendum)
 Hemoglobin A1c low at 5.7.  Patient on sliding scale insulin

## 2024-07-03 NOTE — Assessment & Plan Note (Addendum)
 Continue vitamin B12

## 2024-07-03 NOTE — H&P (Addendum)
 [place]   PATIENT NAME: [name]    MR#:  [id number]  DATE OF BIRTH:  1945-09-28  DATE OF ADMISSION:  07/03/2024  PRIMARY CARE PHYSICIAN: Glendia Shad, MD   Patient is coming from: Home  REQUESTING/REFERRING PHYSICIAN: Waymond Lorelle Cummins, MD  CHIEF COMPLAINT:   Chief Complaint  Patient presents with   Weakness    HISTORY OF PRESENT ILLNESS:  [name] is a 78 y.o. Caucasian male with medical history significant for type 2 diabetes mellitus, dyslipidemia, rheumatoid arthritis and aortic stenosis, who presented to the emergency room with acute onset of generalized weakness.  He has been having cough today with no dyspnea or wheezing.  He denied any chest pain or palpitations.  No fever or chills.  No nausea or vomiting or diarrhea or abdominal pain.  No dysuria,, urinary frequency or urgency, hematuria or flank pain.  He denied any headache or dizziness or blurred vision.  No paresthesias or focal muscle weakness.  He was able to mow his lawn yesterday however today he felt so weak he had to be assisted to standing.  No recent sick exposure.  He has been having significant diminished p.o. intake.  The patient denied any loss of taste or smell  ED Course: When he came to the ER, temperature was 99.1 and later 100.7, heart rate was 103 with otherwise normal vital signs.  Respiratory rate was normal and later 29.  BMP showed CO2 of 21 and blood glucose of 150 with calcium  of 8.7 and.  Lactic acid was 2.3 and later 1.4 and CBC showed hemoglobin 12.5 and hematocrit 37.8 with microcytosis.  PT and INR were normal.  Urinalysis showed 30 protein and 50 glucose and was otherwise unremarkable.  Respiratory panel came back positive for COVID-19.  Blood cultures were sent. EKG as reviewed by me :  EKG showed sinus tachycardia with a rate of 106 with nonspecific intraventricular conduction delay and Q waves anteroseptally with T wave inversion inferiorly.  Previous EKG on 11/15/2023 was  normal Imaging: Portable chest x-ray showed no acute cardiopulmonary disease.  The patient was given 1 g p.o. Tylenol  and 1 g of IV Rocephin and 1 L bolus of IV lactated Ringer.  He will be admitted to a medical telemetry observation bed for further evaluation and management. PAST MEDICAL HISTORY:   Past Medical History:  Diagnosis Date   Aortic stenosis    Carpal tunnel syndrome    Diabetes mellitus without complication (HCC)    Hyperlipidemia    Nephrolithiasis    H/O stones   Rheumatoid arthritis (HCC)     PAST SURGICAL HISTORY:   Past Surgical History:  Procedure Laterality Date   APPENDECTOMY  1962   CARPAL TUNNEL RELEASE Bilateral    hands    SOCIAL HISTORY:   Social History   Tobacco Use   Smoking status: Former    Types: Cigarettes   Smokeless tobacco: Never  Substance Use Topics   Alcohol use: No    Alcohol/week: 0.0 standard drinks of alcohol    FAMILY HISTORY:   Family History  Problem Relation Age of Onset   Heart disease Father        Heart attack-54   Hypertension Father    Diabetes Maternal Grandmother     DRUG ALLERGIES:  No Known Allergies  REVIEW OF SYSTEMS:   ROS As per history of present illness. All pertinent systems were reviewed above. Constitutional, HEENT, cardiovascular, respiratory, GI, GU, musculoskeletal,  neuro, psychiatric, endocrine, integumentary and hematologic systems were reviewed and are otherwise negative/unremarkable except for positive findings mentioned above in the HPI.   MEDICATIONS AT HOME:   Prior to Admission medications   Medication Sig Start Date End Date Taking? Authorizing Provider  ACCU-CHEK AVIVA PLUS test strip TEST TWO TIMES DAILY 05/01/21   Glendia Shad, MD  ACCU-CHEK SOFTCLIX LANCETS lancets TEST TWO TIMES DAILY 07/28/18   Glendia Shad, MD  APPLE CIDER VINEGAR PO Take 1 tablet by mouth daily.    [provider]  Calcium  Carb-Cholecalciferol (CALCIUM  + VITAMIN D3 PO) Take 2 each by  mouth daily.    [provider]  cetirizine (ZYRTEC) 10 MG tablet Take 10 mg by mouth daily as needed for allergies.    [provider]  Coenzyme Q10 (COQ10) 100 MG CAPS Take 100 mg by mouth daily.    [provider]  cyanocobalamin  (VITAMIN B12) 1000 MCG tablet Take 1,000 mcg by mouth daily.    [provider]  L-Lysine 500 MG CAPS Take 500 mg by mouth daily.    [provider]  leflunomide (ARAVA) 10 MG tablet Take 10 mg by mouth daily.  05/04/19   [provider]  magnesium oxide (MAG-OX) 400 (240 Mg) MG tablet Take 400 mg by mouth daily.    [provider]  methocarbamol (ROBAXIN) 500 MG tablet Take 500 mg by mouth every 6 (six) hours as needed. 05/12/24   [provider]  Misc Natural Products (BEET ROOT PO) Take 1 capsule by mouth daily.    [provider]  Multiple Vitamin (MULTI-VITAMIN) tablet Take 1 tablet by mouth daily.    [provider]  rivastigmine (EXELON) 1.5 MG capsule Take 1.5 mg by mouth 2 (two) times daily. 08/20/22   [provider]  rosuvastatin  (CRESTOR ) 10 MG tablet Take 1 tablet (10 mg total) by mouth daily. 03/18/24   Glendia Shad, MD  rosuvastatin  (CRESTOR ) 10 MG tablet Take 1 tablet (10 mg total) by mouth daily. 03/18/24   Glendia Shad, MD      VITAL SIGNS:  Blood pressure 116/64, pulse 98, temperature 99.4 F (37.4 C), temperature source Oral, resp. rate (!) 29, height 5' 10 (1.778 m), weight 77.1 kg, SpO2 100%.  PHYSICAL EXAMINATION:  Physical Exam  GENERAL:  78 y.o.-year-old patient lying in the bed with no acute distress.  EYES: Pupils equal, round, reactive to light and accommodation. No scleral icterus. Extraocular muscles intact.  HEENT: Head atraumatic, normocephalic. Oropharynx and nasopharynx clear.  NECK:  Supple, no jugular venous distention. No thyroid  enlargement, no tenderness.  LUNGS: Normal breath sounds bilaterally, no wheezing, rales,rhonchi or  crepitation. No use of accessory muscles of respiration.  CARDIOVASCULAR: Regular rate and rhythm, S1, S2 normal.  2/6 to 3/6 systolic ejection murmur at the left lower sternal border with no rubs, or gallops.  ABDOMEN: Soft, nondistended, nontender. Bowel sounds present. No organomegaly or mass.  EXTREMITIES: No pedal edema, cyanosis, or clubbing.  NEUROLOGIC: Cranial nerves II through XII are intact. Muscle strength 5/5 in all extremities. Sensation intact. Gait not checked.  PSYCHIATRIC: The patient is alert and oriented x 3.  Normal affect and good eye contact. SKIN: No obvious rash, lesion, or ulcer.   LABORATORY PANEL:   CBC Recent Labs  Lab 07/03/24 1629  WBC 6.1  HGB 12.5*  HCT 37.8*  PLT 127*   ------------------------------------------------------------------------------------------------------------------  Chemistries  Recent Labs  Lab 07/03/24 1629  NA 137  K 4.5  CL  101  CO2 21*  GLUCOSE 150*  BUN 23  CREATININE 1.24  CALCIUM  8.7*   ------------------------------------------------------------------------------------------------------------------  Cardiac Enzymes No results for input(s): TROPONINI in the last 168 hours. ------------------------------------------------------------------------------------------------------------------  RADIOLOGY:  DG Chest Port 1 View Result Date: 07/03/2024 EXAM: 1 VIEW(S) XRAY OF THE CHEST 07/03/2024 08:11:00 PM COMPARISON: 12/04/2016 CLINICAL HISTORY: Questionable sepsis - evaluate for abnormality. FINDINGS: LUNGS AND PLEURA: No focal pulmonary opacity. No pulmonary edema. No pleural effusion. No pneumothorax. HEART AND MEDIASTINUM: Aortic atherosclerosis. No acute abnormality of the cardiac and mediastinal silhouettes. BONES AND SOFT TISSUES: No acute osseous abnormality. IMPRESSION: 1. No acute cardiopulmonary process. Electronically signed by: Pinkie Pebbles MD 07/03/2024 08:17 PM EDT RP Workstation: [id number]       IMPRESSION AND PLAN:  Assessment and Plan: * Sepsis due to COVID-19 [facility]) - The patient will be admitted to the medical telemetry observation bed. - Will continue hydration with IV normal saline. - We will place the patient on vitamin C and zinc sulfate. - Mucolytic therapy will be provided with conservative management. - O2 protocol will be followed. - The patient has no current oxygen demand.  Dyslipidemia - Will continue statin therapy.  Rheumatoid arthritis (HCC) - Will continue Arava.  Controlled type 2 diabetes mellitus without complication, without long-term current use of insulin (HCC) - Will place the patient on supplemental coverage with NovoLog.  Vitamin B12 deficiency - Will continue vitamin B12  Dementia with behavioral disturbance (HCC) - Will continue Exelon.   DVT prophylaxis: Lovenox. Advanced Care Planning:  Code Status: full code. Family Communication:  The plan of care was discussed in details with the patient (and family). I answered all questions. The patient agreed to proceed with the above mentioned plan. Further management will depend upon hospital course. Disposition Plan: Back to previous home environment Consults called: none. All the records are reviewed and case discussed with ED provider.  Status is: Observation  I certify that at the time of admission, it is my clinical judgment that the patient will require  hospital care extending less than 2 midnights.                            Dispo: The patient is from: Home              Anticipated d/c is to: Home              Patient currently is not medically stable to d/c.              Difficult to place patient: No  Madison DELENA Peaches M.D on 07/03/2024 at 11:34 PM  [facility]   From 7 PM-7 AM, contact night-coverage [email or web address]  CC: Primary care physician; Glendia Shad, MD

## 2024-07-03 NOTE — ED Triage Notes (Signed)
 First nurse note: pt family reports pt woke up with generalized weakness and difficulty walking.

## 2024-07-03 NOTE — ED Provider Notes (Signed)
 [facility] Provider Note    Event Date/Time   First MD Initiated Contact with Patient 07/03/24 1925     (approximate)   History   Weakness   HPI  Donald [name] is a 78 y.o. male with history of diabetes, hyperlipidemia, RA, presenting with generalized weakness.  States that he has a cough since yesterday.  No shortness of breath or chest pain.  No fever.  He denies any nausea vomiting or diarrhea, no urinary symptoms.  He denies focal weakness or numbness.  Independent history from son, he was mowing the lawn yesterday, but today he was too weak to even stand on his own, typically ambulatory.  No other sick contacts.  He has also been having decreased p.o. intake.   On independent chart review, he was seen by cardiology in September, had an echo in 2025 that showed an EF of 60 to 65%.   Physical Exam   Triage Vital Signs: ED Triage Vitals  Encounter Vitals Group     BP 07/03/24 1608 131/75     Girls Systolic BP Percentile --      Girls Diastolic BP Percentile --      Boys Systolic BP Percentile --      Boys Diastolic BP Percentile --      Pulse Rate 07/03/24 1608 (!) 103     Resp 07/03/24 1608 18     Temp 07/03/24 1608 99.1 F (37.3 C)     Temp Source 07/03/24 1608 Oral     SpO2 07/03/24 1608 99 %     Weight 07/03/24 1613 170 lb (77.1 kg)     Height 07/03/24 1613 5' 10 (1.778 m)     Head Circumference --      Peak Flow --      Pain Score --      Pain Loc --      Pain Education --      Exclude from Growth Chart --     Most recent vital signs: Vitals:   07/03/24 1936 07/03/24 2000  BP: 124/73 113/66  Pulse: (!) 109 (!) 102  Resp: (!) 29 17  Temp:    SpO2: 100% 100%     General: Awake, no distress.  CV:  Good peripheral perfusion.  Resp:  Normal effort.  He is tachypneic Abd:  No distention.  Soft nontender Other:  No lower extremity edema, he has dry mucous membranes.  He has no facial droop, no focal weakness or  numbness.   ED Results / Procedures / Treatments   Labs (all labs ordered are listed, but only abnormal results are displayed) Labs Reviewed  RESP PANEL BY RT-PCR (RSV, FLU A&B, COVID)  RVPGX2 - Abnormal; Notable for the following components:      Result Value   SARS Coronavirus 2 by RT PCR POSITIVE (*)    All other components within normal limits  CBC - Abnormal; Notable for the following components:   RBC 3.69 (*)    Hemoglobin 12.5 (*)    HCT 37.8 (*)    MCV 102.4 (*)    Platelets 127 (*)    All other components within normal limits  BASIC METABOLIC PANEL WITH GFR - Abnormal; Notable for the following components:   CO2 21 (*)    Glucose, Bld 150 (*)    Calcium  8.7 (*)    All other components within normal limits  LACTIC ACID, PLASMA - Abnormal; Notable for the following components:  Lactic Acid, Venous 2.3 (*)    All other components within normal limits  URINALYSIS, W/ REFLEX TO CULTURE (INFECTION SUSPECTED) - Abnormal; Notable for the following components:   Color, Urine YELLOW (*)    APPearance CLEAR (*)    Glucose, UA 50 (*)    Ketones, ur 5 (*)    Protein, ur 30 (*)    All other components within normal limits  CBG MONITORING, ED - Abnormal; Notable for the following components:   Glucose-Capillary 145 (*)    All other components within normal limits  CULTURE, BLOOD (ROUTINE X 2)  CULTURE, BLOOD (ROUTINE X 2)  LACTIC ACID, PLASMA  PROTIME-INR     EKG  EKG shows, EKG shows sinus tachycardia, rate 106, widened QRS, T wave inversion to inferior leads, intraventricular conduction delay noted, no obvious ischemic ST elevation, this is changed compared to prior   RADIOLOGY On my independent interpretation, chest x-ray without obvious consolidation   PROCEDURES:  Critical Care performed: Yes, see critical care procedure note(s)  .Critical Care  Performed by: Waymond Lorelle Cummins, MD Authorized by: Waymond Lorelle Cummins, MD   Critical care provider statement:    Critical  care time (minutes):  40   Critical care was necessary to treat or prevent imminent or life-threatening deterioration of the following conditions:  Sepsis   Critical care was time spent personally by me on the following activities:  Development of treatment plan with patient or surrogate, discussions with consultants, evaluation of patient's response to treatment, examination of patient, ordering and review of laboratory studies, ordering and review of radiographic studies, ordering and performing treatments and interventions, pulse oximetry, re-evaluation of patient's condition and review of old charts    MEDICATIONS ORDERED IN ED: Medications  lactated ringers bolus 1,000 mL (1,000 mLs Intravenous New Bag/Given 07/03/24 1947)  cefTRIAXone (ROCEPHIN) 1 g in sodium chloride 0.9 % 100 mL IVPB (has no administration in time range)  acetaminophen  (TYLENOL ) tablet 1,000 mg (1,000 mg Oral Given 07/03/24 1947)     IMPRESSION / MDM / ASSESSMENT AND PLAN / ED COURSE  I reviewed the triage vital signs and the nursing notes.                              Differential diagnosis includes, but is not limited to, sepsis, viral illness, COVID, influenza, RSV, pneumonia, UTI, electrolyte derangements, dehydration, atypical ACS, arrhythmia.  Labs, EKG, troponin, chest x-ray, UA, blood cultures, IV fluids, IV antibiotics.    Patient's presentation is most consistent with acute presentation with potential threat to life or bodily function.  Independent interpretation of labs and imaging below.  Given his tachypnea, generalized weakness, dehydration, COVID-positive, meets criteria for sepsis.  He needs to be admitted for further management.  Consult to hospitalist will admit the patient.  He is admitted.  The patient is on the cardiac monitor to evaluate for evidence of arrhythmia and/or significant heart rate changes.   Clinical Course as of 07/03/24 2041  Fri Jul 03, 2024  [id number] Independent review of labs,  he is COVID-positive, lactate is mildly elevated, electrolytes not severely deranged, no leukocytosis. [TT]  2020 DG Chest Port 1 View 1. No acute cardiopulmonary process.  [TT]  2020 Urinalysis, w/ Reflex to Culture (Infection Suspected) -Urine, Clean Catch(!) UA not consistent with UTI. [TT]    Clinical Course User Index [TT] Waymond Lorelle Cummins, MD     FINAL CLINICAL IMPRESSION(S) / ED DIAGNOSES  Final diagnoses:  Sepsis, due to unspecified organism, unspecified whether acute organ dysfunction present (HCC)  COVID  Weakness  Tachypnea  Acute cough     Rx / DC Orders   ED Discharge Orders     None        Note:  This document was prepared using Dragon voice recognition software and may include unintentional dictation errors.    Waymond Lorelle Cummins, MD 07/03/24 [id number]

## 2024-07-03 NOTE — Assessment & Plan Note (Deleted)
 Continue Exelon.

## 2024-07-03 NOTE — Assessment & Plan Note (Addendum)
 Supportive care.  Gentle fluids.  Not sure if I would call the sepsis.

## 2024-07-03 NOTE — Progress Notes (Signed)
 PHARMACIST - PHYSICIAN ORDER COMMUNICATION  CONCERNING: P&T Medication Policy on Herbal Medications  DESCRIPTION:  This patient's order for:  Apple Cider Vinegar capsules, CoQ10 capsules, L-Lysine capsules  has been noted.  This product(s) is classified as an "herbal" or natural product. Due to a lack of definitive safety studies or FDA approval, nonstandard manufacturing practices, plus the potential risk of unknown drug-drug interactions while on inpatient medications, the Pharmacy and Therapeutics Committee does not permit the use of "herbal" or natural products of this type within [facility].   ACTION TAKEN: The pharmacy department is unable to verify this order at this time and your patient has been informed of this safety policy. Please reevaluate patient's clinical condition at discharge and address if the herbal or natural product(s) should be resumed at that time.   Alissa Pharr A Sidi Dzikowski, PharmD Clinical Pharmacist 07/03/2024 8:58 PM

## 2024-07-03 NOTE — Consult Note (Signed)
 CODE SEPSIS - PHARMACY COMMUNICATION  **Broad Spectrum Antibiotics should be administered within 1 hour of Sepsis diagnosis**  Time Code Sepsis Called/Page Received: 1932  Antibiotics Ordered: ceftriaxone  Time of 1st antibiotic administration: 2037  Additional action taken by pharmacy: reached out to provider as there were no antibiotics ordered. Reached out to nurse regarding delay in antibiotics being hung  If necessary, Name of Provider/Nurse Contacted: Lorelle Tan(Provider), Rachael Swartzfager(RN)    Donald [name] ,PharmD Clinical Pharmacist  07/03/2024  7:34 PM

## 2024-07-03 NOTE — Assessment & Plan Note (Addendum)
 On Crestor 

## 2024-07-03 NOTE — Sepsis Progress Note (Signed)
 Elink monitoring for the code sepsis protocol.

## 2024-07-03 NOTE — Assessment & Plan Note (Addendum)
 Continue Arava

## 2024-07-04 ENCOUNTER — Encounter: Payer: Self-pay | Admitting: Family Medicine

## 2024-07-04 ENCOUNTER — Observation Stay
Admit: 2024-07-04 | Discharge: 2024-07-04 | Disposition: A | Attending: Nurse Practitioner | Admitting: Nurse Practitioner

## 2024-07-04 DIAGNOSIS — Z794 Long term (current) use of insulin: Secondary | ICD-10-CM | POA: Diagnosis not present

## 2024-07-04 DIAGNOSIS — F03918 Unspecified dementia, unspecified severity, with other behavioral disturbance: Secondary | ICD-10-CM

## 2024-07-04 DIAGNOSIS — I447 Left bundle-branch block, unspecified: Secondary | ICD-10-CM

## 2024-07-04 DIAGNOSIS — A419 Sepsis, unspecified organism: Secondary | ICD-10-CM

## 2024-07-04 DIAGNOSIS — M069 Rheumatoid arthritis, unspecified: Secondary | ICD-10-CM | POA: Diagnosis present

## 2024-07-04 DIAGNOSIS — E119 Type 2 diabetes mellitus without complications: Secondary | ICD-10-CM

## 2024-07-04 DIAGNOSIS — I4891 Unspecified atrial fibrillation: Secondary | ICD-10-CM | POA: Diagnosis not present

## 2024-07-04 DIAGNOSIS — Z7982 Long term (current) use of aspirin: Secondary | ICD-10-CM | POA: Diagnosis not present

## 2024-07-04 DIAGNOSIS — Z7901 Long term (current) use of anticoagulants: Secondary | ICD-10-CM | POA: Diagnosis not present

## 2024-07-04 DIAGNOSIS — Z789 Other specified health status: Secondary | ICD-10-CM | POA: Diagnosis not present

## 2024-07-04 DIAGNOSIS — I480 Paroxysmal atrial fibrillation: Secondary | ICD-10-CM | POA: Diagnosis present

## 2024-07-04 DIAGNOSIS — E785 Hyperlipidemia, unspecified: Secondary | ICD-10-CM | POA: Diagnosis present

## 2024-07-04 DIAGNOSIS — F0390 Unspecified dementia without behavioral disturbance: Secondary | ICD-10-CM | POA: Insufficient documentation

## 2024-07-04 DIAGNOSIS — F0393 Unspecified dementia, unspecified severity, with mood disturbance: Secondary | ICD-10-CM | POA: Diagnosis present

## 2024-07-04 DIAGNOSIS — Z515 Encounter for palliative care: Secondary | ICD-10-CM | POA: Diagnosis not present

## 2024-07-04 DIAGNOSIS — Z8249 Family history of ischemic heart disease and other diseases of the circulatory system: Secondary | ICD-10-CM | POA: Diagnosis not present

## 2024-07-04 DIAGNOSIS — R531 Weakness: Secondary | ICD-10-CM | POA: Diagnosis not present

## 2024-07-04 DIAGNOSIS — U071 COVID-19: Secondary | ICD-10-CM | POA: Diagnosis present

## 2024-07-04 DIAGNOSIS — A4189 Other specified sepsis: Secondary | ICD-10-CM | POA: Diagnosis present

## 2024-07-04 DIAGNOSIS — Z7189 Other specified counseling: Secondary | ICD-10-CM | POA: Diagnosis not present

## 2024-07-04 DIAGNOSIS — I420 Dilated cardiomyopathy: Secondary | ICD-10-CM | POA: Diagnosis present

## 2024-07-04 DIAGNOSIS — Z79899 Other long term (current) drug therapy: Secondary | ICD-10-CM | POA: Diagnosis not present

## 2024-07-04 DIAGNOSIS — I4892 Unspecified atrial flutter: Secondary | ICD-10-CM | POA: Diagnosis present

## 2024-07-04 DIAGNOSIS — I5022 Chronic systolic (congestive) heart failure: Secondary | ICD-10-CM | POA: Diagnosis present

## 2024-07-04 DIAGNOSIS — E875 Hyperkalemia: Secondary | ICD-10-CM | POA: Diagnosis present

## 2024-07-04 DIAGNOSIS — J189 Pneumonia, unspecified organism: Secondary | ICD-10-CM | POA: Diagnosis present

## 2024-07-04 DIAGNOSIS — R0682 Tachypnea, not elsewhere classified: Secondary | ICD-10-CM

## 2024-07-04 DIAGNOSIS — I214 Non-ST elevation (NSTEMI) myocardial infarction: Secondary | ICD-10-CM | POA: Diagnosis present

## 2024-07-04 DIAGNOSIS — R051 Acute cough: Secondary | ICD-10-CM | POA: Diagnosis not present

## 2024-07-04 DIAGNOSIS — Z833 Family history of diabetes mellitus: Secondary | ICD-10-CM | POA: Diagnosis not present

## 2024-07-04 DIAGNOSIS — I452 Bifascicular block: Secondary | ICD-10-CM | POA: Diagnosis present

## 2024-07-04 DIAGNOSIS — F320 Major depressive disorder, single episode, mild: Secondary | ICD-10-CM | POA: Diagnosis present

## 2024-07-04 DIAGNOSIS — G9341 Metabolic encephalopathy: Secondary | ICD-10-CM | POA: Diagnosis present

## 2024-07-04 DIAGNOSIS — I350 Nonrheumatic aortic (valve) stenosis: Secondary | ICD-10-CM | POA: Diagnosis present

## 2024-07-04 LAB — TROPONIN I (HIGH SENSITIVITY)
Troponin I (High Sensitivity): 855 ng/L (ref <18)
Troponin I (High Sensitivity): 1099 ng/L (ref <18)

## 2024-07-04 LAB — CBC
HCT: 34.1 % — ABNORMAL LOW (ref 39.0–52.0)
Hemoglobin: 11.4 g/dL — ABNORMAL LOW (ref 13.0–17.0)
MCH: 33.9 pg (ref 26.0–34.0)
MCHC: 33.4 g/dL (ref 30.0–36.0)
MCV: 101.5 fL — ABNORMAL HIGH (ref 80.0–100.0)
Platelets: 110 K/uL — ABNORMAL LOW (ref 150–400)
RBC: 3.36 MIL/uL — ABNORMAL LOW (ref 4.22–5.81)
RDW: 11.9 % (ref 11.5–15.5)
WBC: 4.6 K/uL (ref 4.0–10.5)
nRBC: 0 % (ref 0.0–0.2)

## 2024-07-04 LAB — HEPARIN LEVEL (UNFRACTIONATED): Heparin Unfractionated: >1.1 [IU]/mL — ABNORMAL HIGH (ref 0.30–0.70)

## 2024-07-04 LAB — BASIC METABOLIC PANEL WITH GFR
Anion gap: 9 (ref 5–15)
BUN: 22 mg/dL (ref 8–23)
CO2: 22 mmol/L (ref 22–32)
Calcium: 7.3 mg/dL — ABNORMAL LOW (ref 8.9–10.3)
Chloride: 107 mmol/L (ref 98–111)
Creatinine, Ser: 0.87 mg/dL (ref 0.61–1.24)
GFR, Estimated: >60 mL/min (ref >60)
Glucose, Bld: 125 mg/dL — ABNORMAL HIGH (ref 70–99)
Potassium: 3.8 mmol/L (ref 3.5–5.1)
Sodium: 138 mmol/L (ref 135–145)

## 2024-07-04 LAB — ECHOCARDIOGRAM LIMITED
AR max vel: 0.55 cm2
AV Area VTI: 0.48 cm2
AV Area mean vel: 0.53 cm2
AV Mean grad: 24 mmHg
AV Peak grad: 42.8 mmHg
Ao pk vel: 3.27 m/s
Area-P 1/2: 7.9 cm2
Calc EF: 38 %
Height: 70 in
MV VTI: 1.77 cm2
S' Lateral: 3.5 cm
Single Plane A2C EF: 32.1 %
Single Plane A4C EF: 42.8 %
Weight: 2720 [oz_av]

## 2024-07-04 LAB — CBG MONITORING, ED: Glucose-Capillary: 117 mg/dL — ABNORMAL HIGH (ref 70–99)

## 2024-07-04 LAB — GLUCOSE, CAPILLARY
Glucose-Capillary: 149 mg/dL — ABNORMAL HIGH (ref 70–99)
Glucose-Capillary: 137 mg/dL — ABNORMAL HIGH (ref 70–99)
Glucose-Capillary: 139 mg/dL — ABNORMAL HIGH (ref 70–99)

## 2024-07-04 LAB — MAGNESIUM: Magnesium: 2 mg/dL (ref 1.7–2.4)

## 2024-07-04 LAB — TSH: TSH: 0.217 u[IU]/mL — ABNORMAL LOW (ref 0.350–4.500)

## 2024-07-04 LAB — APTT: aPTT: 51 s — ABNORMAL HIGH (ref 24–36)

## 2024-07-04 LAB — HEMOGLOBIN A1C
Hgb A1c MFr Bld: 5.7 % — ABNORMAL HIGH (ref 4.8–5.6)
Mean Plasma Glucose: 116.89 mg/dL

## 2024-07-04 LAB — PROTIME-INR
INR: 1.1 (ref 0.8–1.2)
Prothrombin Time: 14.9 s (ref 11.4–15.2)

## 2024-07-04 MED ORDER — SODIUM CHLORIDE 0.9 % IV SOLN: INTRAVENOUS | Status: DC

## 2024-07-04 MED ORDER — METOPROLOL TARTRATE 25 MG PO TABS
25.0000 mg | ORAL_TABLET | Freq: Four times a day (QID) | ORAL | Status: DC
  Administered 2024-07-04 (×2): 25 mg via ORAL
  Filled 2024-07-04 (×2): qty 1

## 2024-07-04 MED ORDER — METHOCARBAMOL 500 MG PO TABS: 500.0000 mg | ORAL_TABLET | Freq: Four times a day (QID) | ORAL | Status: DC | PRN

## 2024-07-04 MED ORDER — PERFLUTREN LIPID MICROSPHERE
1.0000 mL | INTRAVENOUS | Status: AC | PRN
  Administered 2024-07-04: 2 mL via INTRAVENOUS

## 2024-07-04 MED ORDER — GUAIFENESIN-DM 100-10 MG/5ML PO SYRP
10.0000 mL | ORAL_SOLUTION | ORAL | Status: DC | PRN
  Administered 2024-07-04 – 2024-07-13 (×4): 10 mL via ORAL
  Filled 2024-07-04 (×4): qty 10

## 2024-07-04 MED ORDER — LEFLUNOMIDE 10 MG PO TABS
10.0000 mg | ORAL_TABLET | Freq: Every day | ORAL | Status: DC
  Administered 2024-07-05: 10 mg via ORAL
  Filled 2024-07-04: qty 1

## 2024-07-04 MED ORDER — METOPROLOL TARTRATE 5 MG/5ML IV SOLN
5.0000 mg | INTRAVENOUS | Status: DC | PRN
  Administered 2024-07-04 (×2): 5 mg via INTRAVENOUS
  Filled 2024-07-04 (×2): qty 5

## 2024-07-04 MED ORDER — ASPIRIN 81 MG PO TBEC
81.0000 mg | DELAYED_RELEASE_TABLET | Freq: Every day | ORAL | Status: DC
  Administered 2024-07-04: 81 mg via ORAL
  Filled 2024-07-04: qty 1

## 2024-07-04 MED ORDER — METOPROLOL TARTRATE 25 MG PO TABS
25.0000 mg | ORAL_TABLET | Freq: Two times a day (BID) | ORAL | Status: DC
  Administered 2024-07-04: 25 mg via ORAL
  Filled 2024-07-04: qty 1

## 2024-07-04 MED ORDER — METOPROLOL TARTRATE 25 MG PO TABS
25.0000 mg | ORAL_TABLET | Freq: Once | ORAL | Status: AC
Start: 2024-07-04 — End: 2024-07-04
  Administered 2024-07-04: 25 mg via ORAL
  Filled 2024-07-04: qty 1

## 2024-07-04 MED ORDER — APIXABAN 5 MG PO TABS
5.0000 mg | ORAL_TABLET | Freq: Two times a day (BID) | ORAL | Status: DC
  Administered 2024-07-04: 5 mg via ORAL
  Filled 2024-07-04: qty 1

## 2024-07-04 MED ORDER — HEPARIN (PORCINE) 25000 UT/250ML-% IV SOLN
1000.0000 [IU]/h | INTRAVENOUS | Status: DC
Start: 2024-07-04 — End: 2024-07-05
  Administered 2024-07-04: 1000 [IU]/h via INTRAVENOUS
  Filled 2024-07-04: qty 250

## 2024-07-04 NOTE — Consult Note (Signed)
 Pharmacy Consult Note - Anticoagulation  Pharmacy Consult for heparin Indication: chest pain/ACS and atrial fibrillation  PATIENT MEASUREMENTS: Height: 5' 10 (177.8 cm) Weight: 77.1 kg (170 lb) IBW/kg (Calculated) : 73 HEPARIN DW (KG): 77.1  VITAL SIGNS: Temp: 98.3 F (36.8 C) (10/25 1115) Temp Source: Oral (10/25 0535) BP: 146/88 (10/25 1115) Pulse Rate: 106 (10/25 1115)  Recent Labs    07/03/24 1951 07/04/24 0330 07/04/24 1227 07/04/24 1516  HGB  --  11.4*  --   --   HCT  --  34.1*  --   --   PLT  --  110*  --   --   LABPROT 14.2  --   --   --   INR 1.0  --   --   --   CREATININE  --  0.87  --   --   TROPONINIHS  --   --    < > 1,099*   < > = values in this interval not displayed.    Estimated Creatinine Clearance: 74.6 mL/min (by C-G formula based on SCr of 0.87 mg/dL).  PAST MEDICAL HISTORY: Past Medical History:  Diagnosis Date   Bleeding hemorrhoids    a. 2015 - FOB+ stool, colonscopy - thrombosed int hemorrhoids.   Carpal tunnel syndrome    Diabetes mellitus without complication (HCC)    Hyperlipidemia    Nephrolithiasis    H/O stones   Rheumatoid arthritis (HCC)    Severe aortic stenosis    a. 05/2024 Echo: EF 50-55% without regional wall motion abnormalities, mild LVH, grade 1 diastolic dysfunction, normal RV function, mild MR, mild AI, and severe aortic stenosis with a mean aortic valve gradient of 40 mmHg, peak gradient of 62, and aortic valve area of 0.6 cm.    ASSESSMENT: 78 y.o. male with PMH including severe aortic stenosis is presenting with NSTEMI and Afib. Patient had been started on Eliquis for Afib (CHA2DS2VASc 3, most recent dose this morning) and new LBBB. Pharmacy has been consulted to initiate and manage heparin intravenous infusion.  Pertinent medications: Eliquis 5mg , most recent dose 10/25 morning time  Goal(s) of therapy: Heparin level 0.3 - 0.7 units/mL Monitor platelets by anticoagulation protocol: Yes   Baseline  anticoagulation labs: Recent Labs    07/03/24 1629 07/03/24 1951 07/04/24 0330  INR  --  1.0  --   HGB 12.5*  --  11.4*  PLT 127*  --  110*    Date Time aPTT/HL Rate/Comment    PLAN: Discontinue apixaban Start heparin infusion at 1000 units/hour ~twelve hrs from most recent apixaban dose Check aPTT eight hours from start of infusion Monitor CBC daily while on heparin infusion.  Will M. Lenon, PharmD, BCPS Clinical Pharmacist 07/04/2024 4:54 PM

## 2024-07-04 NOTE — Consult Note (Addendum)
 Cardiology Consult    Patient ID: OLE LAFON MRN: [id number], DOB/AGE: 1946-04-27   Admit date: 07/03/2024 Date of Consult: 07/04/2024  Primary Physician: Glendia Shad, MD Primary Cardiologist: Redell Cave, MD  Cardiology APP:  [place] Mikey DEL, PA-C  Structural Heart:  Lonni Cash, MD Requesting Provider: R. Wieting, MD  Patient Profile    Donald [name] is a 78 y.o. male with a history of severe aortic stenosis, diabetes, hyperlipidemia, bleeding hemorrhoids (2015), dementia, and rheumatoid arthritis, who is being seen today for the evaluation of A-fib with RVR in the setting of COVID-19 infection at the request of Dr. Josette.  Past Medical History   Subjective  Past Medical History:  Diagnosis Date   Bleeding hemorrhoids    a. 2015 - FOB+ stool, colonscopy - thrombosed int hemorrhoids.   Carpal tunnel syndrome    Diabetes mellitus without complication (HCC)    Hyperlipidemia    Nephrolithiasis    H/O stones   Rheumatoid arthritis (HCC)    Severe aortic stenosis    a. 05/2024 Echo: EF 50-55% without regional wall motion abnormalities, mild LVH, grade 1 diastolic dysfunction, normal RV function, mild MR, mild AI, and severe aortic stenosis with a mean aortic valve gradient of 40 mmHg, peak gradient of 62, and aortic valve area of 0.6 cm.    Past Surgical History:  Procedure Laterality Date   APPENDECTOMY  1962   CARPAL TUNNEL RELEASE Bilateral    hands     Allergies  No Known Allergies     History of Present Illness   78 y.o. male with a history of severe aortic stenosis, diabetes, hyperlipidemia, bleeding hemorrhoids (2015), dementia, and rheumatoid arthritis.  Patient has a history of aortic stenosis dating back to 2018 when mild aortic stenosis was discovered on echocardiogram in the setting of a cardiac murmur.  He established care with Dr. Cave in June 2023 and has since had serial echocardiograms, the most recent of which performed  in September 2025 showing an EF of 50-55% without regional wall motion abnormalities, mild LVH, grade 1 diastolic dysfunction, normal RV function, mild MR, mild AI, and severe aortic stenosis with a mean aortic valve gradient of 40 mmHg, peak gradient of 62, and aortic valve area of 0.6 cm.    In the setting of severe aortic stenosis, Donald [name] has been seen in structural heart clinic in [place], most recently on June 08, 2024, with plan for follow-up echo in 6 months and likely further TAVR workup over the next 6 to 12 months.  Patient presented to the emergency department on October 24 with a several day history of progressive weakness, anorexia, and cough.  In the ED, he has a low grade fever of 99.1 - 100.7.  ECG showed Sinus tachycardia, 106, new LBBB.  He was hemodynamically stable and saturating @ 99% on RA.  Labs notable for lactate of 2.3.  H/H sl lower than previous @ 12.5/37.8.  WBC nl @ 6.1. Resp panel + for COVID19.  CXR w/o acute cardiopulmonary process.  He was given a dose of ceftriaxone and 1L of lactated ringers.  He was admitted for further mgmt. This AM, just after 9:30, he became tachycardic w/ tele showing Afib w/ RVR to the 150's.  He was given 5 mg of IV metoprolol followed by metoprolol tartrate 25mg  PO.  He has since converted to sinus rhythm w/ recurrent paroxysms of afib/flutter.  Pt cont to feel weak but denies c/p, dyspnea, or palpitations.  He was not aware that he was in afib earlier and is not aware of any prior h/o afib/palpitations. Inpatient Medications   Subjective    aspirin EC  81 mg Oral Daily   cyanocobalamin   1,000 mcg Oral Daily   enoxaparin (LOVENOX) injection  40 mg Subcutaneous Q24H   insulin aspart  0-15 Units Subcutaneous TID WC   insulin aspart  0-5 Units Subcutaneous QHS   [START ON 07/05/2024] leflunomide  10 mg Oral Daily   loratadine  10 mg Oral Daily   magnesium oxide  400 mg Oral Daily   metoprolol tartrate  25 mg Oral BID   multivitamin  with minerals  1 tablet Oral Daily   rivastigmine  1.5 mg Oral BID   rosuvastatin   10 mg Oral Daily    Family History    Family History  Problem Relation Age of Onset   Heart disease Father        Heart attack-54   Hypertension Father    Diabetes Maternal Grandmother    He indicated that his mother is deceased. He indicated that his father is deceased. He indicated that the status of his maternal grandmother is unknown. He indicated that his daughter is deceased.   Social History    Social History   Socioeconomic History   Marital status: Married    Spouse name: Not on file   Number of children: 3   Years of education: Not on file   Highest education level: Not on file  Occupational History   Occupation: Retired-Mechanic  Tobacco Use   Smoking status: Former    Types: Cigarettes   Smokeless tobacco: Never  Vaping Use   Vaping status: Never Used  Substance and Sexual Activity   Alcohol use: No    Alcohol/week: 0.0 standard drinks of alcohol   Drug use: No   Sexual activity: Not Currently  Other Topics Concern   Not on file  Social History Narrative   Married   Social Drivers of Health   Financial Resource Strain: Low Risk  (12/19/2023)   Received from [facility]   Overall Financial Resource Strain (CARDIA)    Difficulty of Paying Living Expenses: Not hard at all  Food Insecurity: No Food Insecurity (07/04/2024)   Hunger Vital Sign    Worried About Running Out of Food in the Last Year: Never true    Ran Out of Food in the Last Year: Never true  Transportation Needs: No Transportation Needs (07/04/2024)   PRAPARE - Administrator, Civil Service (Medical): No    Lack of Transportation (Non-Medical): No  Physical Activity: Inactive (08/20/2023)   Exercise Vital Sign    Days of Exercise per Week: 0 days    Minutes of Exercise per Session: 0 min  Stress: No Stress Concern Present (08/20/2023)   [employer] of Occupational  Health - Occupational Stress Questionnaire    Feeling of Stress : Not at all  Social Connections: Moderately Integrated (07/04/2024)   Social Connection and Isolation Panel    Frequency of Communication with Friends and Family: More than three times a week    Frequency of Social Gatherings with Friends and Family: Three times a week    Attends Religious Services: More than 4 times per year    Active Member of Clubs or Organizations: No    Attends Banker Meetings: Never    Marital Status: Married  Catering Manager Violence: Not At Risk (07/04/2024)   Humiliation, Afraid,  Rape, and Kick questionnaire    Fear of Current or Ex-Partner: No    Emotionally Abused: No    Physically Abused: No    Sexually Abused: No     Review of Systems    General:  +++ generalized malaise.  No chills, fever, night sweats or weight changes.  Cardiovascular:  No chest pain, dyspnea on exertion, edema, orthopnea, palpitations, paroxysmal nocturnal dyspnea. Dermatological: No rash, lesions/masses Respiratory: +++ cough, +++ dyspnea Urologic: No hematuria, dysuria Abdominal:   +++ anorexia.  No nausea, vomiting, diarrhea, bright red blood per rectum, melena, or hematemesis Neurologic:  No visual changes, wkns, changes in mental status. All other systems reviewed and are otherwise negative except as noted above.     Objective   Physical Exam    Blood pressure 138/71, pulse (!) 155, temperature 99.1 F (37.3 C), resp. rate 16, height 5' 10 (1.778 m), weight 77.1 kg, SpO2 92%.  General: Pleasant, NAD Psych: Normal affect. Neuro: Alert and oriented X 3. Moves all extremities spontaneously. HEENT: Normal  Neck: Supple without bruits or JVD. Lungs:  Resp regular and unlabored, CTA. Heart: RRR 3/6 SEM, no s3, s4, or murmurs. Abdomen: Soft, non-tender, non-distended, BS + x 4.  Extremities: No clubbing, cyanosis or edema. DP/PT2+, Radials 2+ and equal bilaterally.  Labs     Lab Results   Component Value Date   WBC 4.6 07/04/2024   HGB 11.4 (L) 07/04/2024   HCT 34.1 (L) 07/04/2024   MCV 101.5 (H) 07/04/2024   PLT 110 (L) 07/04/2024    Recent Labs  Lab 07/04/24 0330  NA 138  K 3.8  CL 107  CO2 22  BUN 22  CREATININE 0.87  CALCIUM  7.3*  GLUCOSE 125*   Lab Results  Component Value Date   CHOL 191 03/12/2024   HDL 42 03/12/2024   LDLCALC 116 (H) 03/12/2024   TRIG 189 (H) 03/12/2024   Lab Results  Component Value Date   HGBA1C 5.7 (H) 07/03/2024      Radiology Studies    DG Chest Port 1 View Result Date: 07/03/2024 EXAM: 1 VIEW(S) XRAY OF THE CHEST 07/03/2024 08:11:00 PM COMPARISON: 12/04/2016 CLINICAL HISTORY: Questionable sepsis - evaluate for abnormality. FINDINGS: LUNGS AND PLEURA: No focal pulmonary opacity. No pulmonary edema. No pleural effusion. No pneumothorax. HEART AND MEDIASTINUM: Aortic atherosclerosis. No acute abnormality of the cardiac and mediastinal silhouettes. BONES AND SOFT TISSUES: No acute osseous abnormality. IMPRESSION: 1. No acute cardiopulmonary process. Electronically signed by: Pinkie Pebbles MD 07/03/2024 08:17 PM EDT RP Workstation: [id number]      ECG & Cardiac Imaging    Sinus tachycardia, 106, LBBB (new) - personally reviewed.  Assessment & Plan    1.  Afib w/ RVR: Patient with prior history of severe aortic stenosis but no prior history of arrhythmias, was admitted with COVID-19 and this morning developed paroxysmal A-fib/flutter with RVR.  He is currently in sinus rhythm following beta-blocker dosing.  CHA2DS2-VASc equals 3.  I will change metoprolol to 25 mg every 6 hours, DC Lovenox and aspirin, and add Eliquis 5 mg twice daily.  Electrolytes are normal.  Follow-up TSH.  2.  COVID19 infection: Management per primary team.  3.  Severe Aortic stenosis: Historically asymptomatic.  Seen by structural heart with plan for follow-up echo in March 2026.  4.  LBBB:  New this admission.  No history of chest pain or dyspnea.   Will obtain troponins and limited echo to reevaluate LV function.  5.  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  Sliding scale insulin per medicine team.  6.  HL: LDL was 116 in July.  He is on rosuvastatin .   Risk Assessment/Risk Scores:          CHA2DS2-VASc Score = 3   This indicates a 3.2% annual risk of stroke. The patient's score is based upon: CHF History: 0 HTN History: 0 Diabetes History: 1 Stroke History: 0 Vascular Disease History: 0 Age Score: 2 Gender Score: 0     Signed, Lonni Meager, NP 07/04/2024, 11:12 AM  For questions or updates, please contact   Please consult [email or web address] for contact info under Cardiology/STEMI.

## 2024-07-04 NOTE — Assessment & Plan Note (Signed)
 Suspect paroxysmal in nature with COVID infection.  Patient asymptomatic with respect to fast heart beat.  Will transfer to progressive care unit.  As needed IV metoprolol every hour for heart rate greater than 135.  Start oral metoprolol.  Cardiology consultation.  Cardiology started on Eliquis.

## 2024-07-04 NOTE — Progress Notes (Signed)
 Progress Note   Patient: Donald [name] FMW:[id number] DOB: 1946-07-06 DOA: 07/03/2024     0 DOS: the patient was seen and examined on 07/04/2024   Brief hospital course: 78 y.o. Caucasian male with medical history significant for type 2 diabetes mellitus, dyslipidemia, rheumatoid arthritis and aortic stenosis, who presented to the emergency room with acute onset of generalized weakness.  He has been having cough today with no dyspnea or wheezing.  He denied any chest pain or palpitations.  No fever or chills.  No nausea or vomiting or diarrhea or abdominal pain.  No dysuria,, urinary frequency or urgency, hematuria or flank pain.  He denied any headache or dizziness or blurred vision.  No paresthesias or focal muscle weakness.  He was able to mow his lawn yesterday however today he felt so weak he had to be assisted to standing.  No recent sick exposure.  He has been having significant diminished p.o. intake.  The patient denied any loss of taste or smell   ED Course: When he came to the ER, temperature was 99.1 and later 100.7, heart rate was 103 with otherwise normal vital signs.  Respiratory rate was normal and later 29.  BMP showed CO2 of 21 and blood glucose of 150 with calcium  of 8.7 and.  Lactic acid was 2.3 and later 1.4 and CBC showed hemoglobin 12.5 and hematocrit 37.8 with microcytosis.  PT and INR were normal.  Urinalysis showed 30 protein and 50 glucose and was otherwise unremarkable.  Respiratory panel came back positive for COVID-19.  Blood cultures were sent. EKG as reviewed by me :  EKG showed sinus tachycardia with a rate of 106 with nonspecific intraventricular conduction delay and Q waves anteroseptally with T wave inversion inferiorly.  Previous EKG on 11/15/2023 was normal Imaging: Portable chest x-ray showed no acute cardiopulmonary disease.   The patient was given 1 g p.o. Tylenol  and 1 g of IV Rocephin and 1 L bolus of IV lactated Ringer.  He will be admitted to a medical telemetry  observation bed for further evaluation and management.  10/25.  Called with heart rate in the 150s.  EKG showing atrial fibrillation with rapid ventricular response 158 bpm.  Will transfer to the progressive care unit.  Metoprolol 5 mg IV every hour as needed for heart rate greater than 135 and start oral metoprolol.  Cardiology consultation.  Patient started on Eliquis.  Assessment and Plan: * Atrial fibrillation with rapid ventricular response (HCC) Suspect paroxysmal in nature with COVID infection.  Patient asymptomatic with respect to fast heart beat.  Will transfer to progressive care unit.  As needed IV metoprolol every hour for heart rate greater than 135.  Start oral metoprolol.  Cardiology consultation.  Cardiology started on Eliquis.  COVID Supportive care.  Gentle fluids.  Not sure if I would call the sepsis.  Dyslipidemia On Crestor   Rheumatoid arthritis (HCC) Continue Arava.  Controlled type 2 diabetes mellitus without complication, without long-term current use of insulin (HCC) Hemoglobin A1c low at 5.7.  Patient on sliding scale insulin  Dementia without behavioral disturbance (HCC) On Exelon  Vitamin B12 deficiency Continue vitamin B12        Subjective: Called to see patient for rapid heart rate in the 150s.  EKG showing atrial fibrillation in the 150s.  History of aortic stenosis.  Will transfer to progressive care unit.  Patient without chest pain or palpitations.  Some shortness of breath and cough.  Main symptom is weakness.  Physical  Exam: Vitals:   07/04/24 0757 07/04/24 0955 07/04/24 1022 07/04/24 1115  BP: 139/82 138/71  (!) 146/88  Pulse: (!) 112 (!) 58 (!) 155 (!) 106  Resp: 16 16    Temp: 99.1 F (37.3 C)   98.3 F (36.8 C)  TempSrc:      SpO2: 93% 92%  95%  Weight:      Height:       Physical Exam HENT:     Head: Normocephalic.     Mouth/Throat:     Pharynx: No oropharyngeal exudate.  Eyes:     General: Lids are normal.      Conjunctiva/sclera: Conjunctivae normal.  Cardiovascular:     Rate and Rhythm: Tachycardia present. Rhythm irregularly irregular.     Heart sounds: Normal heart sounds, S1 normal and S2 normal.  Pulmonary:     Breath sounds: No decreased breath sounds, wheezing, rhonchi or rales.  Abdominal:     Palpations: Abdomen is soft.     Tenderness: There is no abdominal tenderness.  Musculoskeletal:     Right lower leg: No swelling.     Left lower leg: No swelling.  Skin:    General: Skin is warm.     Findings: No rash.  Neurological:     Mental Status: He is alert.     Data Reviewed: EKG rapid atrial fibrillation 158 beats per minute, non specific st-t wave changes Family Communication: Wife at bedside  Disposition: Status is: Observation With rapid atrial fibrillation with heart rate in the 150s will transfer to progressive care unit to watch more closely.  As needed IV metoprolol oral metoprolol started.  Eliquis started.  Cardiology consultation.  Planned Discharge Destination: Home with Home Health    Time spent: 28 minutes  Author: Charlie Patterson, MD 07/04/2024 1:02 PM  For on call review [email or web address].

## 2024-07-04 NOTE — Progress Notes (Signed)
*  PRELIMINARY RESULTS* Echocardiogram 2D Echocardiogram has been performed.  Donald [name] Donald [name] 07/04/2024, 3:21 PM

## 2024-07-04 NOTE — Hospital Course (Signed)
 78 y.o. Caucasian male with medical history significant for type 2 diabetes mellitus, dyslipidemia, rheumatoid arthritis and aortic stenosis, who presented to the emergency room with acute onset of generalized weakness.  He has been having cough today with no dyspnea or wheezing.  He denied any chest pain or palpitations.  No fever or chills.  No nausea or vomiting or diarrhea or abdominal pain.  No dysuria,, urinary frequency or urgency, hematuria or flank pain.  He denied any headache or dizziness or blurred vision.  No paresthesias or focal muscle weakness.  He was able to mow his lawn yesterday however today he felt so weak he had to be assisted to standing.  No recent sick exposure.  He has been having significant diminished p.o. intake.  The patient denied any loss of taste or smell   ED Course: When he came to the ER, temperature was 99.1 and later 100.7, heart rate was 103 with otherwise normal vital signs.  Respiratory rate was normal and later 29.  BMP showed CO2 of 21 and blood glucose of 150 with calcium  of 8.7 and.  Lactic acid was 2.3 and later 1.4 and CBC showed hemoglobin 12.5 and hematocrit 37.8 with microcytosis.  PT and INR were normal.  Urinalysis showed 30 protein and 50 glucose and was otherwise unremarkable.  Respiratory panel came back positive for COVID-19.  Blood cultures were sent. EKG as reviewed by me :  EKG showed sinus tachycardia with a rate of 106 with nonspecific intraventricular conduction delay and Q waves anteroseptally with T wave inversion inferiorly.  Previous EKG on 11/15/2023 was normal Imaging: Portable chest x-ray showed no acute cardiopulmonary disease.   The patient was given 1 g p.o. Tylenol  and 1 g of IV Rocephin and 1 L bolus of IV lactated Ringer.  He will be admitted to a medical telemetry observation bed for further evaluation and management.  10/25.  Called with heart rate in the 150s.  EKG showing atrial fibrillation with rapid ventricular response 158 bpm.   Will transfer to the progressive care unit.  Metoprolol 5 mg IV every hour as needed for heart rate greater than 135 and start oral metoprolol.  Cardiology consultation.  Patient started on Eliquis.

## 2024-07-04 NOTE — Assessment & Plan Note (Signed)
 On Exelon

## 2024-07-04 NOTE — Progress Notes (Signed)
 PT Cancellation Note  Patient Details Name: Donald [name] MRN: [id number] DOB: 12/22/1945   Cancelled Treatment:    Reason Eval/Treat Not Completed: Medical issues which prohibited therapy Orders received, chart reviewed. Patient transferred to cardiac unit due to tachycardia. HR in 140s-150s at rest. Will follow up as medically appropriate.   Maryanne Finder, PT, DPT Physical Therapist - [place]  [facility]   Donald [name] A Donald [name] 07/04/2024, 11:16 AM

## 2024-07-04 NOTE — Progress Notes (Signed)
 Monitor room notified nurse that pt was tachycardic.  Pt laying in bed doing no activity upon assessment.  Dr. Josette at nurses station and notified in person that pt HR was ruinning 140s-150s.  MD in room with patient.  EKG was ordered, EKG taken and BP taken as well.  Orders from Dr. Josette for metoprolol PO and IV to be given and for pt to transfer to cardiac unit.  Report to be called to floor.

## 2024-07-05 ENCOUNTER — Inpatient Hospital Stay

## 2024-07-05 DIAGNOSIS — I420 Dilated cardiomyopathy: Secondary | ICD-10-CM

## 2024-07-05 DIAGNOSIS — E785 Hyperlipidemia, unspecified: Secondary | ICD-10-CM | POA: Diagnosis not present

## 2024-07-05 DIAGNOSIS — Z515 Encounter for palliative care: Secondary | ICD-10-CM | POA: Diagnosis not present

## 2024-07-05 DIAGNOSIS — R0682 Tachypnea, not elsewhere classified: Secondary | ICD-10-CM | POA: Diagnosis not present

## 2024-07-05 DIAGNOSIS — U071 COVID-19: Secondary | ICD-10-CM | POA: Diagnosis not present

## 2024-07-05 DIAGNOSIS — A419 Sepsis, unspecified organism: Secondary | ICD-10-CM

## 2024-07-05 DIAGNOSIS — R918 Other nonspecific abnormal finding of lung field: Secondary | ICD-10-CM | POA: Diagnosis not present

## 2024-07-05 DIAGNOSIS — J811 Chronic pulmonary edema: Secondary | ICD-10-CM | POA: Diagnosis not present

## 2024-07-05 DIAGNOSIS — I480 Paroxysmal atrial fibrillation: Secondary | ICD-10-CM | POA: Diagnosis not present

## 2024-07-05 DIAGNOSIS — I4891 Unspecified atrial fibrillation: Secondary | ICD-10-CM | POA: Diagnosis not present

## 2024-07-05 DIAGNOSIS — R531 Weakness: Secondary | ICD-10-CM

## 2024-07-05 DIAGNOSIS — M069 Rheumatoid arthritis, unspecified: Secondary | ICD-10-CM | POA: Diagnosis not present

## 2024-07-05 DIAGNOSIS — J90 Pleural effusion, not elsewhere classified: Secondary | ICD-10-CM | POA: Diagnosis not present

## 2024-07-05 DIAGNOSIS — J189 Pneumonia, unspecified organism: Secondary | ICD-10-CM | POA: Diagnosis not present

## 2024-07-05 LAB — CBC
HCT: 38.8 % — ABNORMAL LOW (ref 39.0–52.0)
Hemoglobin: 13.1 g/dL (ref 13.0–17.0)
MCH: 33.9 pg (ref 26.0–34.0)
MCHC: 33.8 g/dL (ref 30.0–36.0)
MCV: 100.5 fL — ABNORMAL HIGH (ref 80.0–100.0)
Platelets: 109 K/uL — ABNORMAL LOW (ref 150–400)
RBC: 3.86 MIL/uL — ABNORMAL LOW (ref 4.22–5.81)
RDW: 11.8 % (ref 11.5–15.5)
WBC: 4.4 K/uL (ref 4.0–10.5)
nRBC: 0 % (ref 0.0–0.2)

## 2024-07-05 LAB — APTT
aPTT: >160 s — ABNORMAL HIGH (ref 24–36)
aPTT: 109 s — ABNORMAL HIGH (ref 24–36)

## 2024-07-05 LAB — PROCALCITONIN: Procalcitonin: 5.14 ng/mL

## 2024-07-05 LAB — GLUCOSE, CAPILLARY
Glucose-Capillary: 132 mg/dL — ABNORMAL HIGH (ref 70–99)
Glucose-Capillary: 232 mg/dL — ABNORMAL HIGH (ref 70–99)
Glucose-Capillary: 162 mg/dL — ABNORMAL HIGH (ref 70–99)
Glucose-Capillary: 207 mg/dL — ABNORMAL HIGH (ref 70–99)

## 2024-07-05 LAB — HEPARIN LEVEL (UNFRACTIONATED): Heparin Unfractionated: 1.1 [IU]/mL — ABNORMAL HIGH (ref 0.30–0.70)

## 2024-07-05 LAB — C-REACTIVE PROTEIN: CRP: 21.4 mg/dL — ABNORMAL HIGH (ref <1.0)

## 2024-07-05 MED ORDER — SODIUM CHLORIDE 0.9 % IV SOLN
100.0000 mg | Freq: Every day | INTRAVENOUS | Status: AC
  Administered 2024-07-06 – 2024-07-07 (×2): 100 mg via INTRAVENOUS
  Filled 2024-07-05 (×2): qty 20

## 2024-07-05 MED ORDER — AMIODARONE HCL IN DEXTROSE 360-4.14 MG/200ML-% IV SOLN
60.0000 mg/h | INTRAVENOUS | Status: DC
  Administered 2024-07-05 (×2): 60 mg/h via INTRAVENOUS
  Filled 2024-07-05 (×3): qty 200

## 2024-07-05 MED ORDER — METOPROLOL SUCCINATE ER 25 MG PO TB24
25.0000 mg | ORAL_TABLET | Freq: Two times a day (BID) | ORAL | Status: DC
  Administered 2024-07-06 – 2024-07-08 (×5): 25 mg via ORAL
  Filled 2024-07-05 (×5): qty 1

## 2024-07-05 MED ORDER — METOPROLOL SUCCINATE ER 50 MG PO TB24
50.0000 mg | ORAL_TABLET | Freq: Two times a day (BID) | ORAL | Status: DC
  Administered 2024-07-05: 50 mg via ORAL
  Filled 2024-07-05: qty 1

## 2024-07-05 MED ORDER — ALBUTEROL SULFATE (2.5 MG/3ML) 0.083% IN NEBU
3.0000 mL | INHALATION_SOLUTION | Freq: Once | RESPIRATORY_TRACT | Status: AC
  Administered 2024-07-05: 3 mL via RESPIRATORY_TRACT
  Filled 2024-07-05: qty 3

## 2024-07-05 MED ORDER — HEPARIN (PORCINE) 25000 UT/250ML-% IV SOLN
1000.0000 [IU]/h | INTRAVENOUS | Status: DC
  Administered 2024-07-05: 800 [IU]/h via INTRAVENOUS
  Administered 2024-07-06: 850 [IU]/h via INTRAVENOUS
  Filled 2024-07-05 (×2): qty 250

## 2024-07-05 MED ORDER — SODIUM CHLORIDE 0.9 % IV SOLN
1.0000 g | INTRAVENOUS | Status: DC
  Filled 2024-07-05: qty 10

## 2024-07-05 MED ORDER — SODIUM CHLORIDE 0.9 % IV SOLN
200.0000 mg | Freq: Once | INTRAVENOUS | Status: AC
  Administered 2024-07-05: 200 mg via INTRAVENOUS
  Filled 2024-07-05: qty 40

## 2024-07-05 MED ORDER — SODIUM CHLORIDE 0.9 % IV SOLN
2.0000 g | INTRAVENOUS | Status: DC
  Administered 2024-07-05: 2 g via INTRAVENOUS
  Filled 2024-07-05 (×2): qty 20

## 2024-07-05 MED ORDER — AMIODARONE LOAD VIA INFUSION
150.0000 mg | Freq: Once | INTRAVENOUS | Status: AC
  Administered 2024-07-05: 150 mg via INTRAVENOUS
  Filled 2024-07-05: qty 83.34

## 2024-07-05 MED ORDER — AMIODARONE HCL IN DEXTROSE 360-4.14 MG/200ML-% IV SOLN
30.0000 mg/h | INTRAVENOUS | Status: DC
  Administered 2024-07-05 – 2024-07-06 (×3): 30 mg/h via INTRAVENOUS
  Filled 2024-07-05 (×2): qty 200

## 2024-07-05 NOTE — Progress Notes (Signed)
 Progress Note   Patient: Donald [name] FMW:[id number] DOB: Nov 12, 1945 DOA: 07/03/2024     1 DOS: the patient was seen and examined on 07/05/2024   Brief hospital course: 78 y.o. Caucasian male with medical history significant for type 2 diabetes mellitus, dyslipidemia, rheumatoid arthritis and aortic stenosis, who presented to the emergency room with acute onset of generalized weakness.  He has been having cough today with no dyspnea or wheezing.  He denied any chest pain or palpitations.  No fever or chills.  No nausea or vomiting or diarrhea or abdominal pain.  No dysuria,, urinary frequency or urgency, hematuria or flank pain.  He denied any headache or dizziness or blurred vision.  No paresthesias or focal muscle weakness.  He was able to mow his lawn yesterday however today he felt so weak he had to be assisted to standing.  No recent sick exposure.  He has been having significant diminished p.o. intake.  The patient denied any loss of taste or smell   ED Course: When he came to the ER, temperature was 99.1 and later 100.7, heart rate was 103 with otherwise normal vital signs.  Respiratory rate was normal and later 29.  BMP showed CO2 of 21 and blood glucose of 150 with calcium  of 8.7 and.  Lactic acid was 2.3 and later 1.4 and CBC showed hemoglobin 12.5 and hematocrit 37.8 with microcytosis.  PT and INR were normal.  Urinalysis showed 30 protein and 50 glucose and was otherwise unremarkable.  Respiratory panel came back positive for COVID-19.  Blood cultures were sent. EKG as reviewed by me :  EKG showed sinus tachycardia with a rate of 106 with nonspecific intraventricular conduction delay and Q waves anteroseptally with T wave inversion inferiorly.  Previous EKG on 11/15/2023 was normal Imaging: Portable chest x-ray showed no acute cardiopulmonary disease.   The patient was given 1 g p.o. Tylenol  and 1 g of IV Rocephin and 1 L bolus of IV lactated Ringer.  He will be admitted to a medical telemetry  observation bed for further evaluation and management.  10/25.  Called with heart rate in the 150s.  EKG showing atrial fibrillation with rapid ventricular response 158 bpm.  Will transfer to the progressive care unit.  Metoprolol 5 mg IV every hour as needed for heart rate greater than 135 and start oral metoprolol.  Cardiology consultation.    10/26: Started Remdesevir & Rocephin (due to fever 102.4 and hypoxia), getting blood c/s, CXR, Amiodarone (due to A.fib with RVR), metoprolol  Assessment and Plan: * Atrial fibrillation with rapid ventricular response (HCC) Suspect paroxysmal in nature with COVID infection.  Patient asymptomatic with respect to fast heart beat.   Monitor on progressive care unit.   - cardio started metoprolol succinate 50 twice daily -Also amiodarone bolus with infusion for better rate control -Heparin infusion for now. Eliquis on hold given low ejection fraction   Non-STEMI Troponin 1099 EKG with new left bundle branch block, now with depressed ejection fraction 35% Could be viral myocarditis from covid -Continue heparin infusion for now - cardio will decide need for ischemic eval  Symptomatic COVID infection with Hypoxia Fever 102.4 and hypoxia (needing 4 liter O2 via n.c.) - will repeat CXR and start empiric rocephin after 2 sets of blood c/s He's not vaccinated for covid - confirmed by family per nursing Now family also has covid + Checking CRP and procalcitonin  Dyslipidemia On Crestor   Rheumatoid arthritis (HCC) Hold Arava due to ongoing infection  concerns  Controlled type 2 diabetes mellitus without complication, without long-term current use of insulin (HCC) Hemoglobin A1c low at 5.7.   continue sliding scale insulin  Dementia without behavioral disturbance (HCC) On Exelon  Vitamin B12 deficiency Continue vitamin B12  GOC Palliative care c/s - poor prognosis        Subjective: feeling weak and tired, wife at bedside  Physical  Exam: Vitals:   07/05/24 1200 07/05/24 1358 07/05/24 1558 07/05/24 1800  BP:  138/78 124/73   Pulse:  99 92   Resp: (!) 30 (!) 32 (!) 22   Temp:   100.2 F (37.9 C) (!) 102.4 F (39.1 C)  TempSrc:   Axillary Axillary  SpO2:  94% 95%   Weight:      Height:       Physical Exam HENT:     Head: Normocephalic.     Mouth/Throat:     Pharynx: No oropharyngeal exudate.  Eyes:     General: Lids are normal.     Conjunctiva/sclera: Conjunctivae normal.  Cardiovascular:     Rate and Rhythm: Tachycardia present. Rhythm irregularly irregular.     Heart sounds: Normal heart sounds, S1 normal and S2 normal.  Pulmonary:     Breath sounds: Rhonchi present. No decreased breath sounds, wheezing or rales.  Abdominal:     Palpations: Abdomen is soft.     Tenderness: There is no abdominal tenderness.  Musculoskeletal:     Right lower leg: No swelling.     Left lower leg: No swelling.  Skin:    General: Skin is warm.     Findings: No rash.  Neurological:     Mental Status: He is alert.     Data Reviewed: EKG rapid atrial fibrillation 158 beats per minute, non specific st-t wave changes Family Communication: Wife updated at bedside  Disposition: Status is: Inpatient With rapid atrial fibrillation with heart rate in the 150s - on amio infusion, covid + treated with remdesevir due to fever, hypoxia  Planned Discharge Destination: Home with Home Health    Time spent: 35 minutes  Author: Cresencio Fairly, MD 07/05/2024 7:28 PM  For on call review [email or web address].

## 2024-07-05 NOTE — Consult Note (Signed)
 Pharmacy Consult Note - Anticoagulation  Pharmacy Consult for heparin Indication: chest pain/ACS and atrial fibrillation  PATIENT MEASUREMENTS: Height: 5' 10 (177.8 cm) Weight: 77.1 kg (170 lb) IBW/kg (Calculated) : 73 HEPARIN DW (KG): 77.1  VITAL SIGNS: Temp: 98.6 F (37 C) (10/26 1959) Temp Source: Oral (10/26 1959) BP: 90/54 (10/26 1959) Pulse Rate: 73 (10/26 1959)  Recent Labs    07/03/24 1951 07/04/24 0330 07/04/24 1227 07/04/24 1516 07/04/24 1749 07/05/24 0831 07/05/24 1934  HGB  --  11.4*  --   --   --  13.1  --   HCT  --  34.1*  --   --   --  38.8*  --   PLT  --  110*  --   --   --  109*  --   APTT   < >  --   --   --  51* >160* 109*  LABPROT  --   --   --   --  14.9  --   --   INR  --   --   --   --  1.1  --   --   HEPARINUNFRC   < >  --   --   --  >1.10* 1.10*  --   CREATININE  --  0.87  --   --   --   --   --   TROPONINIHS  --   --    < > 1,099*  --   --   --    < > = values in this interval not displayed.    Estimated Creatinine Clearance: 74.6 mL/min (by C-G formula based on SCr of 0.87 mg/dL).  PAST MEDICAL HISTORY: Past Medical History:  Diagnosis Date   Bleeding hemorrhoids    a. 2015 - FOB+ stool, colonscopy - thrombosed int hemorrhoids.   Carpal tunnel syndrome    Diabetes mellitus without complication (HCC)    Hyperlipidemia    Nephrolithiasis    H/O stones   Rheumatoid arthritis (HCC)    Severe aortic stenosis    a. 05/2024 Echo: EF 50-55% without regional wall motion abnormalities, mild LVH, grade 1 diastolic dysfunction, normal RV function, mild MR, mild AI, and severe aortic stenosis with a mean aortic valve gradient of 40 mmHg, peak gradient of 62, and aortic valve area of 0.6 cm.    ASSESSMENT: 78 y.o. male with PMH including severe aortic stenosis is presenting with NSTEMI and Afib. Patient was started on Eliquis for Afib and new LBBB last administered at 1136 on 10/25. Calculated CHA2DS2VASc of 3 (Age +2, DM). Troponins 855 > 1,099.  Pharmacy has been consulted to initiate and manage heparin intravenous infusion.  Baseline labs 10/25: H&H 11.4/34.1, PLT 110, aPTT 51  Pertinent medications: Eliquis 5mg  last administered on 10/25 at 1136  Goal(s) of therapy: Heparin level 0.3 - 0.7 units/mL aPTT 66 - 102 seconds Monitor platelets by anticoagulation protocol: Yes   Baseline anticoagulation labs: Recent Labs    07/03/24 1629 07/03/24 1951 07/04/24 0330 07/04/24 1749 07/05/24 0831 07/05/24 1934  APTT  --   --   --  51* >160* 109*  INR  --  1.0  --  1.1  --   --   HGB 12.5*  --  11.4*  --  13.1  --   PLT 127*  --  110*  --  109*  --     Date Time aPTT/HL Rate/Comment 10/26 0831    aPTT>160  SUPRAtherapeutic @ 1000 units/hr 10/26 1934 aPTT 109 SUPRAtherapeutic @ 800 un/hr  PLAN: Decrease heparin infusion to 700 units/hour Check aPTT 8 hours from rate change Daily heparin level to assess for correlation with aPTT Monitor CBC daily while on heparin infusion  Will M. Lenon, PharmD, BCPS Clinical Pharmacist 07/05/2024 8:06 PM

## 2024-07-05 NOTE — Plan of Care (Signed)

## 2024-07-05 NOTE — Progress Notes (Signed)
 PT Cancellation Note  Patient Details Name: Donald [name] MRN: [id number] DOB: 1945-12-31   Cancelled Treatment:    Reason Eval/Treat Not Completed: Medical issues which prohibited therapy Spoke with MD, he reports still trying to improve HR and he asks to hold PT today.  Will maintain on caseload and attempt tomorrow as appropriate.  [name], DPT 07/05/2024, 10:25 AM

## 2024-07-05 NOTE — Consult Note (Signed)
 Pharmacy Consult Note - Anticoagulation  Pharmacy Consult for heparin Indication: chest pain/ACS and atrial fibrillation  PATIENT MEASUREMENTS: Height: 5' 10 (177.8 cm) Weight: 77.1 kg (170 lb) IBW/kg (Calculated) : 73 HEPARIN DW (KG): 77.1  VITAL SIGNS: Temp: 98.7 F (37.1 C) (10/26 0808) Temp Source: Oral (10/26 0808) BP: 150/80 (10/26 0808) Pulse Rate: 94 (10/26 0808)  Recent Labs    07/03/24 1951 07/04/24 0330 07/04/24 1227 07/04/24 1516 07/04/24 1749 07/05/24 0831  HGB  --  11.4*  --   --   --  13.1  HCT  --  34.1*  --   --   --  38.8*  PLT  --  110*  --   --   --  109*  APTT   < >  --   --   --  51* >160*  LABPROT  --   --   --   --  14.9  --   INR  --   --   --   --  1.1  --   HEPARINUNFRC   < >  --   --   --  >1.10* 1.10*  CREATININE  --  0.87  --   --   --   --   TROPONINIHS  --   --    < > 1,099*  --   --    < > = values in this interval not displayed.    Estimated Creatinine Clearance: 74.6 mL/min (by C-G formula based on SCr of 0.87 mg/dL).  PAST MEDICAL HISTORY: Past Medical History:  Diagnosis Date   Bleeding hemorrhoids    a. 2015 - FOB+ stool, colonscopy - thrombosed int hemorrhoids.   Carpal tunnel syndrome    Diabetes mellitus without complication (HCC)    Hyperlipidemia    Nephrolithiasis    H/O stones   Rheumatoid arthritis (HCC)    Severe aortic stenosis    a. 05/2024 Echo: EF 50-55% without regional wall motion abnormalities, mild LVH, grade 1 diastolic dysfunction, normal RV function, mild MR, mild AI, and severe aortic stenosis with a mean aortic valve gradient of 40 mmHg, peak gradient of 62, and aortic valve area of 0.6 cm.    ASSESSMENT: 78 y.o. male with PMH including severe aortic stenosis is presenting with NSTEMI and Afib. Patient was started on Eliquis for Afib and new LBBB last administered at 1136 on 10/25. Calculated CHA2DS2VASc of 3 (Age +2, DM). Troponins 855 > 1,099. Pharmacy has been consulted to initiate and manage  heparin intravenous infusion.  Baseline labs 10/25: H&H 11.4/34.1, PLT 110, aPTT 51  Pertinent medications: Eliquis 5mg  last administered on 10/25 at 1136  Goal(s) of therapy: Heparin level 0.3 - 0.7 units/mL aPTT 66 - 102 seconds Monitor platelets by anticoagulation protocol: Yes   Baseline anticoagulation labs: Recent Labs    07/03/24 1629 07/03/24 1951 07/04/24 0330 07/04/24 1749 07/05/24 0831  APTT  --   --   --  51* >160*  INR  --  1.0  --  1.1  --   HGB 12.5*  --  11.4*  --  13.1  PLT 127*  --  110*  --  109*    Date Time aPTT/HL Rate/Comment 10/25 0831    aPTT>160      SUPRAtherapeutic @ 1000 units/hr  PLAN: Hold heparin infusion for 1 hour until 1030 Decrease heparin infusion to 800 units/hour Check aPTT 8 hours from start of infusion (@1830 ) Monitor CBC daily while on heparin infusion  Dicky Boer  Virgilio, Student PharmD Clinical Pharmacy Intern 07/05/2024 9:32 AM

## 2024-07-05 NOTE — Progress Notes (Signed)
 Pt with heart rhythm that keeps changing, a-fib, svt, sinus tachy cardia to pauses. Rates from 110s-140s. MD [place] informed and said to continue to monitor. Currently resolved and showing sinus rhythm.

## 2024-07-05 NOTE — Progress Notes (Signed)
 Rounding Note   Patient Name: Donald [name] Date of Encounter: 07/05/2024  [facility] Cardiologist: Redell Cave, MD   Subjective Feels weak this morning, no desire to eat Continued episodes of atrial fibrillation this morning with RVR Asymptomatic from his A-fib, rates up to 150 Received dose of metoprolol tartrate yesterday afternoon, none since then Eliquis held, started on heparin infusion yesterday No family at the bedside  Scheduled Meds:  cyanocobalamin   1,000 mcg Oral Daily   insulin aspart  0-15 Units Subcutaneous TID WC   insulin aspart  0-5 Units Subcutaneous QHS   leflunomide  10 mg Oral Daily   loratadine  10 mg Oral Daily   magnesium oxide  400 mg Oral Daily   multivitamin with minerals  1 tablet Oral Daily   rivastigmine  1.5 mg Oral BID   rosuvastatin   10 mg Oral Daily   Continuous Infusions:  amiodarone 60 mg/hr (07/05/24 1048)   Followed by   amiodarone     heparin 800 Units/hr (07/05/24 1036)   PRN Meds: acetaminophen  **OR** acetaminophen , guaiFENesin-dextromethorphan, magnesium hydroxide, methocarbamol, ondansetron **OR** ondansetron (ZOFRAN) IV, traZODone   Vital Signs  Vitals:   07/05/24 1006 07/05/24 1044 07/05/24 1154 07/05/24 1200  BP:   (!) 148/132   Pulse:   (!) 154   Resp:  (!) 30 (!) 36 (!) 30  Temp:   98.6 F (37 C)   TempSrc:   Axillary   SpO2: 92%  93%   Weight:      Height:        Intake/Output Summary (Last 24 hours) at 07/05/2024 1235 Last data filed at 07/05/2024 1145 Gross per 24 hour  Intake 1594.78 ml  Output 1630 ml  Net -35.22 ml      07/03/2024    4:13 PM 06/08/2024   10:45 AM 03/12/2024    2:51 PM  Last 3 Weights  Weight (lbs) 170 lb 168 lb 3.2 oz 166 lb  Weight (kg) 77.111 kg 76.295 kg 75.297 kg      Telemetry Atrial fibrillation with RVR rate 140 bpm- Personally Reviewed  ECG   - Personally Reviewed  Physical Exam  GEN: No acute distress.  Appears pale Neck: No JVD Cardiac:  Irregularly irregular, rapid no murmurs, rubs, or gallops.  Respiratory: Clear to auscultation bilaterally. GI: Soft, nontender, non-distended  MS: No edema; No deformity. Neuro:  Nonfocal  Psych: Normal affect   Labs High Sensitivity Troponin:   Recent Labs  Lab 07/04/24 1227 07/04/24 1516  TROPONINIHS 855* 1,099*     Chemistry Recent Labs  Lab 07/03/24 1629 07/04/24 0330 07/04/24 1227  NA 137 138  --   K 4.5 3.8  --   CL 101 107  --   CO2 21* 22  --   GLUCOSE 150* 125*  --   BUN 23 22  --   CREATININE 1.24 0.87  --   CALCIUM  8.7* 7.3*  --   MG  --   --  2.0  GFRNONAA >60 >60  --   ANIONGAP 15 9  --     Lipids No results for input(s): CHOL, TRIG, HDL, LABVLDL, LDLCALC, CHOLHDL in the last 168 hours.  Hematology Recent Labs  Lab 07/03/24 1629 07/04/24 0330 07/05/24 0831  WBC 6.1 4.6 4.4  RBC 3.69* 3.36* 3.86*  HGB 12.5* 11.4* 13.1  HCT 37.8* 34.1* 38.8*  MCV 102.4* 101.5* 100.5*  MCH 33.9 33.9 33.9  MCHC 33.1 33.4 33.8  RDW 11.9 11.9 11.8  PLT 127* 110* 109*   Thyroid   Recent Labs  Lab 07/04/24 1227  TSH 0.217*    BNPNo results for input(s): BNP, PROBNP in the last 168 hours.  DDimer No results for input(s): DDIMER in the last 168 hours.   Radiology  ECHOCARDIOGRAM LIMITED Result Date: 07/04/2024    ECHOCARDIOGRAM LIMITED REPORT   Patient Name:   Donald [name] Date of Exam: 07/04/2024 Medical Rec #:  [id number]    Height:       70.0 in Accession #:    [id number]   Weight:       170.0 lb Date of Birth:  Feb 10, 1946    BSA:          1.948 m Patient Age:    78 years     BP:           146/68 mmHg Patient Gender: M            HR:           90 bpm. Exam Location:  [facility] Procedure: 2D Echo, Cardiac Doppler, Color Doppler and Intracardiac            Opacification Agent (Both Spectral and Color Flow Doppler were            utilized during procedure). Indications:     Heart block, LBBB I44.7, SOB, covid, NSTEMI  History:         Patient has prior  history of Echocardiogram examinations. Risk                  Factors:Diabetes.  Sonographer:     Bari Roar Referring Phys:  [id number] CHRISTOPHER RONALD BERGE Diagnosing Phys: Miciah Covelli MD IMPRESSIONS  1. Left ventricular ejection fraction, by estimation, is 35 to 40%. The left ventricle has moderately decreased function. The left ventricle demonstrates global hypokinesis. Left ventricular diastolic parameters are consistent with Grade I diastolic dysfunction (impaired relaxation).  2. Right ventricular systolic function is normal. The right ventricular size is normal.  3. The mitral valve is normal in structure. Mild mitral valve regurgitation. No evidence of mitral stenosis.  4. The aortic valve is calcified. Aortic valve regurgitation is not visualized. Severe aortic valve stenosis by Aortic valve area, by VTI measures 0.48 cm. Aortic valve mean gradient measures 24.0 mmHg and Aortic valve Vmax measures 3.27 m/s, likely underestimated in setting of cardiomyopathy.  5. The inferior vena cava is normal in size with greater than 50% respiratory variability, suggesting right atrial pressure of 3 mmHg. FINDINGS  Left Ventricle: Left ventricular ejection fraction, by estimation, is 35 to 40%. The left ventricle has moderately decreased function. The left ventricle demonstrates global hypokinesis. Definity contrast agent was given IV to delineate the left ventricular endocardial borders. The left ventricular internal cavity size was normal in size. There is no left ventricular hypertrophy. Left ventricular diastolic parameters are consistent with Grade I diastolic dysfunction (impaired relaxation). Right Ventricle: The right ventricular size is normal. No increase in right ventricular wall thickness. Right ventricular systolic function is normal. Left Atrium: Left atrial size was normal in size. Right Atrium: Right atrial size was normal in size. Pericardium: There is no evidence of pericardial effusion. Mitral  Valve: The mitral valve is normal in structure. Mild mitral valve regurgitation. No evidence of mitral valve stenosis. MV peak gradient, 8.9 mmHg. The mean mitral valve gradient is 4.0 mmHg. Tricuspid Valve: The tricuspid valve is normal in structure. Tricuspid valve regurgitation is mild . No evidence of tricuspid  stenosis. Aortic Valve: The aortic valve is normal in structure. Aortic valve regurgitation is not visualized. Severe aortic stenosis is present. Aortic valve mean gradient measures 24.0 mmHg. Aortic valve peak gradient measures 42.8 mmHg. Aortic valve area, by VTI measures 0.48 cm. Pulmonic Valve: The pulmonic valve was normal in structure. Pulmonic valve regurgitation is not visualized. No evidence of pulmonic stenosis. Aorta: The aortic root is normal in size and structure. Venous: The inferior vena cava is normal in size with greater than 50% respiratory variability, suggesting right atrial pressure of 3 mmHg. IAS/Shunts: No atrial level shunt detected by color flow Doppler. Additional Comments: Color Doppler performed.  LEFT VENTRICLE PLAX 2D LVIDd:         4.40 cm     Diastology LVIDs:         3.50 cm     LV e' medial:    5.33 cm/s LV PW:         1.00 cm     LV E/e' medial:  19.3 LV IVS:        1.30 cm     LV e' lateral:   6.20 cm/s LVOT diam:     1.80 cm     LV E/e' lateral: 16.6 LV SV:         31 LV SV Index:   16 LVOT Area:     2.54 cm  LV Volumes (MOD) LV vol d, MOD A2C: 84.3 ml LV vol d, MOD A4C: 76.1 ml LV vol s, MOD A2C: 57.2 ml LV vol s, MOD A4C: 43.5 ml LV SV MOD A2C:     27.1 ml LV SV MOD A4C:     76.1 ml LV SV MOD BP:      30.7 ml RIGHT VENTRICLE RV Basal diam:  2.80 cm RV Mid diam:    2.60 cm RV S prime:     13.40 cm/s TAPSE (M-mode): 2.3 cm LEFT ATRIUM             Index        RIGHT ATRIUM           Index LA diam:        4.00 cm 2.05 cm/m   RA Area:     11.10 cm LA Vol (A2C):   48.3 ml 24.79 ml/m  RA Volume:   22.20 ml  11.40 ml/m LA Vol (A4C):   50.3 ml 25.82 ml/m LA Biplane  Vol: 49.4 ml 25.36 ml/m  AORTIC VALVE                     PULMONIC VALVE AV Area (Vmax):    0.55 cm      PV Vmax:        0.95 m/s AV Area (Vmean):   0.53 cm      PV Peak grad:   3.6 mmHg AV Area (VTI):     0.48 cm      RVOT Peak grad: 2 mmHg AV Vmax:           327.00 cm/s AV Vmean:          228.000 cm/s AV VTI:            0.642 m AV Peak Grad:      42.8 mmHg AV Mean Grad:      24.0 mmHg LVOT Vmax:         71.00 cm/s LVOT Vmean:        47.300 cm/s LVOT VTI:  0.120 m LVOT/AV VTI ratio: 0.19  AORTA Ao Root diam: 2.80 cm MITRAL VALVE                TRICUSPID VALVE MV Area (PHT): 7.90 cm     TR Peak grad:   42.0 mmHg MV Area VTI:   1.77 cm     TR Vmax:        324.00 cm/s MV Peak grad:  8.9 mmHg MV Mean grad:  4.0 mmHg     SHUNTS MV Vmax:       1.49 m/s     Systemic VTI:  0.12 m MV Vmean:      89.7 cm/s    Systemic Diam: 1.80 cm MV Decel Time: 96 msec MV E velocity: 103.00 cm/s MV A velocity: 140.00 cm/s MV E/A ratio:  0.74 MV A Prime:    16.6 cm/s Evalene Lunger MD Electronically signed by Evalene Lunger MD Signature Date/Time: 07/04/2024/4:38:39 PM    Final    DG Chest Port 1 View Result Date: 07/03/2024 EXAM: 1 VIEW(S) XRAY OF THE CHEST 07/03/2024 08:11:00 PM COMPARISON: 12/04/2016 CLINICAL HISTORY: Questionable sepsis - evaluate for abnormality. FINDINGS: LUNGS AND PLEURA: No focal pulmonary opacity. No pulmonary edema. No pleural effusion. No pneumothorax. HEART AND MEDIASTINUM: Aortic atherosclerosis. No acute abnormality of the cardiac and mediastinal silhouettes. BONES AND SOFT TISSUES: No acute osseous abnormality. IMPRESSION: 1. No acute cardiopulmonary process. Electronically signed by: Pinkie Pebbles MD 07/03/2024 08:17 PM EDT RP Workstation: [id number]    Cardiac Studies Echo, see above  Patient Profile   Mr. Alex Leahy is a 78 year old gentleman with severe aortic valve stenosis, diabetes, hyperlipidemia, dementia, RA presenting to the hospital with weakness diagnosed with COVID,  paroxysmal atrial fibrillation with RVR noted on day 2 of admission   Assessment & Plan  Paroxysmal atrial fibrillation with RVR Asymptomatic, in the setting of COVID infection with generalized weakness, no respiratory distress, low-grade fever - Unable to exclude longstanding paroxysmal atrial fibrillation as he is asymptomatic - Did not receive metoprolol this morning -Will start metoprolol succinate 50 twice daily -Also start amiodarone bolus with infusion -Heparin infusion, Eliquis on hold given low ejection fraction, elevated troponin concerning for non-STEMI  Non-STEMI Troponin 800, repeat 1000 Etiology unclear, new left bundle branch block, now with depressed ejection fraction 35% Denies chest pain concerning for angina -Continue heparin infusion, aggressive measures to restore normal sinus rhythm -Unable to exclude viral myocarditis in the setting of COVID-positive -Depending on his recovery, may need to consider ischemic workup either inpatient or outpatient   COVID-19 infection Presenting with weakness, sore throat,  low-grade fever, general malaise, anorexia, cardiomyopathy No respiratory distress Antivirals per medicine team   Diabetes type 2 A1c 5.7 Managed by medicine team   Severe aortic valve stenosis Managed by Dr. Verlin in [place] Recent echocardiogram detailing severe AS    For questions or updates, please contact [facility] Please consult [email or web address] for contact info under   Signed, Vickki Igou, MD  07/05/2024, 12:35 PM

## 2024-07-05 NOTE — Consult Note (Signed)
 Consultation Note Date: 07/05/2024 at 1157  Patient Name: Donald [name]  DOB: Dec 14, 1945  MRN: [id number]  Age / Sex: 78 y.o., male  PCP: Glendia Shad, MD Referring Physician: Maree Hue, MD  HPI/Patient Profile: 78 y.o. male  with past medical history significant for DM II, dyslipidemia, aortic stenosis and RA. Patient presented to ED 07/03/2024 from home c/o acute onset generalized weakness, decreased PO intake, sore throat, cough and congestion.   Patient found to be Covid +. Labs significant for RBC 3.69, Hgb 12.5, CO2 21, Ca++ 8.7, lactic 2.3, UA with glucose, ketones and proteinuria, negative for infection. CXR with no acute cardiopulmonary process.   ED vitals 131/75, HR 103, RR 18, SpO2 99%, 99.25F  [facility] was consulted for admission and management of sepsis d/t Covid 19.   10/25 Patient found to have Afib with RVR, HR >135 and new RBBB. Cardiology consulted.   Palliative care consulted for assistance with goals of care discussions.   Clinical Assessment and Goals of Care: Extensive chart review completed prior to meeting patient including labs, vital signs, imaging, progress notes, orders, and available advanced directive documents from current and previous encounters. I then met with patient and called wife to discuss diagnosis prognosis, GOC, EOL wishes, disposition and options.      Latest Ref Rng & Units 07/05/2024    8:31 AM 07/04/2024    3:30 AM 07/03/2024    4:29 PM  CBC  WBC 4.0 - 10.5 K/uL 4.4  4.6  6.1   Hemoglobin 13.0 - 17.0 g/dL 86.8  88.5  87.4   Hematocrit 39.0 - 52.0 % 38.8  34.1  37.8   Platelets 150 - 400 K/uL 109  110  127       Latest Ref Rng & Units 07/04/2024    3:30 AM 07/03/2024    4:29 PM 04/01/2024   10:04 AM  CMP  Glucose 70 - 99 mg/dL 874  849    BUN 8 - 23 mg/dL 22  23    Creatinine 9.38 - 1.24 mg/dL 9.12  8.75    Sodium 864 - 145 mmol/L 138  137     Potassium 3.5 - 5.1 mmol/L 3.8  4.5  4.8   Chloride 98 - 111 mmol/L 107  101    CO2 22 - 32 mmol/L 22  21    Calcium  8.9 - 10.3 mg/dL 7.3  8.7     Ill-appearing, elderly male lying in bed. He awakens and states he feels terrible  and immediately falls back to sleep. Patient is too lethargic to participate in GOC conversation and stopped responding to any questions. Tachypnea noted with no respiratory distress.   Contacted wife via phone who states she is at [facility] being seen as she is sick as well. She will call back when she is available to talk.   1800: Patient's wife never returned call. Will contact her tomorrow to discuss goals of care for her husband.     Primary Decision Maker NEXT OF KIN- [name] Rouleau,  wife  Physical Exam Vitals reviewed.  Constitutional:      General: He is not in acute distress.    Appearance: He is ill-appearing.  HENT:     Head: Normocephalic and atraumatic.     Mouth/Throat:     Mouth: Mucous membranes are dry.  Pulmonary:     Effort: No respiratory distress.     Comments: Tachypnea  Skin:    General: Skin is warm and dry.  Neurological:     Mental Status: He is lethargic.   Recommendations/Plan: FULL CODE status as previously documented    Continue current supportive interventions Disposition TBD PMT to follow up with wife for GOC conversation  Palliative Assessment/Data: 60-70%    Thank you for this consult. Palliative medicine will continue to follow and assist holistically.   Time Total: 75 minutes  Time spent includes: Detailed review of medical records (labs, imaging, vital signs), medically appropriate exam (mental status, respiratory, cardiac, skin), discussed with treatment team, counseling and educating patient, family and staff, documenting clinical information, medication management and coordination of care.     Devere Sacks, AMANDA [facility] Palliative Medicine Team  07/05/2024 11:58 AM  Office [phone]  Pager  [phone]     Please contact Palliative Medicine Team providers via AMION for questions and concerns.

## 2024-07-06 DIAGNOSIS — I4891 Unspecified atrial fibrillation: Secondary | ICD-10-CM | POA: Diagnosis not present

## 2024-07-06 DIAGNOSIS — Z515 Encounter for palliative care: Secondary | ICD-10-CM

## 2024-07-06 DIAGNOSIS — E785 Hyperlipidemia, unspecified: Secondary | ICD-10-CM | POA: Diagnosis not present

## 2024-07-06 DIAGNOSIS — R0682 Tachypnea, not elsewhere classified: Secondary | ICD-10-CM | POA: Diagnosis not present

## 2024-07-06 DIAGNOSIS — M069 Rheumatoid arthritis, unspecified: Secondary | ICD-10-CM | POA: Diagnosis not present

## 2024-07-06 DIAGNOSIS — Z789 Other specified health status: Secondary | ICD-10-CM

## 2024-07-06 DIAGNOSIS — U071 COVID-19: Secondary | ICD-10-CM | POA: Diagnosis not present

## 2024-07-06 DIAGNOSIS — Z7189 Other specified counseling: Secondary | ICD-10-CM | POA: Diagnosis not present

## 2024-07-06 DIAGNOSIS — I480 Paroxysmal atrial fibrillation: Secondary | ICD-10-CM | POA: Diagnosis not present

## 2024-07-06 LAB — COMPREHENSIVE METABOLIC PANEL WITH GFR
ALT: 18 U/L (ref 0–44)
AST: 40 U/L (ref 15–41)
Albumin: 2.7 g/dL — ABNORMAL LOW (ref 3.5–5.0)
Alkaline Phosphatase: 28 U/L — ABNORMAL LOW (ref 38–126)
Anion gap: 10 (ref 5–15)
BUN: 22 mg/dL (ref 8–23)
CO2: 23 mmol/L (ref 22–32)
Calcium: 7.8 mg/dL — ABNORMAL LOW (ref 8.9–10.3)
Chloride: 99 mmol/L (ref 98–111)
Creatinine, Ser: 1.03 mg/dL (ref 0.61–1.24)
GFR, Estimated: >60 mL/min (ref >60)
Glucose, Bld: 121 mg/dL — ABNORMAL HIGH (ref 70–99)
Potassium: 3.8 mmol/L (ref 3.5–5.1)
Sodium: 132 mmol/L — ABNORMAL LOW (ref 135–145)
Total Bilirubin: 0.6 mg/dL (ref 0.0–1.2)
Total Protein: 5.5 g/dL — ABNORMAL LOW (ref 6.5–8.1)

## 2024-07-06 LAB — APTT: aPTT: 73 s — ABNORMAL HIGH (ref 24–36)

## 2024-07-06 LAB — CBC
HCT: 33.1 % — ABNORMAL LOW (ref 39.0–52.0)
Hemoglobin: 11.2 g/dL — ABNORMAL LOW (ref 13.0–17.0)
MCH: 33.8 pg (ref 26.0–34.0)
MCHC: 33.8 g/dL (ref 30.0–36.0)
MCV: 100 fL (ref 80.0–100.0)
Platelets: 100 K/uL — ABNORMAL LOW (ref 150–400)
RBC: 3.31 MIL/uL — ABNORMAL LOW (ref 4.22–5.81)
RDW: 11.8 % (ref 11.5–15.5)
WBC: 3.6 K/uL — ABNORMAL LOW (ref 4.0–10.5)
nRBC: 0 % (ref 0.0–0.2)

## 2024-07-06 LAB — GLUCOSE, CAPILLARY
Glucose-Capillary: 109 mg/dL — ABNORMAL HIGH (ref 70–99)
Glucose-Capillary: 124 mg/dL — ABNORMAL HIGH (ref 70–99)
Glucose-Capillary: 109 mg/dL — ABNORMAL HIGH (ref 70–99)
Glucose-Capillary: 161 mg/dL — ABNORMAL HIGH (ref 70–99)

## 2024-07-06 LAB — HEPARIN LEVEL (UNFRACTIONATED)
Heparin Unfractionated: 0.29 [IU]/mL — ABNORMAL LOW (ref 0.30–0.70)
Heparin Unfractionated: 0.27 [IU]/mL — ABNORMAL LOW (ref 0.30–0.70)

## 2024-07-06 MED ORDER — AMIODARONE HCL 200 MG PO TABS
400.0000 mg | ORAL_TABLET | Freq: Two times a day (BID) | ORAL | Status: DC
  Administered 2024-07-06 – 2024-07-07 (×2): 400 mg via ORAL
  Filled 2024-07-06 (×2): qty 2

## 2024-07-06 MED ORDER — HEPARIN BOLUS VIA INFUSION
1150.0000 [IU] | Freq: Once | INTRAVENOUS | Status: DC
  Filled 2024-07-06: qty 1150

## 2024-07-06 MED ORDER — AMIODARONE HCL 200 MG PO TABS
200.0000 mg | ORAL_TABLET | Freq: Every day | ORAL | Status: DC
Start: 2024-07-14 — End: 2024-07-07

## 2024-07-06 MED ORDER — AMOXICILLIN-POT CLAVULANATE 875-125 MG PO TABS
1.0000 | ORAL_TABLET | Freq: Two times a day (BID) | ORAL | Status: DC
  Administered 2024-07-06 – 2024-07-08 (×4): 1 via ORAL
  Filled 2024-07-06 (×4): qty 1

## 2024-07-06 MED ORDER — HEPARIN BOLUS VIA INFUSION
1200.0000 [IU] | Freq: Once | INTRAVENOUS | Status: AC
  Administered 2024-07-06: 1200 [IU] via INTRAVENOUS
  Filled 2024-07-06: qty 1200

## 2024-07-06 MED ORDER — AMIODARONE HCL 200 MG PO TABS: 400.0000 mg | ORAL_TABLET | Freq: Two times a day (BID) | ORAL | Status: DC

## 2024-07-06 MED ORDER — APIXABAN 5 MG PO TABS
5.0000 mg | ORAL_TABLET | Freq: Two times a day (BID) | ORAL | Status: DC
  Administered 2024-07-06 – 2024-07-15 (×18): 5 mg via ORAL
  Filled 2024-07-06 (×18): qty 1

## 2024-07-06 MED ORDER — HEPARIN BOLUS VIA INFUSION
1150.0000 [IU] | Freq: Once | INTRAVENOUS | Status: AC
  Administered 2024-07-06: 1150 [IU] via INTRAVENOUS
  Filled 2024-07-06: qty 1150

## 2024-07-06 NOTE — Consult Note (Signed)
 Pharmacy Consult Note - Anticoagulation  Pharmacy Consult for Apixaban (transition from heparin infusion) Indication: chest pain/ACS and atrial fibrillation  PATIENT MEASUREMENTS: Height: 5' 10 (177.8 cm) Weight: 77.1 kg (170 lb) IBW/kg (Calculated) : 73 HEPARIN DW (KG): 77.1  VITAL SIGNS: Temp: 98.1 F (36.7 C) (10/27 1625) Temp Source: Oral (10/27 1625) BP: 128/74 (10/27 1625) Pulse Rate: 81 (10/27 1625)  Recent Labs    07/04/24 1516 07/04/24 1749 07/05/24 0831 07/06/24 0420 07/06/24 1438  HGB  --   --    < > 11.2*  --   HCT  --   --    < > 33.1*  --   PLT  --   --    < > 100*  --   APTT  --  51*   < > 73*  --   LABPROT  --  14.9  --   --   --   INR  --  1.1  --   --   --   HEPARINUNFRC  --  >1.10*   < > 0.29* 0.27*  CREATININE  --   --   --  1.03  --   TROPONINIHS 1,099*  --   --   --   --    < > = values in this interval not displayed.    Estimated Creatinine Clearance: 63 mL/min (by C-G formula based on SCr of 1.03 mg/dL).  PAST MEDICAL HISTORY: Past Medical History:  Diagnosis Date   Bleeding hemorrhoids    a. 2015 - FOB+ stool, colonscopy - thrombosed int hemorrhoids.   Carpal tunnel syndrome    Diabetes mellitus without complication (HCC)    Hyperlipidemia    Nephrolithiasis    H/O stones   Rheumatoid arthritis (HCC)    Severe aortic stenosis    a. 05/2024 Echo: EF 50-55% without regional wall motion abnormalities, mild LVH, grade 1 diastolic dysfunction, normal RV function, mild MR, mild AI, and severe aortic stenosis with a mean aortic valve gradient of 40 mmHg, peak gradient of 62, and aortic valve area of 0.6 cm.    ASSESSMENT: 78 y.o. male with PMH including severe aortic stenosis is presenting with NSTEMI and Afib. Patient was started on Eliquis for Afib and new LBBB last administered at 1136 on 10/25. Calculated CHA2DS2VASc of 3 (Age +2, DM). Troponins 855 > 1,099. Pharmacy has been consulted to initiate and manage heparin intravenous  infusion.  Baseline labs 10/25: H&H 11.4/34.1, PLT 110, aPTT 51  Pertinent medications: Eliquis 5mg  last administered on 10/25 at 1136  Goal(s) of therapy: Heparin level 0.3 - 0.7 units/mL aPTT 66 - 102 seconds Monitor platelets by anticoagulation protocol: Yes   Baseline anticoagulation labs: Recent Labs    07/03/24 1951 07/04/24 0330 07/04/24 1749 07/04/24 1749 07/05/24 0831 07/05/24 1934 07/06/24 0420  APTT  --   --  51*   < > >160* 109* 73*  INR 1.0  --  1.1  --   --   --   --   HGB  --  11.4*  --   --  13.1  --  11.2*  PLT  --  110*  --   --  109*  --  100*   < > = values in this interval not displayed.    PLAN: Discontinue heparin and transition patient to apixaban 5 mg PO twice daily. Monitor CBC and Scr weekly unless more frequent monitoring is needed.   Lum VEAR Mania, PharmD, BCPS Clinical Pharmacist 07/06/2024 5:42 PM

## 2024-07-06 NOTE — Plan of Care (Signed)

## 2024-07-06 NOTE — TOC Initial Note (Signed)
 Transition of Care [facility]) - Initial/Assessment Note    Patient Details  Name: Donald [name] MRN: [id number] Date of Birth: 1946-05-27  Transition of [facility]) CM/SW Contact:    [name], LCSW Phone Number: 07/06/2024, 12:40 PM  Clinical Narrative:  Patient is on airborne isolation precautions. CSW called patient in the room, introduced role, and explained that discharge planning would be discussed. Patient is not interested in home health at this time. CSW encouraged him to contact his PCP if he changes his mind after discharge. DME recommendation for RW. Patient wants to see how he does without it first. No further concerns. CSW will continue to follow patient for support and facilitate return home once stable. Wife will transport him home at discharge.                Expected Discharge Plan: Home/Self Care Barriers to Discharge: Continued Medical Work up   Patient Goals and CMS Choice            Expected Discharge Plan and Services     Post Acute Care Choice: NA Living arrangements for the past 2 months: Single Family Home                                      Prior Living Arrangements/Services Living arrangements for the past 2 months: Single Family Home Lives with:: Spouse Patient language and need for interpreter reviewed:: Yes Do you feel safe going back to the place where you live?: Yes      Need for Family Participation in Patient Care: Yes (Comment) Care giver support system in place?: Yes (comment)   Criminal Activity/Legal Involvement Pertinent to Current Situation/Hospitalization: No - Comment as needed  Activities of Daily Living   ADL Screening (condition at time of admission) Independently performs ADLs?: No Does the patient have a NEW difficulty with bathing/dressing/toileting/self-feeding that is expected to last >3 days?: No Does the patient have a NEW difficulty with getting in/out of bed, walking, or climbing stairs that is expected to  last >3 days?: No Does the patient have a NEW difficulty with communication that is expected to last >3 days?: No Is the patient deaf or have difficulty hearing?: No Does the patient have difficulty seeing, even when wearing glasses/contacts?: No Does the patient have difficulty concentrating, remembering, or making decisions?: No  Permission Sought/Granted                  Emotional Assessment Appearance:: Appears stated age Attitude/Demeanor/Rapport: Engaged, Gracious Affect (typically observed): Appropriate, Calm, Pleasant Orientation: : Oriented to Self, Oriented to Place, Oriented to  Time, Oriented to Situation Alcohol / Substance Use: Not Applicable Psych Involvement: No (comment)  Admission diagnosis:  Tachypnea [R06.82] Weakness [R53.1] Sepsis, due to unspecified organism, unspecified whether acute organ dysfunction present (HCC) [A41.9] Sepsis due to COVID-19 (HCC) [U07.1, A41.89] COVID [U07.1] Acute cough [R05.1] Patient Active Problem List   Diagnosis Date Noted   Dilated cardiomyopathy (HCC) 07/05/2024   PAF (paroxysmal atrial fibrillation) (HCC) 07/05/2024   Sepsis (HCC) 07/04/2024   Tachypnea 07/04/2024   Weakness 07/04/2024   Atrial fibrillation with rapid ventricular response (HCC) 07/04/2024   Dementia without behavioral disturbance (HCC) 07/04/2024   Sepsis due to COVID-19 (HCC) 07/04/2024   COVID 07/03/2024   Controlled type 2 diabetes mellitus without complication, without long-term current use of insulin (HCC) 07/03/2024   Dyslipidemia 07/03/2024   Vitamin  B12 deficiency 07/03/2024   Colon cancer screening 03/13/2024   Type 2 diabetes mellitus with hyperglycemia (HCC) 04/30/2023   TIA (transient ischemic attack) 08/25/2022   Vivid dream 08/25/2022   Low back pain 07/18/2022   Elevated blood pressure reading 07/18/2022   Head trauma 07/17/2022   Expressive aphasia 07/17/2022   Anemia 01/10/2022   Decreased GFR 08/05/2018   Shingles 06/17/2018    Skin cancer 02/01/2018   Skin lesion of cheek 09/29/2017   Aortic stenosis 01/19/2017   Left carotid bruit 06/04/2016   Health care maintenance 12/06/2014   Hypercholesterolemia 11/29/2014   Heme positive stool 02/17/2014   Restless legs 11/29/2013   Diabetes (HCC) 06/07/2013   Nephrolithiasis 06/07/2013   Rheumatoid arthritis (HCC) 06/07/2013   Carpal tunnel syndrome 06/07/2013   PCP:  Glendia Shad, MD Pharmacy:   [facility] - [place], [place] - [address] [place] [address] Phone: [phone] Fax: [phone]  [facility] [address], [place] - [address] [place] [address] Phone: [phone] Fax: [phone]     Social Drivers of Health (SDOH) Social History: SDOH Screenings   Food Insecurity: No Food Insecurity (07/04/2024)  Housing: Low Risk  (07/04/2024)  Transportation Needs: No Transportation Needs (07/04/2024)  Utilities: Not At Risk (07/04/2024)  Alcohol Screen: Low Risk  (08/20/2023)  Depression (PHQ2-9): Low Risk  (08/20/2023)  Financial Resource Strain: Low Risk  (12/19/2023)   Received from [facility]  Physical Activity: Inactive (08/20/2023)  Social Connections: Moderately Integrated (07/04/2024)  Stress: No Stress Concern Present (08/20/2023)  Tobacco Use: Medium Risk (07/03/2024)  Health Literacy: Adequate Health Literacy (08/20/2023)   SDOH Interventions:     Readmission Risk Interventions     No data to display

## 2024-07-06 NOTE — Evaluation (Signed)
 Physical Therapy Evaluation Patient Details Name: Donald [name] MRN: [id number] DOB: December 15, 1945 Today's Date: 07/06/2024  History of Present Illness  Pt is a 78 y/o M admitted on 07/03/24 after presenting with acute onset of generalized weakness. Pt tested + for COVID. Pt also being treated for a-fib with RVR, NSTEMI. PMH: DM2, dyslipidemia, RA, aortic stenosis, dementia  Clinical Impression  Pt seen for PT evaluation with pt agreeable. Pt reports prior to admission he was independent without AD, denies falls, mowing his yard the day prior to admission. On this date, pt is able to complete bed mobility with min assist, short distance gait in room x 2 times without AD with min assist. VSS throughout session. Pt encouraged to sit in recliner, mobilize with staff. Recommend ongoing PT services to progress mobility to increase independence & reduce fall risk, facilitate return home with wife.        If plan is discharge home, recommend the following: A little help with walking and/or transfers;A little help with bathing/dressing/bathroom;Assistance with cooking/housework;Assist for transportation;Help with stairs or ramp for entrance   Can travel by private vehicle        Equipment Recommendations Rolling walker (2 wheels)  Recommendations for Other Services  Rehab consult;OT consult    Functional Status Assessment Patient has had a recent decline in their functional status and demonstrates the ability to make significant improvements in function in a reasonable and predictable amount of time.     Precautions / Restrictions Precautions Precautions: Fall Restrictions Weight Bearing Restrictions Per Provider Order: No      Mobility  Bed Mobility Overal bed mobility: Needs Assistance Bed Mobility: Supine to Sit     Supine to sit: Min assist, HOB elevated, Used rails (exit L side of bed)          Transfers Overall transfer level: Needs assistance Equipment used: None Transfers:  Sit to/from Stand, Bed to chair/wheelchair/BSC Sit to Stand: Contact guard assist   Step pivot transfers: Min assist (bed>recliner on L without AD)       General transfer comment: sit<>stand from EOB, recliner    Ambulation/Gait Ambulation/Gait assistance: Min assist Gait Distance (Feet): 12 Feet (+ 12 ft) Assistive device: None Gait Pattern/deviations: Decreased step length - right, Decreased step length - left, Decreased stride length Gait velocity: decreased     General Gait Details: BUE guarded  Stairs            Wheelchair Mobility     Tilt Bed    Modified Rankin (Stroke Patients Only)       Balance Overall balance assessment: Needs assistance Sitting-balance support: Feet supported Sitting balance-Leahy Scale: Fair     Standing balance support: During functional activity, No upper extremity supported Standing balance-Leahy Scale: Poor                               Pertinent Vitals/Pain Pain Assessment Pain Assessment: No/denies pain    Home Living Family/patient expects to be discharged to:: Private residence Living Arrangements: Spouse/significant other Available Help at Discharge: Family Type of Home: House Home Access: Stairs to enter   Secretary/administrator of Steps: 1 Alternate Level Stairs-Number of Steps: washer/dryer in basement Home Layout: Laundry or work area in basement;Able to live on main level with bedroom/bathroom;Two level Home Equipment: None      Prior Function Prior Level of Function : Driving;Independent/Modified Independent  Mobility Comments: denies falls, ambulatory without AD, was mowing his yard the day prior to admission.       Extremity/Trunk Assessment   Upper Extremity Assessment Upper Extremity Assessment: Overall WFL for tasks assessed    Lower Extremity Assessment Lower Extremity Assessment: Generalized weakness       Communication   Communication Communication: No  apparent difficulties    Cognition Arousal: Alert Behavior During Therapy: WFL for tasks assessed/performed   PT - Cognitive impairments: No apparent impairments                         Following commands: Intact       Cueing Cueing Techniques: Verbal cues     General Comments General comments (skin integrity, edema, etc.): Pt on 2L/min via nasal cannula, SpO2 99-100%, HR 72 bpm after gait. Assisted pt with changing into a clean gown. Educated pt on importance of upright sitting, OOB mobility with pt voicing understanding.    Exercises     Assessment/Plan    PT Assessment Patient needs continued PT services  PT Problem List Decreased strength;Cardiopulmonary status limiting activity;Decreased activity tolerance;Decreased balance;Decreased mobility;Decreased safety awareness;Decreased knowledge of use of DME;Decreased range of motion       PT Treatment Interventions DME instruction;Balance training;Modalities;Neuromuscular re-education;Gait training;Stair training;Cognitive remediation;Functional mobility training;Patient/family education;Therapeutic activities;Therapeutic exercise;Manual techniques    PT Goals (Current goals can be found in the Care Plan section)  Acute Rehab PT Goals Patient Stated Goal: get better PT Goal Formulation: With patient Time For Goal Achievement: 07/20/24 Potential to Achieve Goals: Good    Frequency Min 3X/week     Co-evaluation               AM-PAC PT 6 Clicks Mobility  Outcome Measure Help needed turning from your back to your side while in a flat bed without using bedrails?: A Little Help needed moving from lying on your back to sitting on the side of a flat bed without using bedrails?: A Lot Help needed moving to and from a bed to a chair (including a wheelchair)?: A Little Help needed standing up from a chair using your arms (e.g., wheelchair or bedside chair)?: A Little Help needed to walk in hospital room?: A  Little Help needed climbing 3-5 steps with a railing? : A Lot 6 Click Score: 16    End of Session Equipment Utilized During Treatment: Oxygen Activity Tolerance: Patient tolerated treatment well Patient left: in chair;with chair alarm set;with call bell/phone within reach Nurse Communication: Mobility status (O2) PT Visit Diagnosis: Unsteadiness on feet (R26.81);Muscle weakness (generalized) (M62.81);Difficulty in walking, not elsewhere classified (R26.2)    Time: 9077-9053 PT Time Calculation (min) (ACUTE ONLY): 24 min   Charges:   PT Evaluation $PT Eval Moderate Complexity: 1 Mod   PT General Charges $$ ACUTE PT VISIT: 1 Visit         Richerd Pinal, PT, DPT 07/06/24, 9:58 AM   Richerd CHRISTELLA Pinal 07/06/2024, 9:57 AM

## 2024-07-06 NOTE — Consult Note (Signed)
 Pharmacy Consult Note - Anticoagulation  Pharmacy Consult for heparin Indication: chest pain/ACS and atrial fibrillation  PATIENT MEASUREMENTS: Height: 5' 10 (177.8 cm) Weight: 77.1 kg (170 lb) IBW/kg (Calculated) : 73 HEPARIN DW (KG): 77.1  VITAL SIGNS: Temp: 98.5 F (36.9 C) (10/27 0310) Temp Source: Oral (10/27 0310) BP: 110/62 (10/27 0310) Pulse Rate: 78 (10/27 0310)  Recent Labs    07/04/24 1516 07/04/24 1749 07/05/24 0831 07/06/24 0420  HGB  --   --    < > 11.2*  HCT  --   --    < > 33.1*  PLT  --   --    < > 100*  APTT  --  51*   < > 73*  LABPROT  --  14.9  --   --   INR  --  1.1  --   --   HEPARINUNFRC  --  >1.10*   < > 0.29*  CREATININE  --   --   --  1.03  TROPONINIHS 1,099*  --   --   --    < > = values in this interval not displayed.    Estimated Creatinine Clearance: 63 mL/min (by C-G formula based on SCr of 1.03 mg/dL).  PAST MEDICAL HISTORY: Past Medical History:  Diagnosis Date   Bleeding hemorrhoids    a. 2015 - FOB+ stool, colonscopy - thrombosed int hemorrhoids.   Carpal tunnel syndrome    Diabetes mellitus without complication (HCC)    Hyperlipidemia    Nephrolithiasis    H/O stones   Rheumatoid arthritis (HCC)    Severe aortic stenosis    a. 05/2024 Echo: EF 50-55% without regional wall motion abnormalities, mild LVH, grade 1 diastolic dysfunction, normal RV function, mild MR, mild AI, and severe aortic stenosis with a mean aortic valve gradient of 40 mmHg, peak gradient of 62, and aortic valve area of 0.6 cm.    ASSESSMENT: 78 y.o. male with PMH including severe aortic stenosis is presenting with NSTEMI and Afib. Patient was started on Eliquis for Afib and new LBBB last administered at 1136 on 10/25. Calculated CHA2DS2VASc of 3 (Age +2, DM). Troponins 855 > 1,099. Pharmacy has been consulted to initiate and manage heparin intravenous infusion.  Baseline labs 10/25: H&H 11.4/34.1, PLT 110, aPTT 51  Pertinent medications: Eliquis 5mg   last administered on 10/25 at 1136  Goal(s) of therapy: Heparin level 0.3 - 0.7 units/mL aPTT 66 - 102 seconds Monitor platelets by anticoagulation protocol: Yes   Baseline anticoagulation labs: Recent Labs    07/03/24 1951 07/04/24 0330 07/04/24 1749 07/04/24 1749 07/05/24 0831 07/05/24 1934 07/06/24 0420  APTT  --   --  51*   < > >160* 109* 73*  INR 1.0  --  1.1  --   --   --   --   HGB  --  11.4*  --   --  13.1  --  11.2*  PLT  --  110*  --   --  109*  --  100*   < > = values in this interval not displayed.    Date Time aPTT/HL Rate/Comment 10/26 0831    aPTT>160      SUPRAtherapeutic @ 1000 units/hr 10/26 1934 aPTT 109 SUPRAtherapeutic @ 800 un/hr 10/27   0420    aPTT 73/ 0.29  SUBtherapeutic   PLAN: 10/27 @ 0420:  aPTT = 73,  HL = 0.29 - aPTT therapeutic X 2,  HL is SUBtherapeutic (barely) so most likely the  effect of Eliquis PTA has washed out, will use HL to guide dosing from here on  - will order heparin 1150 units IV X 1 and increase drip rate 850 units/hr - recheck HL 8 hrs after rate change  Monitor CBC daily while on heparin infusion  Leinaala Catanese D Clinical Pharmacist 07/06/2024 5:49 AM

## 2024-07-06 NOTE — Evaluation (Signed)
 Occupational Therapy Evaluation Patient Details Name: Donald [name] MRN: [id number] DOB: 28-Nov-1945 Today's Date: 07/06/2024   History of Present Illness   Pt is a 78 y/o M admitted on 07/03/24 after presenting with acute onset of generalized weakness. Pt tested + for COVID. Pt also being treated for a-fib with RVR, NSTEMI. PMH: DM2, dyslipidemia, RA, aortic stenosis, dementia     Clinical Impressions Pt was seen for OT evaluation this date. PTA, pt resides in a 2 level home with his wife and is IND without AD use at baseline. Pt presents with deficits in strength, mild balance and activity tolerance/cardiopulmonary status, affecting safe and optimal ADL completion. He is alert and oriented x4. Pt currently requires Min/CGA for STS from recliner to RW with cues for hand placement and initial posterior lean, however able to self correct. He ambulated ~30 ft using RW with CGA, slow shuffled gait pattern, however per wife this is his baseline. Simulated ADLs with Min/Mod A for LB ADL anticipated. Pt was weaned to RA during session with stable sp02 >90%. Pt will required skilled acute OT services to maximize his return to PLOF and address current functional deficits. OT to follow and recommend follow up therapy on DC.     If plan is discharge home, recommend the following:   A little help with walking and/or transfers;A little help with bathing/dressing/bathroom;Help with stairs or ramp for entrance;Assistance with cooking/housework;Assist for transportation     Functional Status Assessment   Patient has had a recent decline in their functional status and demonstrates the ability to make significant improvements in function in a reasonable and predictable amount of time.     Equipment Recommendations   BSC/3in1;Other (comment) (RW)     Recommendations for Other Services         Precautions/Restrictions   Precautions Precautions: Fall Recall of Precautions/Restrictions:  Intact Restrictions Weight Bearing Restrictions Per Provider Order: No     Mobility Bed Mobility               General bed mobility comments: NT up in recliner pre/post session    Transfers Overall transfer level: Needs assistance Equipment used: Rolling walker (2 wheels) Transfers: Sit to/from Stand Sit to Stand: Contact guard assist, Min assist           General transfer comment: increased time/cues to stand from recliner with mild posterior lean initially, that he self corrected with time and ambulated ~30 ft using RW with CGA, slow shuffled steps (baseline)      Balance Overall balance assessment: Needs assistance Sitting-balance support: Feet supported Sitting balance-Leahy Scale: Fair     Standing balance support: Reliant on assistive device for balance, Bilateral upper extremity supported Standing balance-Leahy Scale: Fair Standing balance comment: CGA + RW use                           ADL either performed or assessed with clinical judgement   ADL Overall ADL's : Needs assistance/impaired                     Lower Body Dressing: Minimal assistance Lower Body Dressing Details (indicate cue type and reason): simulated from recliner-anticipate Min A               General ADL Comments: Min/CGA with cues for hand placement to stand from recliner, with increased time to gain stability     Vision  Perception         Praxis         Pertinent Vitals/Pain Pain Assessment Pain Assessment: No/denies pain     Extremity/Trunk Assessment Upper Extremity Assessment Upper Extremity Assessment: Overall WFL for tasks assessed   Lower Extremity Assessment Lower Extremity Assessment: Generalized weakness       Communication Communication Communication: No apparent difficulties   Cognition Arousal: Alert Behavior During Therapy: WFL for tasks assessed/performed                                 Following  commands: Intact       Cueing  General Comments   Cueing Techniques: Verbal cues  able to wean to RA throughout session with stable sp02 readings   Exercises Other Exercises Other Exercises: Edu on role of OT in acute setting   Shoulder Instructions      Home Living Family/patient expects to be discharged to:: Private residence Living Arrangements: Spouse/significant other Available Help at Discharge: Family Type of Home: House Home Access: Stairs to enter Secretary/administrator of Steps: 1   Home Layout: Laundry or work area in basement;Able to live on main level with bedroom/bathroom;Two level Alternate Level Stairs-Number of Steps: washer/dryer in basement             Home Equipment: None          Prior Functioning/Environment Prior Level of Function : Driving;Independent/Modified Independent             Mobility Comments: denies falls, ambulatory without AD, was mowing his yard the day prior to admission.      OT Problem List: Decreased strength;Decreased activity tolerance;Impaired balance (sitting and/or standing)   OT Treatment/Interventions: Self-care/ADL training;Therapeutic exercise;Therapeutic activities;Energy conservation;Patient/family education;DME and/or AE instruction;Balance training      OT Goals(Current goals can be found in the care plan section)   Acute Rehab OT Goals Patient Stated Goal: get stronger OT Goal Formulation: With patient/family Time For Goal Achievement: 07/20/24 Potential to Achieve Goals: Good ADL Goals Pt Will Perform Lower Body Bathing: with supervision;sit to/from stand;sitting/lateral leans Pt Will Perform Lower Body Dressing: with supervision;sit to/from stand;sitting/lateral leans Pt Will Transfer to Toilet: with supervision;ambulating;with modified independence   OT Frequency:  Min 2X/week    Co-evaluation              AM-PAC OT 6 Clicks Daily Activity     Outcome Measure Help from another  person eating meals?: A Little Help from another person taking care of personal grooming?: A Little Help from another person toileting, which includes using toliet, bedpan, or urinal?: A Little Help from another person bathing (including washing, rinsing, drying)?: A Little Help from another person to put on and taking off regular upper body clothing?: A Little Help from another person to put on and taking off regular lower body clothing?: A Little 6 Click Score: 18   End of Session Equipment Utilized During Treatment: Gait belt;Rolling walker (2 wheels) Nurse Communication: Mobility status  Activity Tolerance: Patient tolerated treatment well Patient left: in chair;with call bell/phone within reach;with chair alarm set;with family/visitor present  OT Visit Diagnosis: Other abnormalities of gait and mobility (R26.89);Muscle weakness (generalized) (M62.81)                Time: 8849-8785 OT Time Calculation (min): 24 min Charges:  OT General Charges $OT Visit: 1 Visit OT Evaluation $OT Eval Moderate Complexity: 1 Mod OT  Treatments $Therapeutic Activity: 8-22 mins Jamilett Ferrante, OTR/L 07/06/24, 1:32 PM  Mattingly Fountaine E Breyson Kelm 07/06/2024, 1:26 PM

## 2024-07-06 NOTE — Consult Note (Addendum)
 Pharmacy Consult Note - Anticoagulation  Pharmacy Consult for heparin Indication: chest pain/ACS and atrial fibrillation  PATIENT MEASUREMENTS: Height: 5' 10 (177.8 cm) Weight: 77.1 kg (170 lb) IBW/kg (Calculated) : 73 HEPARIN DW (KG): 77.1  VITAL SIGNS: Temp: 98.5 F (36.9 C) (10/27 1211) Temp Source: Oral (10/27 1211) BP: 127/76 (10/27 1211) Pulse Rate: 74 (10/27 1211)  Recent Labs    07/04/24 1516 07/04/24 1749 07/05/24 0831 07/06/24 0420 07/06/24 1438  HGB  --   --    < > 11.2*  --   HCT  --   --    < > 33.1*  --   PLT  --   --    < > 100*  --   APTT  --  51*   < > 73*  --   LABPROT  --  14.9  --   --   --   INR  --  1.1  --   --   --   HEPARINUNFRC  --  >1.10*   < > 0.29* 0.27*  CREATININE  --   --   --  1.03  --   TROPONINIHS 1,099*  --   --   --   --    < > = values in this interval not displayed.    Estimated Creatinine Clearance: 63 mL/min (by C-G formula based on SCr of 1.03 mg/dL).  PAST MEDICAL HISTORY: Past Medical History:  Diagnosis Date   Bleeding hemorrhoids    a. 2015 - FOB+ stool, colonscopy - thrombosed int hemorrhoids.   Carpal tunnel syndrome    Diabetes mellitus without complication (HCC)    Hyperlipidemia    Nephrolithiasis    H/O stones   Rheumatoid arthritis (HCC)    Severe aortic stenosis    a. 05/2024 Echo: EF 50-55% without regional wall motion abnormalities, mild LVH, grade 1 diastolic dysfunction, normal RV function, mild MR, mild AI, and severe aortic stenosis with a mean aortic valve gradient of 40 mmHg, peak gradient of 62, and aortic valve area of 0.6 cm.    ASSESSMENT: 78 y.o. male with PMH including severe aortic stenosis is presenting with NSTEMI and Afib. Patient was started on Eliquis for Afib and new LBBB last administered at 1136 on 10/25. Calculated CHA2DS2VASc of 3 (Age +2, DM). Troponins 855 > 1,099. Pharmacy has been consulted to initiate and manage heparin intravenous infusion.  Baseline labs 10/25: H&H  11.4/34.1, PLT 110, aPTT 51  Pertinent medications: Eliquis 5mg  last administered on 10/25 at 1136  Goal(s) of therapy: Heparin level 0.3 - 0.7 units/mL aPTT 66 - 102 seconds Monitor platelets by anticoagulation protocol: Yes   Baseline anticoagulation labs: Recent Labs    07/03/24 1951 07/04/24 0330 07/04/24 1749 07/04/24 1749 07/05/24 0831 07/05/24 1934 07/06/24 0420  APTT  --   --  51*   < > >160* 109* 73*  INR 1.0  --  1.1  --   --   --   --   HGB  --  11.4*  --   --  13.1  --  11.2*  PLT  --  110*  --   --  109*  --  100*   < > = values in this interval not displayed.    Date Time aPTT/HL Rate/Comment 10/26 0831    aPTT>160      SUPRAtherapeutic @ 1000 units/hr 10/26 1934 aPTT 109 SUPRAtherapeutic @ 800 un/hr 10/27   0420    aPTT 73/ 0.29  SUBtherapeutic  10/27 1438 HL 0.27   PLAN: Heparin level is subtherapeutic. Will give a heparin bolus of 1200 units x 1. Increased the heparin infusion to 1000 units/hr. Recheck heparin level in 8 hours. CBC daily while on heparin.   Cathaleen GORMAN Blanch, PharmD, BCPS Clinical Pharmacist 07/06/2024 3:51 PM

## 2024-07-06 NOTE — Progress Notes (Incomplete)
 Heart Failure Stewardship Pharmacy Note  PCP: Glendia Shad, MD PCP-Cardiologist: Redell Cave, MD  HPI: Donald [name] is a 78 y.o. male with type 2 diabetes mellitus, dyslipidemia, rheumatoid arthritis, carpal tunnel, TIA 08/2022, and aortic stenosis who presented with generalized weakness, poor oral intake, and cough. On admission, BNP was not measured, TSH 0.217, HS-troponin was 855, and lactic acid was 2.3. Bcx negative. Positive for COVID-19 and initiated on remdesivir. Chest x-ray 07/03/24 noted no acute abnormalities.  Pertinent cardiac history: TTE 02/2017 showed LVEF 55-60% with G1DD and mild AS. AS progressed to moderate-severe on TTE 02/2022. TTE 10/2023 noted severe AS and persistent diastolic dysfunction. TTE this admission noted LVEF reduced to 35-40% with G1DD, mild MR, severe AS.  Pertinent Lab Values: Creatinine  Date Value Ref Range Status  03/30/2014 1.48 (H) 0.60 - 1.30 mg/dL Final   Creatinine, Ser  Date Value Ref Range Status  07/06/2024 1.03 0.61 - 1.24 mg/dL Final   BUN  Date Value Ref Range Status  07/06/2024 22 8 - 23 mg/dL Final  92/96/7974 23 8 - 27 mg/dL Final  92/78/7984 29 (H) 7 - 18 mg/dL Final   Potassium  Date Value Ref Range Status  07/06/2024 3.8 3.5 - 5.1 mmol/L Final  03/30/2014 4.8 3.5 - 5.1 mmol/L Final   Sodium  Date Value Ref Range Status  07/06/2024 132 (L) 135 - 145 mmol/L Final  03/12/2024 139 134 - 144 mmol/L Final  03/30/2014 137 136 - 145 mmol/L Final   Magnesium  Date Value Ref Range Status  07/04/2024 2.0 1.7 - 2.4 mg/dL Final    Comment:    Performed at [facility], [address] [place], [place] [address]   Hgb A1c MFr Bld  Date Value Ref Range Status  07/03/2024 5.7 (H) 4.8 - 5.6 % Final    Comment:    (NOTE) Diagnosis of Diabetes The following HbA1c ranges recommended by the American Diabetes Association (ADA) may be used as an aid in the diagnosis of diabetes mellitus.  Hemoglobin              Suggested A1C NGSP%              Diagnosis  <5.7                   Non Diabetic  5.7-6.4                Pre-Diabetic  >6.4                   Diabetic  <7.0                   Glycemic control for                       adults with diabetes.     TSH  Date Value Ref Range Status  07/04/2024 0.217 (L) 0.350 - 4.500 uIU/mL Final    Comment:    Performed by a 3rd Generation assay with a functional sensitivity of <=0.01 uIU/mL. Performed at [facility], [address] [place], [place] [address]   09/25/2023 0.88 0.35 - 5.50 uIU/mL Final    Vital Signs: Admission weight: Temp:  [98.5 F (36.9 C)-102.4 F (39.1 C)] 98.5 F (36.9 C) (10/27 0310) Pulse Rate:  [69-154] 78 (10/27 0310) Cardiac Rhythm: Normal sinus rhythm;Bundle branch block (10/26 1901) Resp:  [20-36] 22 (10/27 0310) BP: (90-150)/(54-132) 110/62 (10/27 0310) SpO2:  [  92 %-99 %] 94 % (10/27 0310)  Intake/Output Summary (Last 24 hours) at 07/06/2024 0728 Last data filed at 07/06/2024 0630 Gross per 24 hour  Intake 888.07 ml  Output 1000 ml  Net -111.93 ml    Current Heart Failure Medications:  Loop diuretic: none Beta-Blocker: metoprolol succinate 25 mg BID ACEI/ARB/ARNI: none MRA: none SGLT2i: none Other: none  Prior to admission Heart Failure Medications:  None  Assessment: 1. Acute systolic heart failure (LVEF 35-40%) with G1DD, due to unknown etiology. NYHA class *** symptoms.   - Plan: 1) Medication changes recommended at this time:  2) Patient assistance:   3) Education: -To be completed prior to discharge.  *** Medication Assistance / Insurance Benefits Check: Does the patient have prescription insurance?    Type of insurance plan:  Does the patient qualify for medication assistance through manufacturers or grants? {CHL AMB Yes/No/Pending:[id number]}  Eligible grants and/or patient assistance programs: ***  Medication assistance applications in progress: ***  Medication  assistance applications approved: *** Approved medication assistance renewals will be completed by: ***  Outpatient Pharmacy: Prior to admission outpatient pharmacy: ***      ***

## 2024-07-06 NOTE — Progress Notes (Signed)
 Progress Note  Patient Name: Donald [name] Date of Encounter: 07/06/2024 [facility] Cardiologist: Redell Cave, MD   Interval Summary    Patient is feeling better today. Nurse reports improved mental status. O2 down 5-3LO2. No chest pain reported.   Appears he converted to NSR 10/26 around 2PM. He has had intermittent brief pAfib, but mostly NSR.   Vital Signs Vitals:   07/05/24 2155 07/06/24 0035 07/06/24 0100 07/06/24 0310  BP: (!) 96/58 (!) 101/58  110/62  Pulse: 72 69  78  Resp:  (!) 26 (!) 25 (!) 22  Temp:  99.1 F (37.3 C)  98.5 F (36.9 C)  TempSrc:  Axillary  Oral  SpO2: 99% 98%  94%  Weight:      Height:        Intake/Output Summary (Last 24 hours) at 07/06/2024 0751 Last data filed at 07/06/2024 0630 Gross per 24 hour  Intake 888.07 ml  Output 1000 ml  Net -111.93 ml      07/03/2024    4:13 PM 06/08/2024   10:45 AM 03/12/2024    2:51 PM  Last 3 Weights  Weight (lbs) 170 lb 168 lb 3.2 oz 166 lb  Weight (kg) 77.111 kg 76.295 kg 75.297 kg      Telemetry/ECG  Afib>NSR 10/26 ~2pm with brief intermittent Afib episdoes.  - Personally Reviewed  Physical Exam  GEN: No acute distress.   Neck: No JVD Cardiac: RRR, no murmurs, rubs, or gallops.  Respiratory: diffusely diminished GI: Soft, nontender, non-distended  MS: No edema  CV studies: Echo limited 07/04/24 1. Left ventricular ejection fraction, by estimation, is 35 to 40%. The  left ventricle has moderately decreased function. The left ventricle  demonstrates global hypokinesis. Left ventricular diastolic parameters are  consistent with Grade I diastolic  dysfunction (impaired relaxation).   2. Right ventricular systolic function is normal. The right ventricular  size is normal.   3. The mitral valve is normal in structure. Mild mitral valve  regurgitation. No evidence of mitral stenosis.   4. The aortic valve is calcified. Aortic valve regurgitation is not  visualized. Severe  aortic valve stenosis by Aortic valve area, by VTI  measures 0.48 cm. Aortic valve mean gradient measures 24.0 mmHg and  Aortic valve Vmax measures 3.27 m/s, likely  underestimated in setting of cardiomyopathy.   5. The inferior vena cava is normal in size with greater than 50%  respiratory variability, suggesting right atrial pressure of 3 mmHg.   Assessment & Plan   Paroxysmal Afib with RVR - admitted 10/25 with COVID infection and Afib RVR found to have elevated troponin and reduced LVEF - unclear chronicity of afib as he is asymptomatic - Toprol 25mg BID - IV amiodarone - converted to NSR 10/26 ~2pm, with brief intermittent Pafib since then - IV heparin - Echo showed low EF, elevated troponin concerning for NSTEMI. Plan for heart cath - continue IV amio for now  NSTEMI - HS trop elevated to 1000 - EKG showed new LBBB - Echo showed new reduced LVEF 35% - no chest pain reported - continue IV heparin - will need heart cath, will discuss with MD timing of heart cath, may need to bo OP given COVID infection  HFrEF - Echo 11/04/23 showed LVEF 60-65%, mild MR, severe AS - Echo 05/2024 showed LVEF 50-55%, mild MR, severe AS - echo this admission showed LVEF 35-40%, G1DD, mild MD, severe AS - Cardiomyopathy etiology viral vs ischemic vs AS  - patient appears  euvolemic - continue Toprol 25mg BID - plan as above - GDMT as able, BP soft overnight  Severe AS - Echo 05/2024 showed LVEF 50-55%, mild MR, severe AS with mean gradient , AVA 0.6cm2, DI 0.18, SVI 25 - limited echo this admission showed severe AS, VTI 0.48cm2, mean gradient and Ao valve max 3.31m/s - this has been followed by [facility] - plan was for TAVR over the next 6-12 months  COVID 19 infection - per IM  GOC - patient has dementia without disturbance - palliative care following   For questions or updates, please contact [facility] Please consult [email or web address] for contact info under          Signed, Jamani Eley VEAR Fishman, PA-C

## 2024-07-06 NOTE — Progress Notes (Signed)
 Palliative Care Progress Note, Assessment & Plan   Patient Name: Donald [name]       Date: 07/06/2024 DOB: 1946/04/19  Age: 78 y.o. MRN#: [id number] Attending Physician: Maree Hue, MD Primary Care Physician: Glendia Shad, MD Admit Date: 07/03/2024  Subjective: Feels much better today.  Denies pain.  Reports eating breakfast this morning.  Unable to sleep last night.  Denies chest pain or shortness of breath.  HPI: 78 y.o. male  with past medical history significant for DM II, dyslipidemia, aortic stenosis and RA. Patient presented to ED 07/03/2024 from home c/o acute onset generalized weakness, decreased PO intake, sore throat, cough and congestion.    Patient found to be Covid +. Labs significant for RBC 3.69, Hgb 12.5, CO2 21, Ca++ 8.7, lactic 2.3, UA with glucose, ketones and proteinuria, negative for infection. CXR with no acute cardiopulmonary process.    ED vitals 131/75, HR 103, RR 18, SpO2 99%, 99.18F   TRH was consulted for admission and management of sepsis d/t Covid 19.    10/25 Patient found to have Afib with RVR, HR >135 and new RBBB. Cardiology consulted.    Palliative care consulted for assistance with goals of care discussions.   Summary of counseling/coordination of care: Extensive chart review completed prior to meeting patient including labs, vital signs, imaging, progress notes, orders, and available advanced directive documents from current and previous encounters.      Latest Ref Rng & Units 07/06/2024    4:20 AM 07/05/2024    8:31 AM 07/04/2024    3:30 AM  CBC  WBC 4.0 - 10.5 K/uL 3.6  4.4  4.6   Hemoglobin 13.0 - 17.0 g/dL 88.7  86.8  88.5   Hematocrit 39.0 - 52.0 % 33.1  38.8  34.1   Platelets 150 - 400 K/uL 100  109  110       Latest Ref Rng & Units 07/06/2024     4:20 AM 07/04/2024    3:30 AM 07/03/2024    4:29 PM  CMP  Glucose 70 - 99 mg/dL 878  874  849   BUN 8 - 23 mg/dL 22  22  23    Creatinine 0.61 - 1.24 mg/dL 8.96  9.12  8.75   Sodium 135 - 145 mmol/L 132  138  137   Potassium 3.5 - 5.1 mmol/L 3.8  3.8  4.5   Chloride 98 - 111 mmol/L 99  107  101   CO2 22 - 32 mmol/L 23  22  21    Calcium  8.9 - 10.3 mg/dL 7.8  7.3  8.7   Total Protein 6.5 - 8.1 g/dL 5.5     Total Bilirubin 0.0 - 1.2 mg/dL 0.6     Alkaline Phos 38 - 126 U/L 28     AST 15 - 41 U/L 40     ALT 0 - 44 U/L 18        After reviewing the patient's chart and assessing the patient at bedside, I spoke with patient in regards to symptom management and goals of care.  Patient's wife [name], returned call this morning for continuing goals of discussion.  Later assessed patient at bedside with wife present.  Ill-appearing, elderly male sitting upright in recliner with OT at bedside.  He is alert and oriented to self, time, location and situation.  He is able to participate in conversation making his wishes known.  Respirations are even and unlabored.  He is in no distress.  I introduced Palliative Medicine as specialized medical care for people living with serious illness. It focuses on providing relief from the symptoms and stress of a serious illness. The goal is to improve quality of life for both the patient and the family.  History provided by patient's wife [name]. We discussed a brief life review of the patient.  Donald [name] and his wife [name] have been married 37 years.  They have 5 children together from previous marriages.  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  As far as functional and nutritional status, [name] is independent of all ADLs in the home.  He drives and continues to do yard work independently as well.  He does not use DME for ambulation.  Patient's nutritional status was normal prior to this admission.  [name] states Mr. Neu was in his normal  state of health until he came ill with COVID and experienced a sudden weakness that prompted evaluation in the emergency department.  We discussed patient's current illness and what it means in the larger context of patient's on-going co-morbidities.  Natural disease trajectory and expectations at EOL were discussed.  I attempted to elicit values and goals of care important to the patient.  The most important thing at this time is to be able to return home.  The difference between aggressive medical intervention and comfort care was considered in light of the patient's goals of care.   Advance directives, concepts specific to code status and rehospitalization were considered and discussed.  Donald [name] confirms he wants CPR and ventilator if needed.  He remains a full CODE STATUS.  Education offered regarding concept specific to human mortality and the limitations of medical interventions to prolong life when the body begins to fail to thrive.  Family is facing treatment option decisions, advanced directive, and anticipatory care needs.  Donald [name] identifies his wife [name] as his surrogate decision maker in the event he were to become confused or unable to make decisions.  Donald [name] is not interested in STR at this time and wants to go home once he is well.   Discussed with patient/family the importance of continued conversation with family and the medical providers regarding overall plan of care and treatment options, ensuring decisions are within the context of the patient's values and GOCs.    Questions and concerns were addressed. The family was encouraged to call with questions or concerns.   Therapeutic silence and active listening provided for patient and wife to share their thoughts and emotions regarding current medical situation.  Emotional support provided.  Physical Exam Vitals reviewed.  Constitutional:      General: He is not in acute distress.    Appearance: He is ill-appearing.   HENT:     Head: Normocephalic and atraumatic.     Mouth/Throat:     Mouth: Mucous membranes are moist.  Pulmonary:     Effort: Pulmonary effort is normal. No respiratory distress.  Musculoskeletal:     Right lower leg: No edema.     Left lower leg: No edema.  Skin:    General: Skin is warm and dry.  Neurological:     Mental Status: He is  alert and oriented to person, place, and time.     Motor: Weakness present.  Psychiatric:        Mood and Affect: Mood normal.        Behavior: Behavior normal.        Thought Content: Thought content normal.        Judgment: Judgment normal.     Recommendations/Plan: FULL CODE status as previously documented    Continue current supportive interventions Discharge home when medically stable PMT will follow peripherally for needs  Total Time 65 minutes   Time spent includes: Detailed review of medical records (labs, imaging, vital signs), medically appropriate exam (mental status, respiratory, cardiac, skin), discussed with treatment team, counseling and educating patient, family and staff, documenting clinical information, medication management and coordination of care.     Devere Sacks, ELNITA- [facility] Palliative Medicine Team  07/06/2024 10:21 AM  Office [phone]  Pager [phone]

## 2024-07-06 NOTE — Progress Notes (Signed)
 Progress Note   Patient: Donald [name] FMW:[id number] DOB: May 19, 1946 DOA: 07/03/2024     2 DOS: the patient was seen and examined on 07/06/2024   Brief hospital course: 78 y.o. Caucasian male with medical history significant for type 2 diabetes mellitus, dyslipidemia, rheumatoid arthritis and aortic stenosis, who presented to the emergency room with acute onset of generalized weakness.  He has been having cough today with no dyspnea or wheezing.  He denied any chest pain or palpitations.  No fever or chills.  No nausea or vomiting or diarrhea or abdominal pain.  No dysuria,, urinary frequency or urgency, hematuria or flank pain.  He denied any headache or dizziness or blurred vision.  No paresthesias or focal muscle weakness.  He was able to mow his lawn yesterday however today he felt so weak he had to be assisted to standing.  No recent sick exposure.  He has been having significant diminished p.o. intake.  The patient denied any loss of taste or smell   ED Course: When he came to the ER, temperature was 99.1 and later 100.7, heart rate was 103 with otherwise normal vital signs.  Respiratory rate was normal and later 29.  BMP showed CO2 of 21 and blood glucose of 150 with calcium  of 8.7 and.  Lactic acid was 2.3 and later 1.4 and CBC showed hemoglobin 12.5 and hematocrit 37.8 with microcytosis.  PT and INR were normal.  Urinalysis showed 30 protein and 50 glucose and was otherwise unremarkable.  Respiratory panel came back positive for COVID-19.  Blood cultures were sent. EKG as reviewed by me :  EKG showed sinus tachycardia with a rate of 106 with nonspecific intraventricular conduction delay and Q waves anteroseptally with T wave inversion inferiorly.  Previous EKG on 11/15/2023 was normal Imaging: Portable chest x-ray showed no acute cardiopulmonary disease.   The patient was given 1 g p.o. Tylenol  and 1 g of IV Rocephin and 1 L bolus of IV lactated Ringer.  He will be admitted to a medical telemetry  observation bed for further evaluation and management.  10/25.  Called with heart rate in the 150s.  EKG showing atrial fibrillation with rapid ventricular response 158 bpm.  Will transfer to the progressive care unit.  Metoprolol 5 mg IV every hour as needed for heart rate greater than 135 and start oral metoprolol.  Cardiology consultation.    10/26: Started Remdesevir & Rocephin (due to fever 102.4 and hypoxia), getting blood c/s, CXR, Amiodarone (due to A.fib with RVR), metoprolol 10/27: Heart rate much better controlled.  He is on room air now and afebrile.  Stopped Rocephin  Assessment and Plan: * Atrial fibrillation with rapid ventricular response (HCC) Suspect paroxysmal in nature with COVID infection.  Patient asymptomatic with respect to fast heart beat.   Monitor on progressive care unit.   - Continue metoprolol succinate 25 mg twice daily - Continue amiodarone - Continue heparin for now.  May transition to oral Eliquis at discharge   Non-STEMI Troponin 1099 EKG with new left bundle branch block, now with depressed ejection fraction 35% Could be viral myocarditis from covid -Continue heparin infusion for now.  Transition to Eliquis at discharge - cardio will decide need for ischemic eval.  May be as outpatient  Symptomatic COVID infection with Hypoxia Fever 102.4 and hypoxia (needing 4 liter O2 via n.c.) on 10/26-chest x-ray on 10/26 concerning for pneumonia -Stop IV Rocephin and switch him to oral Augmentin for now He's not vaccinated for covid -  confirmed by family per nursing Now family also has covid +  Dyslipidemia On Crestor   Rheumatoid arthritis (HCC) Hold Arava due to ongoing infection concerns  Controlled type 2 diabetes mellitus without complication, without long-term current use of insulin (HCC) Hemoglobin A1c low at 5.7.   continue sliding scale insulin  Dementia without behavioral disturbance (HCC) On Exelon  Vitamin B12 deficiency Continue vitamin  B12  GOC Palliative care c/s - poor prognosis        Subjective: Feeling much better today  Physical Exam: Vitals:   07/06/24 0100 07/06/24 0310 07/06/24 0810 07/06/24 1211  BP:  110/62 113/61 127/76  Pulse:  78 71 74  Resp: (!) 25 (!) 22 18 18   Temp:  98.5 F (36.9 C) 98 F (36.7 C)   TempSrc:  Oral Oral   SpO2:  94% 98% 99%  Weight:      Height:       Physical Exam HENT:     Head: Normocephalic.     Mouth/Throat:     Pharynx: No oropharyngeal exudate.  Eyes:     General: Lids are normal.     Conjunctiva/sclera: Conjunctivae normal.  Cardiovascular:     Rate and Rhythm: Rhythm irregularly irregular.     Heart sounds: Normal heart sounds, S1 normal and S2 normal.  Pulmonary:     Breath sounds: No decreased breath sounds, wheezing or rales.  Abdominal:     Palpations: Abdomen is soft.     Tenderness: There is no abdominal tenderness.  Musculoskeletal:     Right lower leg: No swelling.     Left lower leg: No swelling.  Skin:    General: Skin is warm.     Findings: No rash.  Neurological:     Mental Status: He is alert.     Data Reviewed: EKG rapid atrial fibrillation 158 beats per minute, non specific st-t wave changes Family Communication: Wife updated at bedside  Disposition: Status is: Inpatient With rapid atrial fibrillation  - on amio infusion, covid + being treated with remdesevir due to fever, hypoxia  Planned Discharge Destination: Home with Home Health    Time spent: 35 minutes  Author: Cresencio Fairly, MD 07/06/2024 3:20 PM  For on call review [email or web address].

## 2024-07-07 DIAGNOSIS — U071 COVID-19: Secondary | ICD-10-CM | POA: Diagnosis not present

## 2024-07-07 DIAGNOSIS — I4891 Unspecified atrial fibrillation: Secondary | ICD-10-CM | POA: Diagnosis not present

## 2024-07-07 DIAGNOSIS — M069 Rheumatoid arthritis, unspecified: Secondary | ICD-10-CM | POA: Diagnosis not present

## 2024-07-07 DIAGNOSIS — E785 Hyperlipidemia, unspecified: Secondary | ICD-10-CM | POA: Diagnosis not present

## 2024-07-07 LAB — GLUCOSE, CAPILLARY
Glucose-Capillary: 103 mg/dL — ABNORMAL HIGH (ref 70–99)
Glucose-Capillary: 178 mg/dL — ABNORMAL HIGH (ref 70–99)
Glucose-Capillary: 129 mg/dL — ABNORMAL HIGH (ref 70–99)
Glucose-Capillary: 134 mg/dL — ABNORMAL HIGH (ref 70–99)

## 2024-07-07 LAB — BASIC METABOLIC PANEL WITH GFR
Anion gap: 11 (ref 5–15)
BUN: 30 mg/dL — ABNORMAL HIGH (ref 8–23)
CO2: 24 mmol/L (ref 22–32)
Calcium: 7.9 mg/dL — ABNORMAL LOW (ref 8.9–10.3)
Chloride: 99 mmol/L (ref 98–111)
Creatinine, Ser: 0.93 mg/dL (ref 0.61–1.24)
GFR, Estimated: >60 mL/min (ref >60)
Glucose, Bld: 121 mg/dL — ABNORMAL HIGH (ref 70–99)
Potassium: 4 mmol/L (ref 3.5–5.1)
Sodium: 134 mmol/L — ABNORMAL LOW (ref 135–145)

## 2024-07-07 LAB — CBC
HCT: 32 % — ABNORMAL LOW (ref 39.0–52.0)
Hemoglobin: 11.1 g/dL — ABNORMAL LOW (ref 13.0–17.0)
MCH: 33.8 pg (ref 26.0–34.0)
MCHC: 34.7 g/dL (ref 30.0–36.0)
MCV: 97.6 fL (ref 80.0–100.0)
Platelets: 109 K/uL — ABNORMAL LOW (ref 150–400)
RBC: 3.28 MIL/uL — ABNORMAL LOW (ref 4.22–5.81)
RDW: 11.9 % (ref 11.5–15.5)
WBC: 5.8 K/uL (ref 4.0–10.5)
nRBC: 0 % (ref 0.0–0.2)

## 2024-07-07 MED ORDER — AMIODARONE HCL IN DEXTROSE 360-4.14 MG/200ML-% IV SOLN
60.0000 mg/h | INTRAVENOUS | Status: AC
  Administered 2024-07-07: 60 mg/h via INTRAVENOUS

## 2024-07-07 MED ORDER — AMIODARONE HCL IN DEXTROSE 360-4.14 MG/200ML-% IV SOLN
30.0000 mg/h | INTRAVENOUS | Status: DC
  Administered 2024-07-07 – 2024-07-08 (×3): 30 mg/h via INTRAVENOUS
  Filled 2024-07-07 (×3): qty 200

## 2024-07-07 NOTE — Progress Notes (Signed)
 Occupational Therapy Treatment Patient Details Name: Donald [name] MRN: [id number] DOB: 1946-02-16 Today's Date: 07/07/2024   History of present illness Pt is a 78 y/o M admitted on 07/03/24 after presenting with acute onset of generalized weakness. Pt tested + for COVID. Pt also being treated for a-fib with RVR, NSTEMI. PMH: DM2, dyslipidemia, RA, aortic stenosis, dementia   OT comments  Pt seen for OT treatment on this date. Upon arrival to room pt semi supine in bed, agreeable to tx. Pt reports feeling disappointed about not D/C this AM, and eagerness to take a shower. Pt required MODA for bed mobility to come to the EOB to participate in bathing tasks with frequent cues for hand placement on bed rails. Pt required grading levels of assistance for bathing tasks, often verbal cues for sequencing and initiation throughout (details provided below). In standing for pericare pt had posterior LOB required MAXA for safe decent to return to EOB. Pt ambulated along the bed ~59ft with use of RW, completed STS from lowest bed height x5 STS with CGA + frequent verbal cues for hand placement and postural cues for lift off. Pt making fair progress toward goals, will continue to follow POC. Discharge recommendation remains appropriate.        If plan is discharge home, recommend the following:  A little help with walking and/or transfers;A little help with bathing/dressing/bathroom;Help with stairs or ramp for entrance;Assistance with cooking/housework;Assist for transportation   Equipment Recommendations  BSC/3in1;Other (comment)    Recommendations for Other Services      Precautions / Restrictions Precautions Precautions: Fall Recall of Precautions/Restrictions: Impaired Restrictions Weight Bearing Restrictions Per Provider Order: No       Mobility Bed Mobility Overal bed mobility: Needs Assistance Bed Mobility: Supine to Sit, Sit to Supine     Supine to sit: HOB elevated, Used rails, Mod  assist Sit to supine: Mod assist   General bed mobility comments: Cues for log rolling to return to bed due to chronic back pain, MODA with heavy cues for handplacement and sequence    Transfers Overall transfer level: Needs assistance Equipment used: Rolling walker (2 wheels) Transfers: Sit to/from Stand Sit to Stand: Contact guard assist, Min assist           General transfer comment: Frequent hand placement cues per each STS. Often posterior leaning as pt tend to lean backwards in sitting and standing.     Balance Overall balance assessment: Needs assistance Sitting-balance support: Feet supported Sitting balance-Leahy Scale: Fair Sitting balance - Comments: CGA-MINA for seated balance Postural control: Posterior lean Standing balance support: Reliant on assistive device for balance, Bilateral upper extremity supported Standing balance-Leahy Scale: Fair Standing balance comment: CGA-MINA + RW use                           ADL either performed or assessed with clinical judgement   ADL Overall ADL's : Needs assistance/impaired     Grooming: Wash/dry face;Wash/dry hands;Contact guard assist;Sitting Grooming Details (indicate cue type and reason): Wife assisted with apply deodorant Upper Body Bathing: Sitting;Contact guard assist   Lower Body Bathing: Sit to/from stand;Moderate assistance;Cueing for sequencing;Cueing for safety;With caregiver independent assisting Lower Body Bathing Details (indicate cue type and reason): Wife assisted with posterior pericare, pt completed anterior cleaning Upper Body Dressing : Minimal assistance;Sitting   Lower Body Dressing: Maximal assistance;Bed level Lower Body Dressing Details (indicate cue type and reason): Wife don/doffed LB dressing  Functional mobility during ADLs: Contact guard assist;Rolling walker (2 wheels);Minimal assistance General ADL Comments: MIN-MODA for bathing from EOB to standing,  frequent cues for postural control and hand placement for safety     Communication Communication Communication: Impaired Factors Affecting Communication: Difficulty expressing self   Cognition Arousal: Alert Behavior During Therapy: WFL for tasks assessed/performed Cognition: History of cognitive impairments, Cognition impaired   Orientation impairments: Situation, Place Awareness: Intellectual awareness impaired Memory impairment (select all impairments): Short-term memory Attention impairment (select first level of impairment): Focused attention, Sustained attention, Selective attention, Alternating attention, Divided attention Executive functioning impairment (select all impairments): Initiation, Organization, Sequencing, Reasoning, Problem solving OT - Cognition Comments: A/Ox2 ?place. Pt very delayed speech and task initiation throughout session                 Following commands: Impaired Following commands impaired: Follows multi-step commands inconsistently, Follows multi-step commands with increased time      Cueing   Cueing Techniques: Verbal cues, Tactile cues  Exercises Exercises: Other exercises Other Exercises Other Exercises: Edu: Role of OT, safe ADL completion           General Comments Pt reports eagerness to get into shower without assistance, pt educated on the need for stability and external support to prevent falls    Pertinent Vitals/ Pain       Pain Assessment Pain Assessment: No/denies pain                                                          Frequency  Min 2X/week        Progress Toward Goals  OT Goals(current goals can now be found in the care plan section)  Progress towards OT goals: Progressing toward goals  Acute Rehab OT Goals OT Goal Formulation: With patient/family Time For Goal Achievement: 07/20/24 Potential to Achieve Goals: Good ADL Goals Pt Will Perform Lower Body Bathing: with  supervision;sit to/from stand;sitting/lateral leans Pt Will Perform Lower Body Dressing: with supervision;sit to/from stand;sitting/lateral leans Pt Will Transfer to Toilet: with supervision;ambulating;with modified independence   AM-PAC OT 6 Clicks Daily Activity     Outcome Measure   Help from another person eating meals?: A Little Help from another person taking care of personal grooming?: A Little Help from another person toileting, which includes using toliet, bedpan, or urinal?: A Little Help from another person bathing (including washing, rinsing, drying)?: A Little Help from another person to put on and taking off regular upper body clothing?: A Little Help from another person to put on and taking off regular lower body clothing?: A Little 6 Click Score: 18    End of Session Equipment Utilized During Treatment: Gait belt;Rolling walker (2 wheels)  OT Visit Diagnosis: Other abnormalities of gait and mobility (R26.89);Muscle weakness (generalized) (M62.81)   Activity Tolerance Patient tolerated treatment well   Patient Left with call bell/phone within reach;with family/visitor present;in bed;with bed alarm set   Nurse Communication Mobility status        Time: 1041-1110 OT Time Calculation (min): 29 min  Charges: OT General Charges $OT Visit: 1 Visit OT Treatments $Self Care/Home Management : 8-22 mins $Therapeutic Activity: 8-22 mins  Larraine Colas M.S. OTR/L  07/07/24, 11:53 AM

## 2024-07-07 NOTE — Care Management Important Message (Signed)
 Important Message  Patient Details  Name: Donald [name] MRN: [id number] Date of Birth: [birth year]   Important Message Given:  Yes - Medicare IM     Rojelio SHAUNNA Rattler 07/07/2024, 3:32 PM

## 2024-07-07 NOTE — Progress Notes (Signed)
 Physical Therapy Treatment Patient Details Name: Donald [name] MRN: [id number] DOB: 03-17-1946 Today's Date: 07/07/2024   History of Present Illness Pt is a 78 y/o M admitted on 07/03/24 after presenting with acute onset of generalized weakness. Pt tested + for COVID. Pt also being treated for a-fib with RVR, NSTEMI. PMH: DM2, dyslipidemia, RA, aortic stenosis, dementia    PT Comments  Pt able to amb 43ft with minA for assistance with weight shift and intermittent RW management. Pt demonstrates difficulty with navigating obstacles in room and problem solving. SPT provided multi-modal cuing for hand placement and technique throughout session. Pt SpO2 and HR WNL throughout session. Would benefit from skilled PT to address above deficits and promote optimal return to PLOF.     If plan is discharge home, recommend the following: A little help with walking and/or transfers;A little help with bathing/dressing/bathroom;Assistance with cooking/housework;Assist for transportation;Help with stairs or ramp for entrance   Can travel by private vehicle        Equipment Recommendations  Rolling walker (2 wheels)    Recommendations for Other Services       Precautions / Restrictions Precautions Precautions: Fall Recall of Precautions/Restrictions: Impaired Restrictions Weight Bearing Restrictions Per Provider Order: No     Mobility  Bed Mobility Overal bed mobility: Needs Assistance Bed Mobility: Sit to Supine       Sit to supine: Contact guard assist   General bed mobility comments: Able to mangement LEs and trunk into bed without assistance. CGA for safety. Able to use bridging technique to reposition hips to center of bed    Transfers Overall transfer level: Needs assistance Equipment used: Rolling walker (2 wheels) Transfers: Sit to/from Stand Sit to Stand: Contact guard assist   Step pivot transfers: Min assist       General transfer comment: Cues for hand placement and  sequencing. Min A for step pivot transfer for RW management    Ambulation/Gait Ambulation/Gait assistance: Min assist Gait Distance (Feet): 50 Feet Assistive device: Rolling walker (2 wheels) Gait Pattern/deviations: Decreased step length - right, Decreased step length - left, Decreased stride length       General Gait Details: Pt demonstrates small shuffle steps when amb. Tactile cuing for increased step length. Min A for weight shift and RW mangement intermittently as pt demosntrates diffculty with problem solving to navigate obstacles in room.   Stairs             Wheelchair Mobility     Tilt Bed    Modified Rankin (Stroke Patients Only)       Balance Overall balance assessment: Needs assistance Sitting-balance support: Feet supported Sitting balance-Leahy Scale: Fair Sitting balance - Comments: CGA-SBA for seated balance at EOB   Standing balance support: Reliant on assistive device for balance, Bilateral upper extremity supported Standing balance-Leahy Scale: Fair Standing balance comment: CGA-min A with RW for safety. No LOB observed.                            Communication Communication Communication: Impaired Factors Affecting Communication: Difficulty expressing self  Cognition Arousal: Alert Behavior During Therapy: WFL for tasks assessed/performed   PT - Cognitive impairments: No apparent impairments                       PT - Cognition Comments: pleasant and agreeable to PT Following commands: Impaired Following commands impaired: Follows multi-step commands inconsistently, Follows multi-step commands with  increased time    Cueing Cueing Techniques: Verbal cues, Tactile cues  Exercises      General Comments General comments (skin integrity, edema, etc.): Pt on RA upon arrival. Vitals monitored throuhgout session. HR and SpO2 WNL throughout.      Pertinent Vitals/Pain Pain Assessment Pain Assessment: No/denies pain     Home Living                          Prior Function            PT Goals (current goals can now be found in the care plan section) Acute Rehab PT Goals PT Goal Formulation: With patient Time For Goal Achievement: 07/20/24 Potential to Achieve Goals: Good Progress towards PT goals: Progressing toward goals    Frequency    Min 3X/week      PT Plan      Co-evaluation              AM-PAC PT 6 Clicks Mobility   Outcome Measure  Help needed turning from your back to your side while in a flat bed without using bedrails?: A Little Help needed moving from lying on your back to sitting on the side of a flat bed without using bedrails?: A Lot Help needed moving to and from a bed to a chair (including a wheelchair)?: A Little Help needed standing up from a chair using your arms (e.g., wheelchair or bedside chair)?: A Little Help needed to walk in hospital room?: A Little Help needed climbing 3-5 steps with a railing? : A Lot 6 Click Score: 16    End of Session Equipment Utilized During Treatment: Gait belt Activity Tolerance: Patient tolerated treatment well Patient left: in bed;with call bell/phone within reach;with bed alarm set Nurse Communication: Mobility status PT Visit Diagnosis: Unsteadiness on feet (R26.81);Muscle weakness (generalized) (M62.81);Difficulty in walking, not elsewhere classified (R26.2)     Time: 8492-8472 PT Time Calculation (min) (ACUTE ONLY): 20 min  Charges:                            Mavrick Mcquigg, SPT    Lynze Reddy 07/07/2024, 4:58 PM

## 2024-07-07 NOTE — Progress Notes (Signed)
 Progress Note   Patient: Donald [name] FMW:[id number] DOB: Jan 20, 1946 DOA: 07/03/2024     3 DOS: the patient was seen and examined on 07/07/2024   Brief hospital course: 78 y.o. Caucasian male with medical history significant for type 2 diabetes mellitus, dyslipidemia, rheumatoid arthritis and aortic stenosis, who presented to the emergency room with acute onset of generalized weakness.  He has been having cough today with no dyspnea or wheezing.  He denied any chest pain or palpitations.  No fever or chills.  No nausea or vomiting or diarrhea or abdominal pain.  No dysuria,, urinary frequency or urgency, hematuria or flank pain.  He denied any headache or dizziness or blurred vision.  No paresthesias or focal muscle weakness.  He was able to mow his lawn yesterday however today he felt so weak he had to be assisted to standing.  No recent sick exposure.  He has been having significant diminished p.o. intake.  The patient denied any loss of taste or smell   ED Course: When he came to the ER, temperature was 99.1 and later 100.7, heart rate was 103 with otherwise normal vital signs.  Respiratory rate was normal and later 29.  BMP showed CO2 of 21 and blood glucose of 150 with calcium  of 8.7 and.  Lactic acid was 2.3 and later 1.4 and CBC showed hemoglobin 12.5 and hematocrit 37.8 with microcytosis.  PT and INR were normal.  Urinalysis showed 30 protein and 50 glucose and was otherwise unremarkable.  Respiratory panel came back positive for COVID-19.  Blood cultures were sent. EKG as reviewed by me :  EKG showed sinus tachycardia with a rate of 106 with nonspecific intraventricular conduction delay and Q waves anteroseptally with T wave inversion inferiorly.  Previous EKG on 11/15/2023 was normal Imaging: Portable chest x-ray showed no acute cardiopulmonary disease.   The patient was given 1 g p.o. Tylenol  and 1 g of IV Rocephin and 1 L bolus of IV lactated Ringer.  He will be admitted to a medical telemetry  observation bed for further evaluation and management.  10/25.  Called with heart rate in the 150s.  EKG showing atrial fibrillation with rapid ventricular response 158 bpm.  Will transfer to the progressive care unit.  Metoprolol 5 mg IV every hour as needed for heart rate greater than 135 and start oral metoprolol.  Cardiology consultation.    10/26: Started Remdesevir & Rocephin (due to fever 102.4 and hypoxia), getting blood c/s, CXR, Amiodarone (due to A.fib with RVR), metoprolol 10/27: Heart rate much better controlled.  He is on room air now and afebrile.  Stopped Rocephin 10/28: start PO Augmentin, remains on Amio drip, completed Remdesevir 3 days course, Heparin -> Eliquis  Assessment and Plan: * Atrial fibrillation with rapid ventricular response (HCC) Suspect paroxysmal in nature with COVID infection.  Patient asymptomatic with respect to fast heart beat.   Monitor on progressive care unit.   - Continue metoprolol succinate 25 mg twice daily - Continue amiodarone drip - Continue heparin for now.  May transition to oral Eliquis at discharge   Non-STEMI Troponin peaked at 1099 EKG with new left bundle branch block, now with depressed ejection fraction 35% Could be viral myocarditis from covid -Continue heparin infusion for now.  Transition to Eliquis at discharge - cardio will decide need for ischemic eval.  May be as outpatient  Symptomatic COVID infection with Hypoxia Fever 102.4 and hypoxia (needing 4 liter O2 via n.c.) on 10/26-chest x-ray on 10/26  concerning for pneumonia Treated with 3 days of IV remdesevir - now on RA, afebrile -Stop IV Rocephin and switched him to oral Augmentin  He's not vaccinated for covid - confirmed by family per nursing Now family also has covid +  Dyslipidemia On Crestor   Rheumatoid arthritis (HCC) Hold Arava due to ongoing infection concerns  Controlled type 2 diabetes mellitus without complication, without long-term current use of insulin  (HCC) Hemoglobin A1c low at 5.7.   continue sliding scale insulin  Dementia without behavioral disturbance (HCC) On Exelon  Vitamin B12 deficiency Continue vitamin B12  GOC Palliative care seen        Subjective: HR still fluctuating, feeling much better  Physical Exam: Vitals:   07/07/24 1109 07/07/24 1227 07/07/24 1638 07/07/24 2042  BP:  117/60 134/65 122/65  Pulse:  72 67 78  Resp: (!) 26 18 18 20   Temp:  98.2 F (36.8 C) 98.8 F (37.1 C) 98.2 F (36.8 C)  TempSrc:   Oral   SpO2:  96% 98% 95%  Weight:      Height:       Physical Exam HENT:     Head: Normocephalic.     Mouth/Throat:     Pharynx: No oropharyngeal exudate.  Eyes:     General: Lids are normal.     Conjunctiva/sclera: Conjunctivae normal.  Cardiovascular:     Rate and Rhythm: Rhythm irregularly irregular.     Heart sounds: Normal heart sounds, S1 normal and S2 normal.  Pulmonary:     Breath sounds: No decreased breath sounds, wheezing or rales.  Abdominal:     Palpations: Abdomen is soft.     Tenderness: There is no abdominal tenderness.  Musculoskeletal:     Right lower leg: No swelling.     Left lower leg: No swelling.  Skin:    General: Skin is warm.     Findings: No rash.  Neurological:     Mental Status: He is alert.     Family Communication: none at bedside  Disposition: Status is: Inpatient With rapid atrial fibrillation  - on amio infusion  Planned Discharge Destination: Home with Home Health    Time spent: 35 minutes  Author: Cresencio Fairly, MD 07/07/2024 9:04 PM  For on call review [email or web address].

## 2024-07-07 NOTE — Plan of Care (Signed)

## 2024-07-08 ENCOUNTER — Inpatient Hospital Stay

## 2024-07-08 DIAGNOSIS — I214 Non-ST elevation (NSTEMI) myocardial infarction: Secondary | ICD-10-CM

## 2024-07-08 DIAGNOSIS — A419 Sepsis, unspecified organism: Secondary | ICD-10-CM | POA: Diagnosis not present

## 2024-07-08 DIAGNOSIS — I420 Dilated cardiomyopathy: Secondary | ICD-10-CM | POA: Diagnosis not present

## 2024-07-08 DIAGNOSIS — J90 Pleural effusion, not elsewhere classified: Secondary | ICD-10-CM | POA: Diagnosis not present

## 2024-07-08 DIAGNOSIS — U071 COVID-19: Secondary | ICD-10-CM | POA: Diagnosis not present

## 2024-07-08 DIAGNOSIS — R918 Other nonspecific abnormal finding of lung field: Secondary | ICD-10-CM | POA: Diagnosis not present

## 2024-07-08 DIAGNOSIS — R051 Acute cough: Secondary | ICD-10-CM | POA: Diagnosis not present

## 2024-07-08 DIAGNOSIS — R0682 Tachypnea, not elsewhere classified: Secondary | ICD-10-CM | POA: Diagnosis not present

## 2024-07-08 DIAGNOSIS — I517 Cardiomegaly: Secondary | ICD-10-CM | POA: Diagnosis not present

## 2024-07-08 LAB — GLUCOSE, CAPILLARY
Glucose-Capillary: 132 mg/dL — ABNORMAL HIGH (ref 70–99)
Glucose-Capillary: 124 mg/dL — ABNORMAL HIGH (ref 70–99)
Glucose-Capillary: 155 mg/dL — ABNORMAL HIGH (ref 70–99)
Glucose-Capillary: 123 mg/dL — ABNORMAL HIGH (ref 70–99)

## 2024-07-08 LAB — CBC
HCT: 33.1 % — ABNORMAL LOW (ref 39.0–52.0)
Hemoglobin: 11.8 g/dL — ABNORMAL LOW (ref 13.0–17.0)
MCH: 34.7 pg — ABNORMAL HIGH (ref 26.0–34.0)
MCHC: 35.6 g/dL (ref 30.0–36.0)
MCV: 97.4 fL (ref 80.0–100.0)
Platelets: 125 K/uL — ABNORMAL LOW (ref 150–400)
RBC: 3.4 MIL/uL — ABNORMAL LOW (ref 4.22–5.81)
RDW: 11.8 % (ref 11.5–15.5)
WBC: 8.9 K/uL (ref 4.0–10.5)
nRBC: 0 % (ref 0.0–0.2)

## 2024-07-08 LAB — CULTURE, BLOOD (ROUTINE X 2)
Culture: NO GROWTH
Culture: NO GROWTH

## 2024-07-08 LAB — AMMONIA: Ammonia: 22 umol/L (ref 9–35)

## 2024-07-08 LAB — BLOOD GAS, VENOUS
Acid-Base Excess: 3.5 mmol/L — ABNORMAL HIGH (ref 0.0–2.0)
Bicarbonate: 26.7 mmol/L (ref 20.0–28.0)
O2 Saturation: 82.7 %
Patient temperature: 37
pCO2, Ven: 35 mmHg — ABNORMAL LOW (ref 44–60)
pH, Ven: 7.49 — ABNORMAL HIGH (ref 7.25–7.43)
pO2, Ven: 48 mmHg — ABNORMAL HIGH (ref 32–45)

## 2024-07-08 LAB — BASIC METABOLIC PANEL WITH GFR
Anion gap: 11 (ref 5–15)
BUN: 27 mg/dL — ABNORMAL HIGH (ref 8–23)
CO2: 23 mmol/L (ref 22–32)
Calcium: 7.9 mg/dL — ABNORMAL LOW (ref 8.9–10.3)
Chloride: 102 mmol/L (ref 98–111)
Creatinine, Ser: 0.96 mg/dL (ref 0.61–1.24)
GFR, Estimated: >60 mL/min (ref >60)
Glucose, Bld: 144 mg/dL — ABNORMAL HIGH (ref 70–99)
Potassium: 3.5 mmol/L (ref 3.5–5.1)
Sodium: 136 mmol/L (ref 135–145)

## 2024-07-08 MED ORDER — METOPROLOL SUCCINATE ER 50 MG PO TB24
50.0000 mg | ORAL_TABLET | Freq: Two times a day (BID) | ORAL | Status: DC
  Administered 2024-07-08 – 2024-07-15 (×14): 50 mg via ORAL
  Filled 2024-07-08 (×14): qty 1

## 2024-07-08 MED ORDER — METOPROLOL SUCCINATE ER 25 MG PO TB24
25.0000 mg | ORAL_TABLET | Freq: Once | ORAL | Status: AC
  Administered 2024-07-08: 25 mg via ORAL
  Filled 2024-07-08: qty 1

## 2024-07-08 MED ORDER — POTASSIUM CHLORIDE 20 MEQ PO PACK
20.0000 meq | PACK | Freq: Two times a day (BID) | ORAL | Status: AC
  Administered 2024-07-08 (×2): 20 meq via ORAL
  Filled 2024-07-08 (×2): qty 1

## 2024-07-08 MED ORDER — PIPERACILLIN-TAZOBACTAM 3.375 G IVPB
3.3750 g | Freq: Three times a day (TID) | INTRAVENOUS | Status: DC
  Administered 2024-07-08 – 2024-07-10 (×5): 3.375 g via INTRAVENOUS
  Filled 2024-07-08 (×5): qty 50

## 2024-07-08 NOTE — Progress Notes (Signed)
 Heart Failure Navigator Progress Note  Assessed for Heart & Vascular TOC clinic readiness. Spoke with Dr. Gollan this morning about patient POC.  Patient has Dementia. +Covid. Paroxysmal atrial fibrillation with RVR.  Will go to Dr. Verlin for a TAVR workup following this discharge.  No AHF TOC needed.  Navigator will sign off at this time.    Charmaine Pines, RN, BSN [facility] Heart Failure Navigator Secure Chat Only

## 2024-07-08 NOTE — Progress Notes (Signed)
 OT Cancellation Note  Patient Details Name: Donald [name] MRN: [id number] DOB: 12/04/1945   Cancelled Treatment:    Reason Eval/Treat Not Completed: Fatigue/lethargy limiting ability to participate. Per PT, pt lethargic and not able to participate in therapy with increased confusion. OT will hold treatment at this time and check back at later date as appropriate.   Champagne Paletta L. Emric Kowalewski, OTR/L  07/08/24, 3:04 PM

## 2024-07-08 NOTE — Progress Notes (Signed)
 Physical Therapy Treatment Patient Details Name: Donald [name] MRN: [id number] DOB: [birth year] Today's Date: 07/08/2024   History of Present Illness Pt is a 78 y/o M admitted on 07/03/24 after presenting with acute onset of generalized weakness. Pt tested + for COVID. Pt also being treated for a-fib with RVR, NSTEMI. PMH: DM2, dyslipidemia, RA, aortic stenosis, dementia    PT Comments  Pt limited by lethargy this session. Attempt to increase pt awareness with lights and TV on, windows open. Pt seems more confused compared to previous session. Pt required max A for supine<>sit for LE and trunk management. Multi modal cues provided for sequencing/hand placement, however pt not eager to participate and unable to perform the tasks asked. MD and nursing made aware via secure chat.   If plan is discharge home, recommend the following: A lot of help with walking and/or transfers;A lot of help with bathing/dressing/bathroom;Direct supervision/assist for medications management;Direct supervision/assist for financial management;Supervision due to cognitive status   Can travel by private vehicle     No  Equipment Recommendations  Other (comment) (TBD at next venue)    Recommendations for Other Services       Precautions / Restrictions Precautions Precautions: Fall Recall of Precautions/Restrictions: Impaired Restrictions Weight Bearing Restrictions Per Provider Order: No     Mobility  Bed Mobility Overal bed mobility: Needs Assistance Bed Mobility: Supine to Sit, Sit to Supine     Supine to sit: Max assist Sit to supine: Max assist   General bed mobility comments: MAX A for LE and trunk mangement for supine<>sit. Max cuing for participation, hand placement, sequencing. Pt unable to grasp bed rail to assist with supine<>sit.    Transfers Overall transfer level: Needs assistance                 General transfer comment: Unable to test d/t lethargy    Ambulation/Gait                General Gait Details: Unable to test d/t lethargy   Stairs             Wheelchair Mobility     Tilt Bed    Modified Rankin (Stroke Patients Only)       Balance Overall balance assessment: Needs assistance Sitting-balance support: Single extremity supported, Feet supported Sitting balance-Leahy Scale: Poor Sitting balance - Comments: min -mod A to maintain sitting balance at EOB. Pt maintained R UE on elevated HOB for support. Unable to maintain sitting balance without assistance despite cuing to correct. Pt actively trying to return to bed once up.       Standing balance comment: NT d/t lethargy                            Communication Communication Communication: Impaired Factors Affecting Communication: Reduced clarity of speech  Cognition Arousal: Lethargic Behavior During Therapy: WFL for tasks assessed/performed   PT - Cognitive impairments: History of cognitive impairments                       PT - Cognition Comments: Pt seems very lethargic today compared to yesterday keeping his eyes closed throughout the session. Required max cuing for participation and would only open his eyes when asked. Following commands: Impaired Following commands impaired: Follows one step commands with increased time, Follows multi-step commands inconsistently    Cueing Cueing Techniques: Verbal cues, Tactile cues  Exercises  General Comments General comments (skin integrity, edema, etc.): SpO2 and HR WNL. Pt on RA.      Pertinent Vitals/Pain Pain Assessment Pain Assessment: No/denies pain    Home Living                          Prior Function            PT Goals (current goals can now be found in the care plan section) Acute Rehab PT Goals PT Goal Formulation: With patient Time For Goal Achievement: 07/20/24 Potential to Achieve Goals: Good Progress towards PT goals: Not progressing toward goals - comment     Frequency    Min 3X/week      PT Plan      Co-evaluation              AM-PAC PT 6 Clicks Mobility   Outcome Measure  Help needed turning from your back to your side while in a flat bed without using bedrails?: A Little Help needed moving from lying on your back to sitting on the side of a flat bed without using bedrails?: A Lot Help needed moving to and from a bed to a chair (including a wheelchair)?: A Lot Help needed standing up from a chair using your arms (e.g., wheelchair or bedside chair)?: A Lot Help needed to walk in hospital room?: A Lot Help needed climbing 3-5 steps with a railing? : Total 6 Click Score: 12    End of Session   Activity Tolerance: Patient limited by lethargy Patient left: in bed;with call bell/phone within reach;with bed alarm set Nurse Communication: Mobility status PT Visit Diagnosis: Unsteadiness on feet (R26.81);Muscle weakness (generalized) (M62.81);Difficulty in walking, not elsewhere classified (R26.2)     Time: 1355-1410 PT Time Calculation (min) (ACUTE ONLY): 15 min  Charges:    $Therapeutic Activity: 8-22 mins PT General Charges $$ ACUTE PT VISIT: 1 Visit                     Luvenia Avers, SPT    Mikia Delaluz 07/08/2024, 3:01 PM

## 2024-07-08 NOTE — Progress Notes (Addendum)
  Progress Note   Patient: Donald [name] FMW:[id number] DOB: [birth year] DOA: 07/03/2024     4 DOS: the patient was seen and examined on 07/08/2024   Brief hospital course: 78 y.o. Caucasian male with medical history significant for type 2 diabetes mellitus, dyslipidemia, rheumatoid arthritis and aortic stenosis, presented with acute onset of generalized weakness.  Patient admitted for sepsis due to COVID infection.  Hospital course complicated by A-fib with RVR, seen by cardiology. Hospital course as below   Assessment and Plan: Atrial fibrillation with rapid ventricular response - Suspect paroxysmal in nature with COVID infection. Patient asymptomatic with respect to fast heart beat  - Inc metoprolol succinate 50 mg twice daily - Continue IV Amiodarone - IV heparin changed to Eliquis - Cardiology following, appreciate recs   Non-STEMI Troponin peaked at 1099 EKG with new left bundle branch block, now with depressed ejection fraction 35% Outpatient ischemic workup per cardiolgoy  Symptomatic COVID infection with Hypoxia Fever 102.4 and hypoxia (needing 4 liter O2 via n.c.) on 10/26-chest x-ray on 10/26 concerning for pneumonia Treated with 3 days of IV remdesevir - now on RA, afebrile -Stop IV Rocephin and switched him to oral Augmentin  He's not vaccinated for covid - confirmed by family per nursing Now family also has covid +  -- Addendum: tachypnea and encephalopathic today, will change Augmentin to Zosyn, MRSA nares  Acute encephalopathy - Check UA, CXR.  Not on opioids - Pending VBG, ammonia level - Delirium precautions  Dyslipidemia On Crestor   Rheumatoid arthritis (HCC) Hold Arava due to ongoing infection concerns  Controlled type 2 diabetes mellitus without complication, without long-term current use of insulin (HCC) Hemoglobin A1c low at 5.7.   continue sliding scale insulin  Dementia without behavioral disturbance (HCC) On Exelon  Vitamin B12  deficiency Continue vitamin B12  GOC Palliative care seen   Subjective: HR still fluctuating, inc metoprolol Confused and drowsy, PT informed patient mental status worse today Work up pending  Physical Exam: Vitals:   07/07/24 2042 07/08/24 0004 07/08/24 0320 07/08/24 0826  BP: 122/65 130/80 (!) 143/66 (!) 146/66  Pulse: 78 71 79 85  Resp: 20 18 20 18   Temp: 98.2 F (36.8 C) 98.9 F (37.2 C) 98.6 F (37 C) 98.3 F (36.8 C)  TempSrc:      SpO2: 95% 94% 90% 95%  Weight:      Height:       Physical Exam Cardiovascular:     Rate and Rhythm: Rhythm irregularly irregular.     Heart sounds: Normal heart sounds, S1 normal and S2 normal.  Pulmonary:     Breath sounds: No decreased breath sounds, wheezing or rales.  Abdominal:     Palpations: Abdomen is soft.     Tenderness: There is no abdominal tenderness.  Musculoskeletal:     Right lower leg: No swelling.     Left lower leg: No swelling.  Skin:    General: Skin is warm.     Findings: No rash.  Neurological:     Mental Status: Confused, mild lethargy    Family Communication: Wife at bedside  Disposition: Status is: Inpatient With rapid atrial fibrillation  - on amio infusion  Planned Discharge Destination: Home with Home Health    Time spent: 55 minutes  Author: Laree Lock, MD 07/08/2024 9:47 AM  For on call review [email or web address].

## 2024-07-08 NOTE — Progress Notes (Signed)
 Rounding Note   Patient Name: Donald [name] Date of Encounter: 07/08/2024  [facility] Cardiologist: Redell Cave, MD   Subjective This morning on rounds, had spilled his ice water down his front, holding the sheet motionless, trying to clean up Otherwise no complaints, appetite slowly improving Still with chest congestion, feels breathing slowly improving More alert, communicative today compared to several days ago  Placed back on amiodarone infusion for frequent paroxysmal atrial fibrillation runs rate up to 130s   Scheduled Meds:  amoxicillin-clavulanate  1 tablet Oral Q12H   apixaban  5 mg Oral BID   cyanocobalamin   1,000 mcg Oral Daily   insulin aspart  0-15 Units Subcutaneous TID WC   insulin aspart  0-5 Units Subcutaneous QHS   loratadine  10 mg Oral Daily   magnesium oxide  400 mg Oral Daily   metoprolol succinate  50 mg Oral BID   multivitamin with minerals  1 tablet Oral Daily   potassium chloride  20 mEq Oral BID   rivastigmine  1.5 mg Oral BID   rosuvastatin   10 mg Oral Daily   Continuous Infusions:  amiodarone 30 mg/hr (07/08/24 0551)   PRN Meds: acetaminophen  **OR** acetaminophen , guaiFENesin-dextromethorphan, magnesium hydroxide, methocarbamol, ondansetron **OR** ondansetron (ZOFRAN) IV, traZODone   Vital Signs  Vitals:   07/08/24 0004 07/08/24 0320 07/08/24 0826 07/08/24 1055  BP: 130/80 (!) 143/66 (!) 146/66 116/72  Pulse: 71 79 85 82  Resp: 18 20 18 18   Temp: 98.9 F (37.2 C) 98.6 F (37 C) 98.3 F (36.8 C) 98.5 F (36.9 C)  TempSrc:      SpO2: 94% 90% 95% 92%  Weight:      Height:        Intake/Output Summary (Last 24 hours) at 07/08/2024 1319 Last data filed at 07/08/2024 1300 Gross per 24 hour  Intake 909.17 ml  Output 150 ml  Net 759.17 ml      07/03/2024    4:13 PM 06/08/2024   10:45 AM 03/12/2024    2:51 PM  Last 3 Weights  Weight (lbs) 170 lb 168 lb 3.2 oz 166 lb  Weight (kg) 77.111 kg 76.295 kg 75.297 kg       Telemetry Normal sinus rhythm with frequent runs of paroxysmal atrial fibrillation rates up to 130- Personally Reviewed  ECG   - Personally Reviewed  Physical Exam  GEN: No acute distress.   Neck: No JVD Cardiac: RRR, no murmurs, rubs, or gallops.  Respiratory: Clear to auscultation bilaterally. GI: Soft, nontender, non-distended  MS: No edema; No deformity. Neuro:  Nonfocal  Psych: Normal affect   Labs High Sensitivity Troponin:   Recent Labs  Lab 07/04/24 1227 07/04/24 1516  TROPONINIHS 855* 1,099*     Chemistry Recent Labs  Lab 07/04/24 1227 07/06/24 0420 07/07/24 0526 07/08/24 0533  NA  --  132* 134* 136  K  --  3.8 4.0 3.5  CL  --  99 99 102  CO2  --  23 24 23   GLUCOSE  --  121* 121* 144*  BUN  --  22 30* 27*  CREATININE  --  1.03 0.93 0.96  CALCIUM   --  7.8* 7.9* 7.9*  MG 2.0  --   --   --   PROT  --  5.5*  --   --   ALBUMIN  --  2.7*  --   --   AST  --  40  --   --   ALT  --  18  --   --   ALKPHOS  --  28*  --   --   BILITOT  --  0.6  --   --   GFRNONAA  --  >60 >60 >60  ANIONGAP  --  10 11 11     Lipids No results for input(s): CHOL, TRIG, HDL, LABVLDL, LDLCALC, CHOLHDL in the last 168 hours.  Hematology Recent Labs  Lab 07/06/24 0420 07/07/24 0526 07/08/24 0533  WBC 3.6* 5.8 8.9  RBC 3.31* 3.28* 3.40*  HGB 11.2* 11.1* 11.8*  HCT 33.1* 32.0* 33.1*  MCV 100.0 97.6 97.4  MCH 33.8 33.8 34.7*  MCHC 33.8 34.7 35.6  RDW 11.8 11.9 11.8  PLT 100* 109* 125*   Thyroid   Recent Labs  Lab 07/04/24 1227  TSH 0.217*    BNPNo results for input(s): BNP, PROBNP in the last 168 hours.  DDimer No results for input(s): DDIMER in the last 168 hours.   Radiology  No results found.  Cardiac Studies    Patient Profile   Mr. Filipe Greathouse is a 78 year old gentleman with severe aortic valve stenosis, diabetes, hyperlipidemia, dementia, RA presenting to the hospital with weakness diagnosed with COVID, paroxysmal atrial fibrillation  with RVR noted on day 2 of admission    Assessment & Plan  Paroxysmal atrial fibrillation with RVR Asymptomatic, in the setting of COVID infection with generalized weakness,  low-grade fever - Unable to exclude longstanding paroxysmal atrial fibrillation as he is asymptomatic -Continued paroxysmal episodes despite amiodarone infusion -Will again try to increase metoprolol succinate up to 50 twice daily from 25 twice daily Appears he has been changed from heparin to Eliquis  - No plan from interventional cardiology to perform cardiac catheterization this admission despite elevated troponin   Non-STEMI Troponin 800, repeat 1000 new left bundle branch block, depressed ejection fraction 35% Will need outpatient ischemic workup after COVID resolves   COVID-19 infection Presenting with weakness, sore throat,  low-grade fever, general malaise, anorexia, cardiomyopathy On antivirals -Some chest congestion on exam today, no respiratory distress   Diabetes type 2 A1c 5.7 Managed by medicine team   Severe aortic valve stenosis Managed by Dr. Verlin in [place] Recent echocardiogram detailing severe AS - Will need outpatient cardiac catheterization given depressed ejection fraction, new left bundle branch block, non-STEMI this admission in the setting of COVID   For questions or updates, please contact [facility] Please consult [email or web address] for contact info under     Signed, Fedor Kazmierski, MD  07/08/2024, 1:19 PM

## 2024-07-09 DIAGNOSIS — I214 Non-ST elevation (NSTEMI) myocardial infarction: Secondary | ICD-10-CM | POA: Diagnosis not present

## 2024-07-09 DIAGNOSIS — U071 COVID-19: Secondary | ICD-10-CM | POA: Diagnosis not present

## 2024-07-09 DIAGNOSIS — R051 Acute cough: Secondary | ICD-10-CM | POA: Diagnosis not present

## 2024-07-09 DIAGNOSIS — A419 Sepsis, unspecified organism: Secondary | ICD-10-CM | POA: Diagnosis not present

## 2024-07-09 DIAGNOSIS — R0682 Tachypnea, not elsewhere classified: Secondary | ICD-10-CM | POA: Diagnosis not present

## 2024-07-09 LAB — BASIC METABOLIC PANEL WITH GFR
Anion gap: 11 (ref 5–15)
BUN: 22 mg/dL (ref 8–23)
CO2: 23 mmol/L (ref 22–32)
Calcium: 8 mg/dL — ABNORMAL LOW (ref 8.9–10.3)
Chloride: 103 mmol/L (ref 98–111)
Creatinine, Ser: 0.86 mg/dL (ref 0.61–1.24)
GFR, Estimated: >60 mL/min (ref >60)
Glucose, Bld: 138 mg/dL — ABNORMAL HIGH (ref 70–99)
Potassium: 3.9 mmol/L (ref 3.5–5.1)
Sodium: 137 mmol/L (ref 135–145)

## 2024-07-09 LAB — CBC
HCT: 35.3 % — ABNORMAL LOW (ref 39.0–52.0)
Hemoglobin: 12.1 g/dL — ABNORMAL LOW (ref 13.0–17.0)
MCH: 33.8 pg (ref 26.0–34.0)
MCHC: 34.3 g/dL (ref 30.0–36.0)
MCV: 98.6 fL (ref 80.0–100.0)
Platelets: 160 K/uL (ref 150–400)
RBC: 3.58 MIL/uL — ABNORMAL LOW (ref 4.22–5.81)
RDW: 11.7 % (ref 11.5–15.5)
WBC: 9.4 K/uL (ref 4.0–10.5)
nRBC: 0 % (ref 0.0–0.2)

## 2024-07-09 LAB — GLUCOSE, CAPILLARY
Glucose-Capillary: 148 mg/dL — ABNORMAL HIGH (ref 70–99)
Glucose-Capillary: 247 mg/dL — ABNORMAL HIGH (ref 70–99)
Glucose-Capillary: 127 mg/dL — ABNORMAL HIGH (ref 70–99)
Glucose-Capillary: 128 mg/dL — ABNORMAL HIGH (ref 70–99)

## 2024-07-09 MED ORDER — AMIODARONE HCL 200 MG PO TABS
400.0000 mg | ORAL_TABLET | Freq: Two times a day (BID) | ORAL | Status: DC
  Administered 2024-07-09 – 2024-07-11 (×5): 400 mg via ORAL
  Filled 2024-07-09 (×5): qty 2

## 2024-07-09 NOTE — Progress Notes (Signed)
 Occupational Therapy Treatment Patient Details Name: Donald [name] MRN: [id number] DOB: 07/05/1946 Today's Date: 07/09/2024   History of present illness Pt is a 78 y/o M admitted on 07/03/24 after presenting with acute onset of generalized weakness. Pt tested + for COVID. Pt also being treated for a-fib with RVR, NSTEMI. PMH: DM2, dyslipidemia, RA, aortic stenosis, dementia   OT comments  Pt is supine in bed on arrival. Pleasant and agreeable to OT/PT co-treatment session to maximize pt/therapist safety. He denies pain. Pt performed bed mobility with Min A, verb cues for sequencing. Pt required Min A X2 for STS from EOB and to ambulate to the bathroom. Min A x1 with use of grab bar to stand from toilet and Max A for standing peri-care. Pt placed on RA for session with desat to 85-87%, required replacement of 2L to maintain >90% sp02 readings. Mod AX 1 via HHA and forward, flexed posture noted during ambulation. Pt returned to recliner with all needs in place and will cont to require skilled acute OT services to maximize his safety and IND to return to PLOF.       If plan is discharge home, recommend the following:  Help with stairs or ramp for entrance;Assistance with cooking/housework;Assist for transportation;A lot of help with bathing/dressing/bathroom;A little help with walking and/or transfers   Equipment Recommendations  BSC/3in1;Other (comment) (RW)    Recommendations for Other Services      Precautions / Restrictions Precautions Precautions: Fall Recall of Precautions/Restrictions: Impaired Restrictions Weight Bearing Restrictions Per Provider Order: No       Mobility Bed Mobility Overal bed mobility: Needs Assistance Bed Mobility: Supine to Sit     Supine to sit: Min assist, HOB elevated, Used rails     General bed mobility comments: trunkal elevation assist, moving BLEs well    Transfers Overall transfer level: Needs assistance Equipment used: 2 person hand held  assist Transfers: Sit to/from Stand Sit to Stand: Min assist, +2 safety/equipment           General transfer comment: cues for hand placement, noted with forward flexed posture via HHA x1-2 with Min A x2 and Mod A x1, small shuffle steps which is baseline     Balance Overall balance assessment: Needs assistance Sitting-balance support: Feet supported, No upper extremity supported Sitting balance-Leahy Scale: Fair     Standing balance support: Bilateral upper extremity supported, During functional activity Standing balance-Leahy Scale: Fair Standing balance comment: HHA x2/Min A x2 for all standing activities                           ADL either performed or assessed with clinical judgement   ADL Overall ADL's : Needs assistance/impaired                         Toilet Transfer: Rolling walker (2 wheels);Regular Toilet;Minimal assistance;Ambulation;Grab bars   Toileting- Clothing Manipulation and Hygiene: Sit to/from stand;Maximal assistance Toileting - Clothing Manipulation Details (indicate cue type and reason): standing peri-care after cont BM on toilet     Functional mobility during ADLs: Minimal assistance;+2 for physical assistance General ADL Comments: Min A x2/HHA x2 to ambulate to the bathroom for cont BM on toilet, Max A for standing peri-care with grab bar use    Extremity/Trunk Assessment              Vision       Perception  Praxis     Communication Communication Communication: Impaired Factors Affecting Communication: Reduced clarity of speech   Cognition Arousal: Alert Behavior During Therapy: WFL for tasks assessed/performed               OT - Cognition Comments: mild confusion noted at times                 Following commands: Impaired Following commands impaired: Follows one step commands with increased time, Follows multi-step commands inconsistently      Cueing   Cueing Techniques: Verbal cues,  Tactile cues, Visual cues  Exercises      Shoulder Instructions       General Comments on 2L at rest with stable sp02, placed on RA for session and dropped to 85-87% at lowest, placed back on 2L d/t low readings at rest and noted in 90s at rest on 2L    Pertinent Vitals/ Pain       Pain Assessment Pain Assessment: No/denies pain  Home Living                                          Prior Functioning/Environment              Frequency  Min 2X/week        Progress Toward Goals  OT Goals(current goals can now be found in the care plan section)  Progress towards OT goals: Progressing toward goals  Acute Rehab OT Goals Patient Stated Goal: improve strength OT Goal Formulation: With patient/family Time For Goal Achievement: 07/20/24 Potential to Achieve Goals: Good  Plan      Co-evaluation    PT/OT/SLP Co-Evaluation/Treatment: Yes Reason for Co-Treatment: For patient/therapist safety;Necessary to address cognition/behavior during functional activity PT goals addressed during session: Mobility/safety with mobility OT goals addressed during session: ADL's and self-care      AM-PAC OT 6 Clicks Daily Activity     Outcome Measure   Help from another person eating meals?: A Little Help from another person taking care of personal grooming?: A Little Help from another person toileting, which includes using toliet, bedpan, or urinal?: A Lot Help from another person bathing (including washing, rinsing, drying)?: A Little Help from another person to put on and taking off regular upper body clothing?: A Little Help from another person to put on and taking off regular lower body clothing?: A Lot 6 Click Score: 16    End of Session Equipment Utilized During Treatment: Gait belt;Rolling walker (2 wheels)  OT Visit Diagnosis: Other abnormalities of gait and mobility (R26.89);Muscle weakness (generalized) (M62.81)   Activity Tolerance Patient tolerated  treatment well   Patient Left with call bell/phone within reach;in chair;with chair alarm set;with family/visitor present   Nurse Communication Mobility status        Time: 9052-8968 OT Time Calculation (min): 44 min  Charges: OT General Charges $OT Visit: 1 Visit OT Treatments $Self Care/Home Management : 23-37 mins  Collette Pescador, OTR/L  07/09/24, 2:58 PM   Winslow Ederer E Zaydenn Balaguer 07/09/2024, 2:56 PM

## 2024-07-09 NOTE — Progress Notes (Signed)
 Physical Therapy Treatment Patient Details Name: Donald [name] MRN: [id number] DOB: 09/27/1945 Today's Date: 07/09/2024   History of Present Illness Pt is a 78 y/o M admitted on 07/03/24 after presenting with acute onset of generalized weakness. Pt tested + for COVID. Pt also being treated for a-fib with RVR, NSTEMI. PMH: DM2, dyslipidemia, RA, aortic stenosis, dementia    PT Comments  Co tx with OT this session for safety d/t dec cognition and need for inc assist in previous therapy session. Pt demos improved cognition today compared to yesterday and is able to amb with min A x 2 for HHA. Pt required assistance to bathroom and able to maintain standing balance with assistance from OT for pericare. Pt on 2L of O2 upon entry with SpO2 WNL. Attempt to wean pt to RA made with desat to 87% at the lowest while amb. Attempt to amb with 1 person HHA with mod A x 1, however demonstrated inc forward flexion. Pt continues to amb with short step length and shuffled steps.  Once pt seated back in recliner, pt desats to 87%-88%. Pt placed on 2L via [place] to recover to >90%.    If plan is discharge home, recommend the following: A lot of help with walking and/or transfers;A little help with bathing/dressing/bathroom;Assist for transportation;Supervision due to cognitive status   Can travel by private vehicle     No  Equipment Recommendations  Other (comment) (TBD at next venue)    Recommendations for Other Services       Precautions / Restrictions Precautions Precautions: Fall Recall of Precautions/Restrictions: Impaired Restrictions Weight Bearing Restrictions Per Provider Order: No     Mobility  Bed Mobility Overal bed mobility: Needs Assistance Bed Mobility: Supine to Sit     Supine to sit: Min assist     General bed mobility comments: Min A for trunk elevation, however pt able to mobilize LEs without cuing or physical assist    Transfers Overall transfer level: Needs  assistance Equipment used: 2 person hand held assist Transfers: Sit to/from Stand Sit to Stand: Min assist, +2 safety/equipment           General transfer comment: Verbal cuing for hand placement on bed, posture, and to promote forward weight shift. Pt demos posterior bias however is able to correct with cuing. 2 person HHA for safety.    Ambulation/Gait Ambulation/Gait assistance: Min assist, +2 safety/equipment, Mod assist Gait Distance (Feet):  (93ft x 2) Assistive device: 2 person hand held assist, 1 person hand held assist Gait Pattern/deviations: Decreased step length - right, Decreased step length - left, Shuffle, Narrow base of support Gait velocity: decreased     General Gait Details: min A x2 with HHA with first bout to bathroom, mod A x 1 HHA to chair. Pt demos forward flexed posture and requires assistance to avoid LOB. Pt able to correct posture intermittently with cuing. Pt's wife states that pt is forward flexed while amb at baseline, however this was not observed by this SPT in previous sessions.   Stairs             Wheelchair Mobility     Tilt Bed    Modified Rankin (Stroke Patients Only)       Balance Overall balance assessment: Needs assistance Sitting-balance support: Feet supported, No upper extremity supported Sitting balance-Leahy Scale: Fair Sitting balance - Comments: Able to maintain sitting balance at EOB with CGA-SBA for safety.   Standing balance support: Bilateral upper extremity supported, During functional  activity Standing balance-Leahy Scale: Fair Standing balance comment: 2 person HHA upon standing at EOB. Pt demos better standing posture compared to with 1 person HHA. No LOB and min A x 2 for improved standing balance/posture. Pt able to maintain stand at toilet with SUE support from bathroom grab bar while OT assists with pericare.                            Communication Communication Communication:  Impaired Factors Affecting Communication: Reduced clarity of speech  Cognition Arousal: Alert Behavior During Therapy: WFL for tasks assessed/performed   PT - Cognitive impairments: History of cognitive impairments                       PT - Cognition Comments: Pt alert today compared to yesterday and more conversational. Agreeable to PT sessoin. Following commands: Impaired Following commands impaired: Follows one step commands with increased time, Follows multi-step commands inconsistently    Cueing Cueing Techniques: Verbal cues, Tactile cues, Visual cues  Exercises Other Exercises Other Exercises: Assistance to bathroom. HHA x 2 for safety.    General Comments General comments (skin integrity, edema, etc.): Pt on 2L of O2 upon entry. SpO2 WNL while on RA down to 87% while amb. Once seated back in chair and eating, pt desats and unable to recover despite cuing for PLB. SpO2 up to 2L at rest to recover to >90%. HR in 80s-90s during session.      Pertinent Vitals/Pain Pain Assessment Pain Assessment: No/denies pain    Home Living                          Prior Function            PT Goals (current goals can now be found in the care plan section) Acute Rehab PT Goals PT Goal Formulation: With patient Time For Goal Achievement: 07/20/24 Potential to Achieve Goals: Good Progress towards PT goals: Progressing toward goals    Frequency    Min 3X/week      PT Plan      Co-evaluation PT/OT/SLP Co-Evaluation/Treatment: Yes Reason for Co-Treatment: For patient/therapist safety;Necessary to address cognition/behavior during functional activity PT goals addressed during session: Mobility/safety with mobility OT goals addressed during session: ADL's and self-care      AM-PAC PT 6 Clicks Mobility   Outcome Measure  Help needed turning from your back to your side while in a flat bed without using bedrails?: A Little Help needed moving from lying on  your back to sitting on the side of a flat bed without using bedrails?: A Lot Help needed moving to and from a bed to a chair (including a wheelchair)?: A Lot Help needed standing up from a chair using your arms (e.g., wheelchair or bedside chair)?: A Little Help needed to walk in hospital room?: A Lot Help needed climbing 3-5 steps with a railing? : A Lot 6 Click Score: 14    End of Session Equipment Utilized During Treatment: Gait belt;Oxygen Activity Tolerance: Patient tolerated treatment well Patient left: in chair;with call bell/phone within reach;with chair alarm set;with family/visitor present Nurse Communication: Mobility status PT Visit Diagnosis: Unsteadiness on feet (R26.81);Muscle weakness (generalized) (M62.81);Difficulty in walking, not elsewhere classified (R26.2)     Time: 9062-8969 PT Time Calculation (min) (ACUTE ONLY): 53 min  Charges:  Megean Fabio, SPT    Beola Vasallo 07/09/2024, 11:41 AM

## 2024-07-09 NOTE — TOC Progression Note (Signed)
 Transition of Care [facility]) - Progression Note    Patient Details  Name: Donald [name] MRN: [id number] Date of Birth: [birth year]  [facility] CM/SW Contact  [name], LCSW Phone Number: 07/09/2024, 10:10 AM  Clinical Narrative:   Left voicemail for wife. Will discuss SNF recommendation when she calls back.  Expected Discharge Plan: Home/Self Care Barriers to Discharge: Continued Medical Work up               Expected Discharge Plan and Services     Post Acute Care Choice: NA Living arrangements for the past 2 months: Single Family Home                                       Social Drivers of Health (SDOH) Interventions SDOH Screenings   Food Insecurity: No Food Insecurity (07/04/2024)  Housing: Low Risk  (07/04/2024)  Transportation Needs: No Transportation Needs (07/04/2024)  Utilities: Not At Risk (07/04/2024)  Alcohol Screen: Low Risk  (08/20/2023)  Depression (PHQ2-9): Low Risk  (08/20/2023)  Financial Resource Strain: Low Risk  (12/19/2023)   Received from [facility]  Physical Activity: Inactive (08/20/2023)  Social Connections: Moderately Integrated (07/04/2024)  Stress: No Stress Concern Present (08/20/2023)  Tobacco Use: Medium Risk (07/03/2024)  Health Literacy: Adequate Health Literacy (08/20/2023)    Readmission Risk Interventions     No data to display

## 2024-07-09 NOTE — Plan of Care (Signed)

## 2024-07-09 NOTE — Progress Notes (Signed)
  Progress Note   Patient: Donald [name] FMW:[id number] DOB: 06/29/1946 DOA: 07/03/2024     5 DOS: the patient was seen and examined on 07/09/2024   Brief hospital course: 78 y.o. Caucasian male with medical history significant for type 2 diabetes mellitus, dyslipidemia, rheumatoid arthritis and aortic stenosis, presented with acute onset of generalized weakness.  Patient admitted for sepsis due to COVID infection.  Hospital course complicated by A-fib with RVR, seen by cardiology. Hospital course as below   Assessment and Plan: Atrial fibrillation with rapid ventricular response - Suspect paroxysmal in nature with COVID infection. Patient asymptomatic with respect to fast heart beat  - On metoprolol succinate 50 mg twice daily, Eliquis - stop IV Amiodarone, start amiodarone 400mg  bid - Cardiology following, appreciate recs   Non-STEMI Troponin peaked at 1099 EKG with new left bundle branch block, now with depressed ejection fraction 35% Outpatient ischemic workup per cardiolgoy  Symptomatic COVID infection with Hypoxia Fever 102.4 and hypoxia (needing 4 liter O2 via n.c.) on 10/26-chest x-ray on 10/26 concerning for pneumonia Treated with 3 days of IV remdesevir On IV Zosyn He's not vaccinated for covid - confirmed by family per nursing Now family also has covid +  Acute encephalopathy - improved - Suspect secondary to pneumonia, underlying dementia, hospital acquired delirium - Delirium precautions  Dyslipidemia On Crestor   Rheumatoid arthritis (HCC) Hold Arava due to ongoing infection concerns  Controlled type 2 diabetes mellitus without complication, without long-term current use of insulin (HCC) Hemoglobin A1c low at 5.7.   continue sliding scale insulin  Dementia without behavioral disturbance (HCC) On Exelon  Vitamin B12 deficiency Continue vitamin B12  Severe aortic stenosis - Follow-up with primary cardiology in [place], for possible TAVR work  up  GOC Palliative care seen   Subjective: Patient more awake and alert today.  Wife present at the bedside Continues to have mild tachypnea, will continue IV Zosyn Denies any complaints today  Physical Exam: Vitals:   07/09/24 0515 07/09/24 0813 07/09/24 1204 07/09/24 1600  BP: 124/63 (!) 151/82 126/64 132/69  Pulse: 83 78 71 65  Resp: 20 20 18 16   Temp:  98.8 F (37.1 C) 98.4 F (36.9 C) 98 F (36.7 C)  TempSrc:      SpO2: 95% 97% 97% 99%  Weight:      Height:       Physical Exam Cardiovascular:     Rate and Rhythm: Rhythm irregularly irregular.     Heart sounds: Normal heart sounds, S1 normal and S2 normal.  Pulmonary:     Breath sounds: No decreased breath sounds, wheezing or rales.  Abdominal:     Palpations: Abdomen is soft.     Tenderness: There is no abdominal tenderness.  Musculoskeletal:     Right lower leg: No swelling.     Left lower leg: No swelling.  Skin:    General: Skin is warm.     Findings: No rash.  Neurological:     Mental Status: Awake, alert, confused    Family Communication: Wife at bedside  Disposition: Status is: Inpatient On IV Antibiotics  Planned Discharge Destination: Home with Home Health vs SNF    Time spent: 55 minutes  Author: Laree Lock, MD 07/09/2024 4:17 PM  For on call review [email or web address].

## 2024-07-09 NOTE — Progress Notes (Signed)
 Rounding Note   Patient Name: Donald [name] Date of Encounter: 07/09/2024  [facility] Cardiologist: Redell Cave, MD   Subjective Resting comfortably this morning, not very hungry Overall no complaints, reports that he feels well Yesterday metoprolol succinate increased up to 50 twice daily given frequent paroxysmal atrial fibrillation episodes Remains on amiodarone infusion 0.5 mg/min, tolerating metoprolol succinate 50 twice daily, blood pressure stable On Eliquis, hemoglobin stable   Scheduled Meds:  amiodarone  400 mg Oral BID   apixaban  5 mg Oral BID   cyanocobalamin   1,000 mcg Oral Daily   insulin aspart  0-15 Units Subcutaneous TID WC   insulin aspart  0-5 Units Subcutaneous QHS   loratadine  10 mg Oral Daily   magnesium oxide  400 mg Oral Daily   metoprolol succinate  50 mg Oral BID   multivitamin with minerals  1 tablet Oral Daily   rivastigmine  1.5 mg Oral BID   rosuvastatin   10 mg Oral Daily   Continuous Infusions:  piperacillin-tazobactam (ZOSYN)  IV 3.375 g (07/09/24 0628)   PRN Meds: acetaminophen  **OR** acetaminophen , guaiFENesin-dextromethorphan, magnesium hydroxide, ondansetron **OR** ondansetron (ZOFRAN) IV, traZODone   Vital Signs  Vitals:   07/09/24 0041 07/09/24 0408 07/09/24 0515 07/09/24 0813  BP: 134/74 (!) 142/76 124/63 (!) 151/82  Pulse: 81 79 83 78  Resp: (!) 22 (!) 26 20 20   Temp: 98.7 F (37.1 C) 98.9 F (37.2 C)  98.8 F (37.1 C)  TempSrc:      SpO2: 90% 90% 95% 97%  Weight:      Height:        Intake/Output Summary (Last 24 hours) at 07/09/2024 1045 Last data filed at 07/09/2024 9371 Gross per 24 hour  Intake 552.34 ml  Output 500 ml  Net 52.34 ml      07/03/2024    4:13 PM 06/08/2024   10:45 AM 03/12/2024    2:51 PM  Last 3 Weights  Weight (lbs) 170 lb 168 lb 3.2 oz 166 lb  Weight (kg) 77.111 kg 76.295 kg 75.297 kg      Telemetry Normal sinus rhythm minimal paroxysmal atrial fibrillation- Personally  Reviewed  ECG   - Personally Reviewed  Physical Exam  Constitutional:  oriented to person, place, and time. No distress.  HENT:  Head: Grossly normal Eyes:  no discharge. No scleral icterus.  Neck: No JVD, no carotid bruits  Cardiovascular: Regular rate and rhythm, no murmurs appreciated Pulmonary/Chest: Clear to auscultation bilaterally, no wheezes or rails Abdominal: Soft.  no distension.  no tenderness.  Musculoskeletal: Normal range of motion Neurological:  normal muscle tone. Coordination normal. No atrophy Skin: Skin warm and dry Psychiatric: normal affect, pleasant   Labs High Sensitivity Troponin:   Recent Labs  Lab 07/04/24 1227 07/04/24 1516  TROPONINIHS 855* 1,099*     Chemistry Recent Labs  Lab 07/04/24 1227 07/06/24 0420 07/07/24 0526 07/08/24 0533 07/09/24 0438  NA  --  132* 134* 136 137  K  --  3.8 4.0 3.5 3.9  CL  --  99 99 102 103  CO2  --  23 24 23 23   GLUCOSE  --  121* 121* 144* 138*  BUN  --  22 30* 27* 22  CREATININE  --  1.03 0.93 0.96 0.86  CALCIUM   --  7.8* 7.9* 7.9* 8.0*  MG 2.0  --   --   --   --   PROT  --  5.5*  --   --   --  ALBUMIN  --  2.7*  --   --   --   AST  --  40  --   --   --   ALT  --  18  --   --   --   ALKPHOS  --  28*  --   --   --   BILITOT  --  0.6  --   --   --   GFRNONAA  --  >60 >60 >60 >60  ANIONGAP  --  10 11 11 11     Lipids No results for input(s): CHOL, TRIG, HDL, LABVLDL, LDLCALC, CHOLHDL in the last 168 hours.  Hematology Recent Labs  Lab 07/07/24 0526 07/08/24 0533 07/09/24 0438  WBC 5.8 8.9 9.4  RBC 3.28* 3.40* 3.58*  HGB 11.1* 11.8* 12.1*  HCT 32.0* 33.1* 35.3*  MCV 97.6 97.4 98.6  MCH 33.8 34.7* 33.8  MCHC 34.7 35.6 34.3  RDW 11.9 11.8 11.7  PLT 109* 125* 160   Thyroid   Recent Labs  Lab 07/04/24 1227  TSH 0.217*    BNPNo results for input(s): BNP, PROBNP in the last 168 hours.  DDimer No results for input(s): DDIMER in the last 168 hours.   Radiology  DG Chest 1  View Result Date: 07/08/2024 EXAM: 1 VIEW XRAY OF THE CHEST 07/08/2024 05:22:26 PM COMPARISON: Chest x-ray 07/05/2024. CLINICAL HISTORY: 872214 Pneumonia 872214. Pneumonia Pneumonia FINDINGS: LUNGS AND PLEURA: There are patchy and streaky perihilar opacities bilaterally similar to prior. There is a small left pleural effusion which is unchanged. No pulmonary edema. No pneumothorax. HEART AND MEDIASTINUM: The heart is mildly enlarged. BONES AND SOFT TISSUES: No acute osseous abnormality. IMPRESSION: 1. Patchy and streaky perihilar opacities bilaterally, similar to prior, consistent with pneumonia. 2. Small left pleural effusion, unchanged. Electronically signed by: Greig Pique MD 07/08/2024 07:49 PM EDT RP Workstation: [id number]    Cardiac Studies    Patient Profile   Donald [name] is a 78 year old gentleman with severe aortic valve stenosis, diabetes, hyperlipidemia, dementia, RA presenting to the hospital with weakness diagnosed with COVID, paroxysmal atrial fibrillation with RVR noted on day 2 of admission    Assessment & Plan  Paroxysmal atrial fibrillation with RVR Asymptomatic, in the setting of COVID infection with generalized weakness,  low-grade fever - Unable to exclude longstanding paroxysmal atrial fibrillation as he is asymptomatic -Past 24 hours with minimal atrial fibrillation -Will recommend we discontinue amiodarone infusion, initiate amiodarone 400 twice daily with metoprolol succinate 50 twice daily - Continue Eliquis   Non-STEMI Troponin 800, repeat 1000 new left bundle branch block, depressed ejection fraction 35% -Plan for outpatient ischemic workup  COVID-19 infection Presenting with weakness, sore throat,  low-grade fever, general malaise, anorexia, cardiomyopathy On antivirals - Chest congestion appreciated yesterday on exam is moving, more alert, mentating better   Diabetes type 2 A1c 5.7 Managed by medicine team   Severe aortic valve stenosis Managed by  Dr. Verlin in [place] History of severe AS - Will defer to primary cardiology in [place], consideration of cardiac catheterization given depressed ejection fraction, new left bundle branch block, non-STEMI this admission in the setting of COVID, possible TAVR workup  Dementia No family at the bedside to discuss   For questions or updates, please contact [facility] Please consult [email or web address] for contact info under     Signed, Yelina Sarratt, MD  07/09/2024, 10:45 AM

## 2024-07-10 ENCOUNTER — Inpatient Hospital Stay

## 2024-07-10 DIAGNOSIS — Z515 Encounter for palliative care: Secondary | ICD-10-CM | POA: Diagnosis not present

## 2024-07-10 DIAGNOSIS — I4891 Unspecified atrial fibrillation: Secondary | ICD-10-CM | POA: Diagnosis not present

## 2024-07-10 DIAGNOSIS — R0682 Tachypnea, not elsewhere classified: Secondary | ICD-10-CM | POA: Diagnosis not present

## 2024-07-10 DIAGNOSIS — U071 COVID-19: Secondary | ICD-10-CM | POA: Diagnosis not present

## 2024-07-10 DIAGNOSIS — A419 Sepsis, unspecified organism: Secondary | ICD-10-CM | POA: Diagnosis not present

## 2024-07-10 DIAGNOSIS — R531 Weakness: Secondary | ICD-10-CM | POA: Diagnosis not present

## 2024-07-10 LAB — CULTURE, BLOOD (ROUTINE X 2)
Culture: NO GROWTH
Culture: NO GROWTH
Special Requests: ADEQUATE
Special Requests: ADEQUATE

## 2024-07-10 LAB — BASIC METABOLIC PANEL WITH GFR
Anion gap: 8 (ref 5–15)
BUN: 24 mg/dL — ABNORMAL HIGH (ref 8–23)
CO2: 25 mmol/L (ref 22–32)
Calcium: 8 mg/dL — ABNORMAL LOW (ref 8.9–10.3)
Chloride: 101 mmol/L (ref 98–111)
Creatinine, Ser: 0.87 mg/dL (ref 0.61–1.24)
GFR, Estimated: >60 mL/min (ref >60)
Glucose, Bld: 138 mg/dL — ABNORMAL HIGH (ref 70–99)
Potassium: 4.2 mmol/L (ref 3.5–5.1)
Sodium: 134 mmol/L — ABNORMAL LOW (ref 135–145)

## 2024-07-10 LAB — GLUCOSE, CAPILLARY
Glucose-Capillary: 115 mg/dL — ABNORMAL HIGH (ref 70–99)
Glucose-Capillary: 180 mg/dL — ABNORMAL HIGH (ref 70–99)
Glucose-Capillary: 111 mg/dL — ABNORMAL HIGH (ref 70–99)
Glucose-Capillary: 139 mg/dL — ABNORMAL HIGH (ref 70–99)

## 2024-07-10 LAB — CBC
HCT: 33 % — ABNORMAL LOW (ref 39.0–52.0)
Hemoglobin: 11.7 g/dL — ABNORMAL LOW (ref 13.0–17.0)
MCH: 34.2 pg — ABNORMAL HIGH (ref 26.0–34.0)
MCHC: 35.5 g/dL (ref 30.0–36.0)
MCV: 96.5 fL (ref 80.0–100.0)
Platelets: 184 K/uL (ref 150–400)
RBC: 3.42 MIL/uL — ABNORMAL LOW (ref 4.22–5.81)
RDW: 11.7 % (ref 11.5–15.5)
WBC: 9.5 K/uL (ref 4.0–10.5)
nRBC: 0 % (ref 0.0–0.2)

## 2024-07-10 MED ORDER — PIPERACILLIN-TAZOBACTAM 3.375 G IVPB
3.3750 g | Freq: Three times a day (TID) | INTRAVENOUS | Status: AC
  Administered 2024-07-10 – 2024-07-11 (×5): 3.375 g via INTRAVENOUS
  Filled 2024-07-10 (×5): qty 50

## 2024-07-10 NOTE — Progress Notes (Signed)
 Rounding Note   Patient Name: Donald [name] Date of Encounter: 07/10/2024  [facility] Cardiologist: Redell Cave, MD   Subjective Patient seen on a.m. rounds.  Overall no complaints.  Maintaining sinus rhythm on telemetry.  Scheduled Meds:  amiodarone  400 mg Oral BID   apixaban  5 mg Oral BID   cyanocobalamin   1,000 mcg Oral Daily   insulin aspart  0-15 Units Subcutaneous TID WC   insulin aspart  0-5 Units Subcutaneous QHS   loratadine  10 mg Oral Daily   magnesium oxide  400 mg Oral Daily   metoprolol succinate  50 mg Oral BID   multivitamin with minerals  1 tablet Oral Daily   rivastigmine  1.5 mg Oral BID   rosuvastatin   10 mg Oral Daily   Continuous Infusions:  piperacillin-tazobactam (ZOSYN)  IV     PRN Meds: acetaminophen  **OR** acetaminophen , guaiFENesin-dextromethorphan, magnesium hydroxide, ondansetron **OR** ondansetron (ZOFRAN) IV, traZODone   Vital Signs  Vitals:   07/09/24 2010 07/10/24 0013 07/10/24 0334 07/10/24 0731  BP: 126/68 129/73 133/76 137/77  Pulse: 69 70 71 75  Resp: 20 (!) 23 (!) 24   Temp: 98.4 F (36.9 C) 97.9 F (36.6 C) 98.4 F (36.9 C) 98 F (36.7 C)  TempSrc:    Oral  SpO2: 97% 97% 92% 94%  Weight:      Height:        Intake/Output Summary (Last 24 hours) at 07/10/2024 1045 Last data filed at 07/10/2024 0336 Gross per 24 hour  Intake --  Output 600 ml  Net -600 ml      07/03/2024    4:13 PM 06/08/2024   10:45 AM 03/12/2024    2:51 PM  Last 3 Weights  Weight (lbs) 170 lb 168 lb 3.2 oz 166 lb  Weight (kg) 77.111 kg 76.295 kg 75.297 kg      Telemetry Sinus rhythm- Personally Reviewed  ECG  No new tracings- Personally Reviewed  Physical Exam  GEN: No acute distress.   Neck: No JVD Cardiac: RRR, no murmurs, rubs, or gallops.  Respiratory: Clear diminished bases to auscultation bilaterally.  Transitioned off of oxygen to room air this morning GI: Soft, nontender, non-distended  MS: No edema; No  deformity. Neuro:  Nonfocal  Psych: Normal affect   Labs High Sensitivity Troponin:   Recent Labs  Lab 07/04/24 1227 07/04/24 1516  TROPONINIHS 855* 1,099*     Chemistry Recent Labs  Lab 07/04/24 1227 07/06/24 0420 07/07/24 0526 07/08/24 0533 07/09/24 0438 07/10/24 0323  NA  --  132*   < > 136 137 134*  K  --  3.8   < > 3.5 3.9 4.2  CL  --  99   < > 102 103 101  CO2  --  23   < > 23 23 25   GLUCOSE  --  121*   < > 144* 138* 138*  BUN  --  22   < > 27* 22 24*  CREATININE  --  1.03   < > 0.96 0.86 0.87  CALCIUM   --  7.8*   < > 7.9* 8.0* 8.0*  MG 2.0  --   --   --   --   --   PROT  --  5.5*  --   --   --   --   ALBUMIN  --  2.7*  --   --   --   --   AST  --  40  --   --   --   --  ALT  --  18  --   --   --   --   ALKPHOS  --  28*  --   --   --   --   BILITOT  --  0.6  --   --   --   --   GFRNONAA  --  >60   < > >60 >60 >60  ANIONGAP  --  10   < > 11 11 8    < > = values in this interval not displayed.    Lipids No results for input(s): CHOL, TRIG, HDL, LABVLDL, LDLCALC, CHOLHDL in the last 168 hours.  Hematology Recent Labs  Lab 07/08/24 0533 07/09/24 0438 07/10/24 0323  WBC 8.9 9.4 9.5  RBC 3.40* 3.58* 3.42*  HGB 11.8* 12.1* 11.7*  HCT 33.1* 35.3* 33.0*  MCV 97.4 98.6 96.5  MCH 34.7* 33.8 34.2*  MCHC 35.6 34.3 35.5  RDW 11.8 11.7 11.7  PLT 125* 160 184   Thyroid   Recent Labs  Lab 07/04/24 1227  TSH 0.217*    BNPNo results for input(s): BNP, PROBNP in the last 168 hours.  DDimer No results for input(s): DDIMER in the last 168 hours.   Radiology  DG Chest 1 View Result Date: 07/08/2024 EXAM: 1 VIEW XRAY OF THE CHEST 07/08/2024 05:22:26 PM COMPARISON: Chest x-ray 07/05/2024. CLINICAL HISTORY: 872214 Pneumonia [id number]. Pneumonia Pneumonia FINDINGS: LUNGS AND PLEURA: There are patchy and streaky perihilar opacities bilaterally similar to prior. There is a small left pleural effusion which is unchanged. No pulmonary edema. No pneumothorax.  HEART AND MEDIASTINUM: The heart is mildly enlarged. BONES AND SOFT TISSUES: No acute osseous abnormality. IMPRESSION: 1. Patchy and streaky perihilar opacities bilaterally, similar to prior, consistent with pneumonia. 2. Small left pleural effusion, unchanged. Electronically signed by: Greig Pique MD 07/08/2024 07:49 PM EDT RP Workstation: [id number]    Cardiac Studies Limited 2d echo 07/04/2024 1. Left ventricular ejection fraction, by estimation, is 35 to 40%. The  left ventricle has moderately decreased function. The left ventricle  demonstrates global hypokinesis. Left ventricular diastolic parameters are  consistent with Grade I diastolic  dysfunction (impaired relaxation).   2. Right ventricular systolic function is normal. The right ventricular  size is normal.   3. The mitral valve is normal in structure. Mild mitral valve  regurgitation. No evidence of mitral stenosis.   4. The aortic valve is calcified. Aortic valve regurgitation is not  visualized. Severe aortic valve stenosis by Aortic valve area, by VTI  measures 0.48 cm. Aortic valve mean gradient measures 24.0 mmHg and  Aortic valve Vmax measures 3.27 m/s, likely  underestimated in setting of cardiomyopathy.   5. The inferior vena cava is normal in size with greater than 50%  respiratory variability, suggesting right atrial pressure of 3 mmHg.   Patient Profile   78 y.o. male with past medical history of severe aortic valve stenosis, diabetes, hyperlipidemia, dementia, and RA who presented to the hospital with weakness diagnosed with COVID, paroxysmal atrial fibrillation with RVR who has been seen and evaluated for his A-fib RVR.  Assessment & Plan  Paroxysmal atrial fibrillation with RVR -Asymptomatic in the setting of COVID infection -Unable to exclude longstanding paroxysmal atrial fibrillation as he is asymptomatic -Continue on p.o. amiodarone 400 mg twice daily and metoprolol succinate 50 mg twice daily -Currently  maintaining sinus rhythm on telemetry -Continued on telemetry monitoring -Continue on apixaban 5 mg twice daily for stroke prophylaxis - NSTEMI -Troponin 800 and repeated at thousand -New  left bundle branch block noted on EKG -EF 35% -Plan for outpatient ischemic workup  HFrEF -Echo 10/31/2023 showed LVEF of 60 to 65%, mild MR, severe AS -Echo 9/25 showed LVEF 50-55%, mild MR, severe AS -Echo this admission showed LVEF 35-40%, G1 DD, mild MDD, severe AS - Cardiomyopathy etiology valve versus ischemic versus AAS -Patient continues to appear euvolemic -Continued on Toprol -Escalate GDMT as able -Daily weights and I's and O's  COVID-19 infection -Presenting with weakness, sore throat, low-grade fever, generalized malaise, anorexia - Continued on antivirals -Able to be weaned off of oxygen to room air -More alert and mentating better  Type 2 diabetes -Hemoglobin A1c 5.7 -Ongoing management per IM  Severe aortic valve stenosis -Managed by Dr. Verlin in [place] -Will defer to primary cardiology in [facility] for consideration of cardiac catheterization for possible TAVR workup (new left bundle branch block noted on EKG and NSTEMI this admission in the setting of COVID)  Dementia -Baseline -No family currently at baseline   For questions or updates, please contact [facility] Please consult [email or web address] for contact info under       Signed, Randen Kauth, NP  07/10/2024, 10:45 AM

## 2024-07-10 NOTE — Plan of Care (Signed)
  Problem: Education: Goal: Ability to describe self-care measures that may prevent or decrease complications (Diabetes Survival Skills Education) will improve Outcome: Progressing Goal: Individualized Educational Video(s) Outcome: Progressing   Problem: Coping: Goal: Ability to adjust to condition or change in health will improve Outcome: Progressing   Problem: Fluid Volume: Goal: Ability to maintain a balanced intake and output will improve Outcome: Progressing   Problem: Health Behavior/Discharge Planning: Goal: Ability to identify and utilize available resources and services will improve Outcome: Progressing Goal: Ability to manage health-related needs will improve Outcome: Progressing   Problem: Metabolic: Goal: Ability to maintain appropriate glucose levels will improve Outcome: Progressing   Problem: Nutritional: Goal: Maintenance of adequate nutrition will improve Outcome: Progressing Goal: Progress toward achieving an optimal weight will improve Outcome: Progressing   Problem: Skin Integrity: Goal: Risk for impaired skin integrity will decrease Outcome: Progressing   Problem: Tissue Perfusion: Goal: Adequacy of tissue perfusion will improve Outcome: Progressing   Problem: Education: Goal: Knowledge of General Education information will improve Description: Including pain rating scale, medication(s)/side effects and non-pharmacologic comfort measures Outcome: Progressing   Problem: Health Behavior/Discharge Planning: Goal: Ability to manage health-related needs will improve Outcome: Progressing   Problem: Clinical Measurements: Goal: Ability to maintain clinical measurements within normal limits will improve Outcome: Progressing Goal: Will remain free from infection Outcome: Progressing Goal: Diagnostic test results will improve Outcome: Progressing Goal: Respiratory complications will improve Outcome: Progressing Goal: Cardiovascular complication will  be avoided Outcome: Progressing   Problem: Activity: Goal: Risk for activity intolerance will decrease Outcome: Progressing   Problem: Nutrition: Goal: Adequate nutrition will be maintained Outcome: Progressing   Problem: Coping: Goal: Level of anxiety will decrease Outcome: Progressing   Problem: Elimination: Goal: Will not experience complications related to bowel motility Outcome: Progressing Goal: Will not experience complications related to urinary retention Outcome: Progressing   Problem: Pain Managment: Goal: General experience of comfort will improve and/or be controlled Outcome: Progressing   Problem: Safety: Goal: Ability to remain free from injury will improve Outcome: Progressing   Problem: Skin Integrity: Goal: Risk for impaired skin integrity will decrease Outcome: Progressing   Problem: Education: Goal: Knowledge of risk factors and measures for prevention of condition will improve Outcome: Progressing   Problem: Coping: Goal: Psychosocial and spiritual needs will be supported Outcome: Progressing   Problem: Respiratory: Goal: Will maintain a patent airway Outcome: Progressing Goal: Complications related to the disease process, condition or treatment will be avoided or minimized Outcome: Progressing

## 2024-07-10 NOTE — TOC Progression Note (Addendum)
 Transition of Care [facility]) - Progression Note    Patient Details  Name: Donald [name] MRN: [id number] Date of Birth: 02/14/1946  [facility] CM/SW Contact  Racheal LITTIE Schimke, RN Phone Number: 07/10/2024, 11:39 AM  Clinical Narrative: Spoke with patient's wife, [name] about PT recommendation for SNF, she was receptive however prefers to review list and make decision. CM will print list and take to patient's room or wife to review. Per [place], OT wife will take patient home and says she will be able to provide care.    Expected Discharge Plan: Home/Self Care Barriers to Discharge: Continued Medical Work up               Expected Discharge Plan and Services     Post Acute Care Choice: NA Living arrangements for the past 2 months: Single Family Home                                       Social Drivers of Health (SDOH) Interventions SDOH Screenings   Food Insecurity: No Food Insecurity (07/04/2024)  Housing: Low Risk  (07/04/2024)  Transportation Needs: No Transportation Needs (07/04/2024)  Utilities: Not At Risk (07/04/2024)  Alcohol Screen: Low Risk  (08/20/2023)  Depression (PHQ2-9): Low Risk  (08/20/2023)  Financial Resource Strain: Low Risk  (12/19/2023)   Received from [facility]  Physical Activity: Inactive (08/20/2023)  Social Connections: Moderately Integrated (07/04/2024)  Stress: No Stress Concern Present (08/20/2023)  Tobacco Use: Medium Risk (07/03/2024)  Health Literacy: Adequate Health Literacy (08/20/2023)    Readmission Risk Interventions     No data to display

## 2024-07-10 NOTE — Progress Notes (Signed)
 Daily Progress Note   Patient Name: Donald [name]       Date: 07/10/2024 DOB: Aug 26, 1946  Age: 78 y.o. MRN#: [id number] Attending Physician: Jerelene Critchley, MD Primary Care Physician: Glendia Shad, MD Admit Date: 07/03/2024  Reason for Consultation/Follow-up: Establishing goals of care  HPI/Brief Hospital Review: 78 y.o. male  with past medical history significant for DM II, dyslipidemia, aortic stenosis and RA. Patient presented to ED 07/03/2024 from home c/o acute onset generalized weakness, decreased PO intake, sore throat, cough and congestion.    Patient found to be Covid +. Labs significant for RBC 3.69, Hgb 12.5, CO2 21, Ca++ 8.7, lactic 2.3, UA with glucose, ketones and proteinuria, negative for infection. CXR with no acute cardiopulmonary process.   TRH was consulted for admission and management of sepsis d/t Covid 19.    10/25 Patient found to have Afib with RVR, HR >135 and new RBBB. Cardiology consulted. Has converted to NSR, started on amiodarone, metoprolol and apixaban. Plan for ischemic work up for NSTEMI. HFrEF (EF significantly declined on echo this admission 35-40% with severe AS)   Palliative care consulted for assistance with goals of care discussions.   Subjective: Extensive chart review has been completed prior to meeting patient including labs, vital signs, imaging, progress notes, orders, and available advanced directive documents from current and previous encounters.    Visited with [name] at his bedside.  He is awake, alert and able to engage in conversation.  Wife at bedside during time of visit.  [name] reports feeling well today without acute complaints.  He denies acute pain or discomfort and denies chest pain.  He reports no recent changes in  appetite, sleeping well at night.  Wife shares potential plan in the next 1 to 2 days for discharge.  Wife shares her concerns related to [name] being able to mobilize and ambulate at home and her ability to assist him with the amount of assistance that he needs.  [name] does discuss feeling quite weak as he has been hospitalized for 6 days with minimal movement in therapy.  He was able to tolerate sitting in the chair for several hours yesterday but required assistance with transfer from bed to chair.  We discussed short-term rehab and the benefits of having a focus on physical therapy.  We  discussed risks of returning home with profound weakness with high fall risk with further injury.  [name] with hesitations regarding discharge to short-term rehab but wife is hopeful to get him to a place for rehab will be the focus with an in goal of him returning home and functioning independently as he was prior to hospitalization.  Answered and addressed all questions and concerns.  PMT to continue to follow for ongoing needs and concerns.  Objective:  Physical Exam Constitutional:      General: He is not in acute distress.    Appearance: He is ill-appearing.  HENT:     Head: Normocephalic.     Mouth/Throat:     Mouth: Mucous membranes are dry.  Pulmonary:     Effort: Pulmonary effort is normal. No respiratory distress.  Skin:    General: Skin is warm and dry.  Neurological:     Mental Status: He is alert and oriented to person, place, and time.     Motor: Weakness present.  Psychiatric:        Mood and Affect: Mood normal.        Behavior: Behavior normal.        Thought Content: Thought content normal.             Vital Signs: BP 129/65 (BP Location: Right Arm)   Pulse 69   Temp 98.1 F (36.7 C)   Resp 16   Ht 5' 10 (1.778 m)   Wt 77.1 kg   SpO2 92%   BMI 24.39 kg/m  SpO2: SpO2: 92 % O2 Device: O2 Device: Room Air O2 Flow Rate: O2 Flow Rate (L/min): 2 L/min   Palliative  Care Assessment & Plan   Assessment/Recommendation/Plan  Continue with current plan of care Pending discharge home versus short-term rehab  Thank you for allowing the Palliative Medicine Team to assist in the care of this patient.  Visit includes: Detailed review of medical records (labs, imaging, vital signs), medically appropriate exam (mental status, respiratory, cardiac, skin), discussed with treatment team, counseling and educating patient, family and staff, documenting clinical information, medication management and coordination of care.  Waddell Lesches, DNP, AGNP-C Palliative Medicine   Please contact Palliative Medicine Team phone at [phone] for questions and concerns.

## 2024-07-10 NOTE — Progress Notes (Signed)
 Progress Note   Patient: Donald [name] FMW:[id number] DOB: 10/31/1945 DOA: 07/03/2024     6 DOS: the patient was seen and examined on 07/10/2024   Brief hospital course: 78 y.o. Caucasian male with medical history significant for type 2 diabetes mellitus, dyslipidemia, rheumatoid arthritis and aortic stenosis, presented with acute onset of generalized weakness.  Patient admitted for sepsis due to COVID infection.  Hospital course complicated by A-fib with RVR, seen by cardiology. Hospital course as below  Assessment and Plan: Atrial fibrillation with rapid ventricular response - Suspect paroxysmal in nature with COVID infection. Patient asymptomatic with respect to fast heart beat  - On metoprolol succinate 50 mg twice daily, Eliquis - on amiodarone 400mg  bid - Cardiology following, appreciate recs   Non-STEMI Troponin peaked at 1099 EKG with new left bundle branch block, now with depressed ejection fraction 35% Outpatient ischemic workup per cardiology  HFrEF Echo with EF 35 to 40%, G1 DD, severe AS Euvolemic on exam, on metoprolol  Symptomatic COVID infection with Hypoxia Fever 102.4 and hypoxia (needing 4 liter O2 via n.c.) on 10/26-chest x-ray on 10/26 concerning for pneumonia Treated with 3 days of IV remdesevir On IV Zosyn until 11/01 He's not vaccinated for covid - confirmed by family per nursing Now family also has covid +  Acute metabolic encephalopathy - resolved - Suspect secondary to pneumonia, underlying dementia, hospital acquired delirium - Delirium precautions  RUE pain - Has swelling and tenderness RUE - On Eliquis, US  r/o DVT  Dyslipidemia On Crestor   Rheumatoid arthritis (HCC) Hold Arava due to ongoing infection concerns  Controlled type 2 diabetes mellitus without complication, without long-term current use of insulin (HCC) Hemoglobin A1c low at 5.7.   continue sliding scale insulin  Dementia without behavioral disturbance (HCC) On  Exelon  Vitamin B12 deficiency Continue vitamin B12  Severe aortic stenosis - Follow-up with primary cardiology in [place], for possible TAVR work up  GOC Palliative care seen   Subjective: Patient more awake and alert today.  Wife present at the bedside Mental status improving, heart rate controlled. Has pain RUE, swelling and mild erythema -US  to rule out DVT Discussed with wife regarding discharge to home with home therapy vs SNF.  Wife states she will not be able to take care of him at home [facility] working on SNF placement  Physical Exam: Vitals:   07/10/24 0334 07/10/24 0731 07/10/24 1055 07/10/24 1500  BP: 133/76 137/77 129/65 122/72  Pulse: 71 75 69 70  Resp: (!) 24  16   Temp: 98.4 F (36.9 C) 98 F (36.7 C) 98.1 F (36.7 C) 98.2 F (36.8 C)  TempSrc:  Oral  Oral  SpO2: 92% 94% 92% 97%  Weight:      Height:       Physical Exam Cardiovascular:     Rate and Rhythm: Rhythm irregularly irregular.     Heart sounds: Normal heart sounds, S1 normal and S2 normal.  Pulmonary:     Breath sounds: No decreased breath sounds, wheezing or rales.  Abdominal:     Palpations: Abdomen is soft.     Tenderness: There is no abdominal tenderness.  Musculoskeletal:     Right lower leg: No swelling.     Left lower leg: No swelling.  Skin:    General: Skin is warm.     Findings: No rash.  Neurological:     Mental Status: Awake, alert, confused    Family Communication: Wife at bedside  Disposition: Status is: Inpatient On  IV Antibiotics  Planned Discharge Destination: Home with Home Health vs SNF    Time spent: 55 minutes  Author: Laree Lock, MD 07/10/2024 3:37 PM  For on call review [email or web address].

## 2024-07-10 NOTE — Progress Notes (Addendum)
 Occupational Therapy Treatment Patient Details Name: Donald [name] MRN: [id number] DOB: August 19, 1946 Today's Date: 07/10/2024   History of present illness Pt is a 78 y/o M admitted on 07/03/24 after presenting with acute onset of generalized weakness. Pt tested + for COVID. Pt also being treated for a-fib with RVR, NSTEMI. PMH: DM2, dyslipidemia, RA, aortic stenosis, dementia   OT comments  Pt intermittently lethargic, falling asleep mid-sentences. Secure chat to RN & MD with RUE concerns - R elbow crease at brachialis muscle is swollen, red, and tender to touch with hard end feel upon palpitation. Pt reports 6/10 pain at site. Pt completes grooming tasks bed level, demonstrates impaired cognition and poor insight throughout. +2 to boost up in bed for repositioning. Pt left with RN in room for vital sign assessment, OT will continue to follow, discharge recommendation updated. Patient will benefit from continued inpatient follow up therapy, <3 hours/day        If plan is discharge home, recommend the following:  Help with stairs or ramp for entrance;Assistance with cooking/housework;Assist for transportation;A lot of help with bathing/dressing/bathroom;A lot of help with walking and/or transfers   Equipment Recommendations  BSC/3in1 (& RW)    Recommendations for Other Services      Precautions / Restrictions Precautions Precautions: Fall Recall of Precautions/Restrictions: Impaired Restrictions Weight Bearing Restrictions Per Provider Order: No       Mobility Bed Mobility Overal bed mobility: Needs Assistance             General bed mobility comments: NT    Transfers Overall transfer level: Needs assistance                 General transfer comment: NT     Balance Overall balance assessment: Needs assistance                                         ADL either performed or assessed with clinical judgement   ADL Overall ADL's : Needs  assistance/impaired     Grooming: Wash/dry face;Wash/dry hands;Bed level;Set up                                 General ADL Comments: OOB mobility deferred, RUE with swelling/tenderness to touch, secure chat to MD with concerns for RUE DVT. RN entering room and made aware.     Communication Communication Communication: Impaired Factors Affecting Communication: Reduced clarity of speech   Cognition Arousal: Alert Behavior During Therapy: WFL for tasks assessed/performed Cognition: History of cognitive impairments, Cognition impaired   Orientation impairments: Situation, Place Awareness: Intellectual awareness impaired Memory impairment (select all impairments): Short-term memory Attention impairment (select first level of impairment): Focused attention, Sustained attention, Selective attention, Alternating attention, Divided attention Executive functioning impairment (select all impairments): Initiation, Organization, Sequencing, Reasoning, Problem solving OT - Cognition Comments: mild confusion noted at times                 Following commands: Impaired Following commands impaired: Follows one step commands with increased time, Follows multi-step commands inconsistently      Cueing   Cueing Techniques: Verbal cues, Tactile cues, Visual cues        General Comments RUE elbow crease noted to be red, swollen, and tender to the touch. Hard end feel upon palpitation. No IV present, does not appear to  have blood drawn recently in area (no tape or gauze). Secure chat to RN and MD. Pt with DOE upon talking, on RA with O2 sats 96%    Pertinent Vitals/ Pain       Pain Assessment Pain Assessment: 0-10 Pain Score: 6  Pain Location: R elbow crease Pain Descriptors / Indicators: Aching, Discomfort, Grimacing, Pressure Pain Intervention(s): Limited activity within patient's tolerance, Monitored during session   Frequency  Min 2X/week        Progress Toward  Goals  OT Goals(current goals can now be found in the care plan section)  Progress towards OT goals: Progressing toward goals  Acute Rehab OT Goals OT Goal Formulation: With patient/family Time For Goal Achievement: 07/20/24 Potential to Achieve Goals: Good ADL Goals Pt Will Perform Lower Body Bathing: with supervision;sit to/from stand;sitting/lateral leans Pt Will Perform Lower Body Dressing: with supervision;sit to/from stand;sitting/lateral leans Pt Will Transfer to Toilet: with supervision;ambulating;with modified independence  Plan         AM-PAC OT 6 Clicks Daily Activity     Outcome Measure   Help from another person eating meals?: A Little Help from another person taking care of personal grooming?: A Little Help from another person toileting, which includes using toliet, bedpan, or urinal?: A Lot Help from another person bathing (including washing, rinsing, drying)?: A Little Help from another person to put on and taking off regular upper body clothing?: A Little Help from another person to put on and taking off regular lower body clothing?: A Lot 6 Click Score: 16    End of Session    OT Visit Diagnosis: Other abnormalities of gait and mobility (R26.89);Muscle weakness (generalized) (M62.81)   Activity Tolerance Patient tolerated treatment well   Patient Left in bed;with nursing/sitter in room;with call bell/phone within reach   Nurse Communication Mobility status (RUE swelling)        Time: 8558-8492 OT Time Calculation (min): 26 min  Charges: OT General Charges $OT Visit: 1 Visit OT Treatments $Self Care/Home Management : 8-22 mins  [name], OTR/L  07/10/24, 3:38 PM

## 2024-07-11 ENCOUNTER — Inpatient Hospital Stay

## 2024-07-11 DIAGNOSIS — U071 COVID-19: Secondary | ICD-10-CM | POA: Diagnosis not present

## 2024-07-11 DIAGNOSIS — Z515 Encounter for palliative care: Secondary | ICD-10-CM | POA: Diagnosis not present

## 2024-07-11 DIAGNOSIS — R0682 Tachypnea, not elsewhere classified: Secondary | ICD-10-CM | POA: Diagnosis not present

## 2024-07-11 DIAGNOSIS — R531 Weakness: Secondary | ICD-10-CM | POA: Diagnosis not present

## 2024-07-11 DIAGNOSIS — A419 Sepsis, unspecified organism: Secondary | ICD-10-CM | POA: Diagnosis not present

## 2024-07-11 DIAGNOSIS — I4891 Unspecified atrial fibrillation: Secondary | ICD-10-CM | POA: Diagnosis not present

## 2024-07-11 LAB — GLUCOSE, CAPILLARY
Glucose-Capillary: 120 mg/dL — ABNORMAL HIGH (ref 70–99)
Glucose-Capillary: 165 mg/dL — ABNORMAL HIGH (ref 70–99)
Glucose-Capillary: 127 mg/dL — ABNORMAL HIGH (ref 70–99)
Glucose-Capillary: 121 mg/dL — ABNORMAL HIGH (ref 70–99)

## 2024-07-11 LAB — BASIC METABOLIC PANEL WITH GFR
Anion gap: 8 (ref 5–15)
BUN: 21 mg/dL (ref 8–23)
CO2: 25 mmol/L (ref 22–32)
Calcium: 7.9 mg/dL — ABNORMAL LOW (ref 8.9–10.3)
Chloride: 101 mmol/L (ref 98–111)
Creatinine, Ser: 1.07 mg/dL (ref 0.61–1.24)
GFR, Estimated: >60 mL/min (ref >60)
Glucose, Bld: 130 mg/dL — ABNORMAL HIGH (ref 70–99)
Potassium: 3.7 mmol/L (ref 3.5–5.1)
Sodium: 134 mmol/L — ABNORMAL LOW (ref 135–145)

## 2024-07-11 LAB — BRAIN NATRIURETIC PEPTIDE: B Natriuretic Peptide: 671.1 pg/mL — ABNORMAL HIGH (ref 0.0–100.0)

## 2024-07-11 MED ORDER — FUROSEMIDE 10 MG/ML IJ SOLN
40.0000 mg | Freq: Every day | INTRAMUSCULAR | Status: DC
  Administered 2024-07-11: 40 mg via INTRAVENOUS
  Filled 2024-07-11: qty 4

## 2024-07-11 MED ORDER — DIPHENHYDRAMINE-ZINC ACETATE 2-0.1 % EX CREA: TOPICAL_CREAM | Freq: Two times a day (BID) | CUTANEOUS | Status: DC | PRN

## 2024-07-11 MED ORDER — AMIODARONE HCL 200 MG PO TABS: 200.0000 mg | ORAL_TABLET | Freq: Every day | ORAL | Status: DC

## 2024-07-11 MED ORDER — AMIODARONE HCL 200 MG PO TABS
200.0000 mg | ORAL_TABLET | Freq: Two times a day (BID) | ORAL | Status: DC
Start: 2024-07-16 — End: 2024-07-23

## 2024-07-11 MED ORDER — AMIODARONE HCL 200 MG PO TABS
400.0000 mg | ORAL_TABLET | Freq: Two times a day (BID) | ORAL | Status: DC
  Administered 2024-07-11 – 2024-07-15 (×8): 400 mg via ORAL
  Filled 2024-07-11 (×8): qty 2

## 2024-07-11 NOTE — Progress Notes (Signed)
 Progress Note   Patient: Donald [name] FMW:[id number] DOB: 01-May-1946 DOA: 07/03/2024     7 DOS: the patient was seen and examined on 07/11/2024   Brief hospital course: 78 y.o. Caucasian male with medical history significant for type 2 diabetes mellitus, dyslipidemia, rheumatoid arthritis and aortic stenosis, presented with acute onset of generalized weakness.  Patient admitted for sepsis due to COVID infection.  Hospital course complicated by A-fib with RVR, seen by cardiology. Hospital course as below  Assessment and Plan: Atrial fibrillation with rapid ventricular response - On metoprolol succinate 50 mg twice daily, Eliquis.  Aspirin was discontinued - on amiodarone 400mg  bid x 7 days, followed by 200 mg bid x 7 days, then 200 mg daily - Seen by Cardiology, appreciate recs   Non-STEMI Troponin peaked at 1099 EKG with new left bundle branch block, now with depressed ejection fraction 35% Outpatient ischemic workup per cardiology  HFrEF Echo with EF 35 to 40%, G1 DD, severe AS hypervolemic on exam, BNP elevated, on metoprolol IV lasix 40mg  x 1 dose  Symptomatic COVID infection with Hypoxia Fever 102.4 and hypoxia (needing 4 liter O2 via n.c.) on 10/26-chest x-ray on 10/26 concerning for pneumonia Treated with 3 days of IV remdesevir On IV Zosyn until 11/01  Acute metabolic encephalopathy - resolved - Suspect secondary to pneumonia, underlying dementia, hospital acquired delirium - Delirium precautions, has waxing and waning episodes  RUE pain - Has swelling and tenderness RUE - US  neg for DVT, superficial thrombophlebitis - Supportive care  Dyslipidemia On Crestor   Rheumatoid arthritis (HCC) Hold Arava due to ongoing infection concerns  Controlled type 2 diabetes mellitus without complication, without long-term current use of insulin (HCC) Hemoglobin A1c low at 5.7.   continue sliding scale insulin  Dementia without behavioral disturbance (HCC) On  Exelon  Vitamin B12 deficiency Continue vitamin B12  Severe aortic stenosis - Follow-up with primary cardiology in [place], for possible TAVR work up  GOC Palliative care seen   Subjective: Patient was very sleepy on my exam in the morning, during my revisit at noon, more awake and alert and interactive Will give 1 dose of IV Lasix for mild hypervolemia. Also has itching on his back, will do topical Benadryl  Plan for discharge to SNF when ready, discussed plan of care with wife at the bedside [facility] working on SNF placement  Physical Exam: Vitals:   07/11/24 0006 07/11/24 0319 07/11/24 0752 07/11/24 1210  BP: 128/68 131/67 (!) 148/72 (!) 119/58  Pulse: 77 74 77 70  Resp: (!) 22 (!) 24 (!) 22 20  Temp: 98.5 F (36.9 C) 98.3 F (36.8 C) 98.6 F (37 C) 97.9 F (36.6 C)  TempSrc:      SpO2: 94% 93% 93% 95%  Weight:      Height:       Physical Exam Cardiovascular:     Rate and Rhythm: Rhythm irregularly irregular.     Heart sounds: Normal heart sounds, S1 normal and S2 normal.  Pulmonary:     Breath sounds: No decreased breath sounds, wheezing or rales.  Abdominal:     Palpations: Abdomen is soft.     Tenderness: There is no abdominal tenderness.  Musculoskeletal:     Right lower leg: No swelling.     Left lower leg: No swelling.  Skin:    General: Skin is warm.     Findings: No rash.  Neurological:     Mental Status: Awake, alert, confused    Family Communication: Wife  at bedside  Disposition: Status is: Inpatient On IV Antibiotics, IV lasix  Planned Discharge Destination: Home with Home Health vs SNF    Time spent: 55 minutes  Author: Laree Lock, MD 07/11/2024 2:37 PM  For on call review [email or web address].

## 2024-07-11 NOTE — Plan of Care (Signed)
  Problem: Coping: Goal: Ability to adjust to condition or change in health will improve Outcome: Progressing   Problem: Nutritional: Goal: Maintenance of adequate nutrition will improve Outcome: Progressing   Problem: Tissue Perfusion: Goal: Adequacy of tissue perfusion will improve Outcome: Progressing   Problem: Clinical Measurements: Goal: Cardiovascular complication will be avoided Outcome: Progressing   Problem: Elimination: Goal: Will not experience complications related to urinary retention Outcome: Progressing

## 2024-07-11 NOTE — Progress Notes (Signed)
 Brief cardiology progress note:  Patient has been maintaining sinus rhythm on oral amiodarone. He is planned for outpatient evaluation of ischemia/aortic stenosis/cardiomyopathy. He is on metoprolol succinate for GDMT currently.  He is pending SNF placement. Cardiology will sign off, see medication recommendations below. We will request outpatient cardiology follow up.  [facility] will sign off.    Medication Recommendations:   Amiodarone 400 mg BID for 7 days, then 200 mg BID for 7 days, then 200 mg daily Apixaban 5 mg BID Metoprolol succinate 50 mg BID Rosuvastatin  10 mg daily  His aspirin was stopped this admission with start of apixaban based on note from 07/04/24  Other recommendations (labs, testing, etc):  discuss outpatient cath for further workup of cardiomyopathy and aortic stenosis (need to determine if it will be done in [place] with structural team or in [place]) Follow up as an outpatient:  I have requested outpatient follow up with cardiology  Shelda Bruckner, MD, PhD, [facility] [place]  [facility]  [place]  [facility] at [facility] [address] [place], [place] [address] [phone]

## 2024-07-11 NOTE — Progress Notes (Signed)
 Daily Progress Note   Patient Name: Donald [name]       Date: 07/11/2024 DOB: 08-19-1946  Age: 78 y.o. MRN#: [id number] Attending Physician: Jerelene Critchley, MD Primary Care Physician: Glendia Shad, MD Admit Date: 07/03/2024  Reason for Consultation/Follow-up: Establishing goals of care  HPI/Brief Hospital Review: 78 y.o. male  with past medical history significant for DM II, dyslipidemia, aortic stenosis and RA. Patient presented to ED 07/03/2024 from home c/o acute onset generalized weakness, decreased PO intake, sore throat, cough and congestion.    Patient found to be Covid +. Labs significant for RBC 3.69, Hgb 12.5, CO2 21, Ca++ 8.7, lactic 2.3, UA with glucose, ketones and proteinuria, negative for infection. CXR with no acute cardiopulmonary process.    TRH was consulted for admission and management of sepsis d/t Covid 19.    10/25 Patient found to have Afib with RVR, HR >135 and new RBBB. Cardiology consulted. Has converted to NSR, started on amiodarone, metoprolol and apixaban. Plan for ischemic work up for NSTEMI. HFrEF (EF significantly declined on echo this admission 35-40% with severe AS)   Palliative care consulted for assistance with goals of care discussions.   Subjective: Extensive chart review has been completed prior to meeting patient including labs, vital signs, imaging, progress notes, orders, and available advanced directive documents from current and previous encounters.    Visit with Mr. Colan at his bedside.  He is resting in bed, easily awakens to calling of his name but drifts back to sleep without constant redirection.  Wife at bedside during time of visit.  Reviewed overall hospital course with wife.  We discussed his COVID diagnosis as well as underlying  heart disease with the need for further follow-up as an outpatient with his cardiologist.  Wife verbalizes understanding.  We briefly addressed goals of care for which wife shares goals to remain as they are at this time.  Encouraged wife and Mr. Petruska to continue conversations regarding goals of care especially once further evaluation is done regarding to his underlying cardiac function.  Wife continues to express her concern related to Mr. Bruyere going home and remains open to short-term rehab.  Addressed this concern with primary team as well as TOC covering for the weekend, as wife has had minimal contact with team regarding process of discharge disposition.  She expresses her frustrations related to lack of communication.  Assured wife that I would follow-up later today once more information received from team regarding disposition plan.  Return to bedside later in the day.  Daughter at bedside, wife went home to rest.  Per family there has been no further communication regarding disposition.  Ongoing discussions had with family, they provided list of preferences such as [facility] and [address] for short-term rehab.  Names of facility preferences will be sent to team.  Family also voices they are concerned with Mr. Gerber receiving minimal therapy during admission.  Discussed lack of resources for patients to receive daily therapy while hospitalized but encouraged family to reach out to nursing staff with assistance with mobility in the room.  Assisted nursing staff and transferring Mr. Suder to bed to recliner.  He utilized walker for stability and required 2+ moderate assistance with transfer to bed to chair.  During transfer he denied dizziness or any other acute symptoms.  He felt he was pretty steady on his feet but can acknowledge the weakness that he has.  Expressed to daughter in room my concern for Mr. Bistline going home with his wife as he required moderate assistance from 2 people for a very simple  transfer for which she agreed.  Answered and addressed all questions and concerns.  PMT to continue to follow for ongoing needs and support.  Objective:  Physical Exam Constitutional:      General: He is not in acute distress.    Appearance: He is ill-appearing.  HENT:     Head: Normocephalic.     Mouth/Throat:     Mouth: Mucous membranes are dry.  Pulmonary:     Effort: Pulmonary effort is normal. No respiratory distress.  Abdominal:     General: Abdomen is flat.     Palpations: Abdomen is soft.  Skin:    General: Skin is warm and dry.  Neurological:     Mental Status: He is alert and oriented to person, place, and time.     Motor: Weakness present.  Psychiatric:        Mood and Affect: Mood normal.        Behavior: Behavior normal.        Thought Content: Thought content normal.             Vital Signs: BP 130/65 (BP Location: Right Arm)   Pulse 67   Temp 98.6 F (37 C)   Resp 20   Ht 5' 10 (1.778 m)   Wt 77.1 kg   SpO2 95%   BMI 24.39 kg/m  SpO2: SpO2: 95 % O2 Device: O2 Device: Nasal Cannula O2 Flow Rate: O2 Flow Rate (L/min): 2 L/min   Palliative Care Assessment & Plan   Assessment/Recommendation/Plan  Continue with current plan of care Disposition pending  Care plan was discussed with primary team and TOC as well as nursing staff.  Thank you for allowing the Palliative Medicine Team to assist in the care of this patient.  Visit includes: Detailed review of medical records (labs, imaging, vital signs), medically appropriate exam (mental status, respiratory, cardiac, skin), discussed with treatment team, counseling and educating patient, family and staff, documenting clinical information, medication management and coordination of care.  Waddell Lesches, DNP, AGNP-C Palliative Medicine   Please contact Palliative Medicine Team phone at [phone] for questions and concerns.

## 2024-07-12 DIAGNOSIS — R531 Weakness: Secondary | ICD-10-CM | POA: Diagnosis not present

## 2024-07-12 DIAGNOSIS — U071 COVID-19: Secondary | ICD-10-CM | POA: Diagnosis not present

## 2024-07-12 DIAGNOSIS — I4891 Unspecified atrial fibrillation: Secondary | ICD-10-CM | POA: Diagnosis not present

## 2024-07-12 DIAGNOSIS — Z515 Encounter for palliative care: Secondary | ICD-10-CM | POA: Diagnosis not present

## 2024-07-12 LAB — MAGNESIUM: Magnesium: 2 mg/dL (ref 1.7–2.4)

## 2024-07-12 LAB — BASIC METABOLIC PANEL WITH GFR
Anion gap: 13 (ref 5–15)
BUN: 20 mg/dL (ref 8–23)
CO2: 21 mmol/L — ABNORMAL LOW (ref 22–32)
Calcium: 8 mg/dL — ABNORMAL LOW (ref 8.9–10.3)
Chloride: 102 mmol/L (ref 98–111)
Creatinine, Ser: 1.09 mg/dL (ref 0.61–1.24)
GFR, Estimated: >60 mL/min (ref >60)
Glucose, Bld: 110 mg/dL — ABNORMAL HIGH (ref 70–99)
Potassium: 5.4 mmol/L — ABNORMAL HIGH (ref 3.5–5.1)
Sodium: 136 mmol/L (ref 135–145)

## 2024-07-12 LAB — GLUCOSE, CAPILLARY
Glucose-Capillary: 119 mg/dL — ABNORMAL HIGH (ref 70–99)
Glucose-Capillary: 112 mg/dL — ABNORMAL HIGH (ref 70–99)
Glucose-Capillary: 130 mg/dL — ABNORMAL HIGH (ref 70–99)
Glucose-Capillary: 119 mg/dL — ABNORMAL HIGH (ref 70–99)

## 2024-07-12 MED ORDER — FUROSEMIDE 20 MG PO TABS
20.0000 mg | ORAL_TABLET | Freq: Once | ORAL | Status: AC
  Administered 2024-07-12: 20 mg via ORAL
  Filled 2024-07-12: qty 1

## 2024-07-12 MED ORDER — SODIUM ZIRCONIUM CYCLOSILICATE 10 G PO PACK
10.0000 g | PACK | Freq: Once | ORAL | Status: AC
  Administered 2024-07-12: 10 g via ORAL
  Filled 2024-07-12: qty 1

## 2024-07-12 NOTE — TOC Initial Note (Signed)
 Transition of Care [facility]) - Initial/Assessment Note    Patient Details  Name: Donald [name] MRN: [id number] Date of Birth: November 23, 1945  Transition [facility]) CM/SW Contact:    Zacheriah Stumpe L Lorra Freeman, LCSW Phone Number: 07/12/2024, 11:45 AM  Clinical Narrative:                   CSW spoke with spouse regarding recommendation for SNF. Mrs. Yoshino advised that she is agreeable to SNF if patient can be placed at [facility] or [address] of [place]. Mrs. Anastos understands that those facilities typically offer beds to their community residents. She advised that if there are no bed offers from those facilities, her husband will be discharging home with Home Health.   Expected Discharge Plan: Home/Self Care Barriers to Discharge: Continued Medical Work up   Patient Goals and CMS Choice            Expected Discharge Plan and Services     Post Acute Care Choice: NA Living arrangements for the past 2 months: Single Family Home                                      Prior Living Arrangements/Services Living arrangements for the past 2 months: Single Family Home Lives with:: Spouse Patient language and need for interpreter reviewed:: Yes Do you feel safe going back to the place where you live?: Yes      Need for Family Participation in Patient Care: Yes (Comment) Care giver support system in place?: Yes (comment)   Criminal Activity/Legal Involvement Pertinent to Current Situation/Hospitalization: No - Comment as needed  Activities of Daily Living   ADL Screening (condition at time of admission) Independently performs ADLs?: No Does the patient have a NEW difficulty with bathing/dressing/toileting/self-feeding that is expected to last >3 days?: No Does the patient have a NEW difficulty with getting in/out of bed, walking, or climbing stairs that is expected to last >3 days?: No Does the patient have a NEW difficulty with communication that is expected to last >3 days?: No Is the  patient deaf or have difficulty hearing?: No Does the patient have difficulty seeing, even when wearing glasses/contacts?: No Does the patient have difficulty concentrating, remembering, or making decisions?: No  Permission Sought/Granted                  Emotional Assessment Appearance:: Appears stated age Attitude/Demeanor/Rapport: Engaged, Gracious Affect (typically observed): Appropriate, Calm, Pleasant Orientation: : Oriented to Self, Oriented to Place, Oriented to  Time, Oriented to Situation Alcohol / Substance Use: Not Applicable Psych Involvement: No (comment)  Admission diagnosis:  Tachypnea [R06.82] Weakness [R53.1] Sepsis, due to unspecified organism, unspecified whether acute organ dysfunction present (HCC) [A41.9] Sepsis due to COVID-19 (HCC) [U07.1, A41.89] COVID [U07.1] Acute cough [R05.1] Patient Active Problem List   Diagnosis Date Noted   Non-ST elevation (NSTEMI) myocardial infarction (HCC) 07/08/2024   Dilated cardiomyopathy (HCC) 07/05/2024   PAF (paroxysmal atrial fibrillation) (HCC) 07/05/2024   Sepsis (HCC) 07/04/2024   Tachypnea 07/04/2024   Weakness 07/04/2024   Atrial fibrillation with rapid ventricular response (HCC) 07/04/2024   Dementia without behavioral disturbance (HCC) 07/04/2024   Sepsis due to COVID-19 (HCC) 07/04/2024   COVID 07/03/2024   Controlled type 2 diabetes mellitus without complication, without long-term current use of insulin (HCC) 07/03/2024   Dyslipidemia 07/03/2024   Vitamin B12 deficiency 07/03/2024   Colon cancer screening  03/13/2024   Type 2 diabetes mellitus with hyperglycemia (HCC) 04/30/2023   TIA (transient ischemic attack) 08/25/2022   Vivid dream 08/25/2022   Low back pain 07/18/2022   Elevated blood pressure reading 07/18/2022   Head trauma 07/17/2022   Expressive aphasia 07/17/2022   Anemia 01/10/2022   Decreased GFR 08/05/2018   Shingles 06/17/2018   Skin cancer 02/01/2018   Skin lesion of cheek  09/29/2017   Aortic stenosis 01/19/2017   Acute cough 12/04/2016   Left carotid bruit 06/04/2016   Health care maintenance 12/06/2014   Hypercholesterolemia 11/29/2014   Heme positive stool 02/17/2014   Restless legs 11/29/2013   Diabetes (HCC) 06/07/2013   Nephrolithiasis 06/07/2013   Rheumatoid arthritis (HCC) 06/07/2013   Carpal tunnel syndrome 06/07/2013   PCP:  Glendia Shad, MD Pharmacy:   [facility] - [place], [place] - [address] [place] [address] Phone: [phone] Fax: [phone]  [facility] [address] [place] - [address] [place] [address] Phone: [phone] Fax: [phone]     Social Drivers of Health (SDOH) Social History: SDOH Screenings   Food Insecurity: No Food Insecurity (07/04/2024)  Housing: Low Risk  (07/04/2024)  Transportation Needs: No Transportation Needs (07/04/2024)  Utilities: Not At Risk (07/04/2024)  Alcohol Screen: Low Risk  (08/20/2023)  Depression (PHQ2-9): Low Risk  (08/20/2023)  Financial Resource Strain: Low Risk  (12/19/2023)   Received from [facility]  Physical Activity: Inactive (08/20/2023)  Social Connections: Moderately Integrated (07/04/2024)  Stress: No Stress Concern Present (08/20/2023)  Tobacco Use: Medium Risk (07/03/2024)  Health Literacy: Adequate Health Literacy (08/20/2023)   SDOH Interventions:     Readmission Risk Interventions     No data to display

## 2024-07-12 NOTE — Plan of Care (Signed)
  Problem: Coping: Goal: Ability to adjust to condition or change in health will improve Outcome: Progressing   Problem: Metabolic: Goal: Ability to maintain appropriate glucose levels will improve Outcome: Progressing   Problem: Nutritional: Goal: Maintenance of adequate nutrition will improve Outcome: Progressing   Problem: Clinical Measurements: Goal: Ability to maintain clinical measurements within normal limits will improve Outcome: Progressing Goal: Will remain free from infection Outcome: Progressing Goal: Respiratory complications will improve Outcome: Progressing   Problem: Activity: Goal: Risk for activity intolerance will decrease Outcome: Progressing

## 2024-07-12 NOTE — Progress Notes (Signed)
 Daily Progress Note   Patient Name: Donald [name]       Date: 07/12/2024 DOB: 21-Dec-1945  Age: 78 y.o. MRN#: [id number] Attending Physician: Jerelene Critchley, MD Primary Care Physician: Glendia Shad, MD Admit Date: 07/03/2024  Reason for Consultation/Follow-up: Establishing goals of care  HPI/Brief Hospital Review: 78 y.o. male  with past medical history significant for DM II, dyslipidemia, aortic stenosis and RA. Patient presented to ED 07/03/2024 from home c/o acute onset generalized weakness, decreased PO intake, sore throat, cough and congestion.    Patient found to be Covid +. Labs significant for RBC 3.69, Hgb 12.5, CO2 21, Ca++ 8.7, lactic 2.3, UA with glucose, ketones and proteinuria, negative for infection. CXR with no acute cardiopulmonary process.    TRH was consulted for admission and management of sepsis d/t Covid 19.    10/25 Patient found to have Afib with RVR, HR >135 and new RBBB. Cardiology consulted. Has converted to NSR, started on amiodarone, metoprolol and apixaban. Plan for ischemic work up for NSTEMI. HFrEF (EF significantly declined on echo this admission 35-40% with severe AS)   Palliative care consulted for assistance with goals of care discussions.   Subjective: Extensive chart review has been completed prior to meeting patient including labs, vital signs, imaging, progress notes, orders, and available advanced directive documents from current and previous encounters.    Visited with Donald [name] at his bedside.  He is awake, alert and able to engage in conversations.  Wife and grandson at bedside during time of visit.  Assess symptoms.  Donald [name] reports feeling well today, rested well overnight and able to tolerate breakfast.  Wife again reports she has not had any  recent calm medication regarding discharge disposition.  Secure chat sent again to primary team and [facility] covering Donald [name] for today related to discussions had with wife regarding discharge disposition and facility preferences.  With the nursing staff assistance we assisted Donald [name] from bed to recliner.  Donald [name] required use of walker for assistance as well as 2+ moderate assistance to transfer short distance from bed to chair.  Compared to yesterday's transfer Donald [name] seems to have had a bit more strength and energy today but still required assistance.  Discussions had with grandson at bedside regarding concern for Donald [name]'s safety as well as his  wife's safety if he were able to return home without support.  Wife shares her frustrations related to previous rehab facilities not having a focus on rehab.  She feels Donald [name] would be more benefited at home if he were not to go to a facility such as her preferences.  TOC engaging in disposition planning.  Answered and addressed all questions and concerns.  PMT to continue to follow for ongoing needs and support.  Objective:  Physical Exam Constitutional:      General: He is not in acute distress.    Appearance: He is ill-appearing.  HENT:     Head: Normocephalic.     Mouth/Throat:     Mouth: Mucous membranes are dry.  Pulmonary:     Effort: Pulmonary effort is normal. No respiratory distress.  Abdominal:     Palpations: Abdomen is soft.  Skin:    General: Skin is warm and dry.  Neurological:     Mental Status: He is alert and oriented to person, place, and time.     Motor: Weakness present.  Psychiatric:        Mood and Affect: Mood normal.        Behavior: Behavior normal.        Thought Content: Thought content normal.             Vital Signs: BP (!) 117/58 (BP Location: Right Arm)   Pulse 70   Temp 98.2 F (36.8 C) (Oral)   Resp 17   Ht 5' 10 (1.778 m)   Wt 75.8 kg   SpO2 96%   BMI 23.98 kg/m  SpO2: SpO2: 96 % O2 Device:  O2 Device: Room Air O2 Flow Rate: O2 Flow Rate (L/min): 2 L/min   Palliative Care Assessment & Plan   Assessment/Recommendation/Plan  Continue with current plan of care Pending disposition, home versus short-term rehab  Thank you for allowing the Palliative Medicine Team to assist in the care of this patient.  Visit includes: Detailed review of medical records (labs, imaging, vital signs), medically appropriate exam (mental status, respiratory, cardiac, skin), discussed with treatment team, counseling and educating patient, family and staff, documenting clinical information, medication management and coordination of care.  Donald Lesches, DNP, AGNP-C Palliative Medicine   Please contact Palliative Medicine Team phone at [phone] for questions and concerns.

## 2024-07-12 NOTE — Progress Notes (Signed)
 Progress Note   Patient: Donald [name] FMW:[id number] DOB: April 14, 1946 DOA: 07/03/2024     8 DOS: the patient was seen and examined on 07/12/2024   Brief hospital course: 78 y.o. Caucasian male with medical history significant for type 2 diabetes mellitus, dyslipidemia, rheumatoid arthritis and aortic stenosis, presented with acute onset of generalized weakness.  Patient admitted for sepsis due to COVID infection.  Hospital course complicated by A-fib with RVR, seen by cardiology. Hospital course as below  Assessment and Plan: Atrial fibrillation with rapid ventricular response - On metoprolol succinate 50 mg twice daily, Eliquis.  Aspirin was discontinued - on amiodarone 400mg  bid x 7 days, followed by 200 mg bid x 7 days, then 200 mg daily - Seen by Cardiology, appreciate recs   Non-STEMI Troponin peaked at 1099 EKG with new left bundle branch block, now with depressed ejection fraction 35% Outpatient ischemic workup per cardiology  HFrEF Echo with EF 35 to 40%, G1 DD, severe AS hypervolemic on exam, BNP elevated, on metoprolol Lasix 20mg  po x 1 dose  Symptomatic COVID infection with Hypoxia Fever 102.4 and hypoxia (needing 4 liter O2 via n.c.) on 10/26-chest x-ray on 10/26 concerning for pneumonia S/p Remdesivir and completed antibiotic course  Acute metabolic encephalopathy - resolved - Suspect secondary to pneumonia, underlying dementia, hospital acquired delirium - Delirium precautions, has waxing and waning episodes  RUE pain - Has swelling and tenderness RUE - US  neg for DVT, superficial thrombophlebitis - Supportive care  Mild hyperkalemia - 1 dose Lokelma 10g  Dyslipidemia On Crestor   Rheumatoid arthritis (HCC) Hold Arava due to ongoing infection concerns  Controlled type 2 diabetes mellitus without complication, without long-term current use of insulin (HCC) Hemoglobin A1c low at 5.7.   continue sliding scale insulin  Dementia without behavioral  disturbance (HCC) On Exelon  Vitamin B12 deficiency Continue vitamin B12  Severe aortic stenosis - Follow-up with primary cardiology in [place], for possible TAVR work up  [facility]OC Palliative care seen   Subjective: Patient examined at the bedside, comfortably sitting in the chair.  Denies any symptoms today. Wife present at the bedside.  Will give 1 dose of Lasix for mild crackles. Medically ready, pending SNF placement  Physical Exam: Vitals:   07/12/24 0817 07/12/24 0851 07/12/24 1037 07/12/24 1348  BP: (!) 138/44 (!) 147/75  (!) 117/58  Pulse: 74 77  70  Resp: 16 20  17   Temp: 98.4 F (36.9 C) 98.2 F (36.8 C)  98.2 F (36.8 C)  TempSrc:    Oral  SpO2: 96% 97%  96%  Weight:   75.8 kg   Height:       Physical Exam Cardiovascular:     Rate and Rhythm: Rhythm irregularly irregular.     Heart sounds: Normal heart sounds, S1 normal and S2 normal.  Pulmonary:     Breath sounds: No decreased breath sounds, wheezing or rales.  Abdominal:     Palpations: Abdomen is soft.     Tenderness: There is no abdominal tenderness.  Musculoskeletal:     Right lower leg: No swelling.     Left lower leg: No swelling.  Skin:    General: Skin is warm.     Findings: No rash.  Neurological:     Mental Status: Awake, alert, confused    Family Communication: Wife at bedside  Disposition: Status is: Inpatient Medically ready  Planned Discharge Destination: SNF    Time spent: 35 minutes  Author: Laree Lock, MD 07/12/2024 3:13 PM  For on call review [email or web address].

## 2024-07-12 NOTE — Plan of Care (Signed)
  Problem: Education: Goal: Ability to describe self-care measures that may prevent or decrease complications (Diabetes Survival Skills Education) will improve Outcome: Progressing Goal: Individualized Educational Video(s) Outcome: Progressing   Problem: Coping: Goal: Ability to adjust to condition or change in health will improve Outcome: Progressing   Problem: Fluid Volume: Goal: Ability to maintain a balanced intake and output will improve Outcome: Progressing   Problem: Health Behavior/Discharge Planning: Goal: Ability to identify and utilize available resources and services will improve Outcome: Progressing Goal: Ability to manage health-related needs will improve Outcome: Progressing   Problem: Metabolic: Goal: Ability to maintain appropriate glucose levels will improve Outcome: Progressing   Problem: Nutritional: Goal: Maintenance of adequate nutrition will improve Outcome: Progressing Goal: Progress toward achieving an optimal weight will improve Outcome: Progressing   Problem: Skin Integrity: Goal: Risk for impaired skin integrity will decrease Outcome: Progressing   Problem: Tissue Perfusion: Goal: Adequacy of tissue perfusion will improve Outcome: Progressing   Problem: Education: Goal: Knowledge of General Education information will improve Description: Including pain rating scale, medication(s)/side effects and non-pharmacologic comfort measures Outcome: Progressing   Problem: Health Behavior/Discharge Planning: Goal: Ability to manage health-related needs will improve Outcome: Progressing   Problem: Clinical Measurements: Goal: Ability to maintain clinical measurements within normal limits will improve Outcome: Progressing Goal: Will remain free from infection Outcome: Progressing Goal: Diagnostic test results will improve Outcome: Progressing Goal: Respiratory complications will improve Outcome: Progressing Goal: Cardiovascular complication will  be avoided Outcome: Progressing   Problem: Activity: Goal: Risk for activity intolerance will decrease Outcome: Progressing   Problem: Nutrition: Goal: Adequate nutrition will be maintained Outcome: Progressing   Problem: Coping: Goal: Level of anxiety will decrease Outcome: Progressing   Problem: Elimination: Goal: Will not experience complications related to bowel motility Outcome: Progressing Goal: Will not experience complications related to urinary retention Outcome: Progressing   Problem: Pain Managment: Goal: General experience of comfort will improve and/or be controlled Outcome: Progressing   Problem: Safety: Goal: Ability to remain free from injury will improve Outcome: Progressing   Problem: Skin Integrity: Goal: Risk for impaired skin integrity will decrease Outcome: Progressing   Problem: Education: Goal: Knowledge of risk factors and measures for prevention of condition will improve Outcome: Progressing   Problem: Coping: Goal: Psychosocial and spiritual needs will be supported Outcome: Progressing   Problem: Respiratory: Goal: Will maintain a patent airway Outcome: Progressing Goal: Complications related to the disease process, condition or treatment will be avoided or minimized Outcome: Progressing

## 2024-07-12 NOTE — NC FL2 (Signed)
 [facility]  MEDICAID [id number] LEVEL OF CARE FORM     IDENTIFICATION  Patient Name: Donald [name] Birthdate: Apr 15, 1946 Sex: male Admission Date (Current Location): 07/03/2024  [place] and [place] Number:  Chiropodist and Address:  [facility] [address] [place], [place] [address]      Provider Number: [id number]  Attending Physician Name and Address:  Jerelene Critchley, MD  Relative Name and Phone Number:  Jkwon, [place]ow (Spouse)  [phone]    Current Level of Care: Hospital Recommended Level of Care: Skilled Nursing Facility Prior Approval Number:    Date Approved/Denied:   PASRR Number: [id number]  Discharge Plan: Home    Current Diagnoses: Patient Active Problem List   Diagnosis Date Noted   Non-ST elevation (NSTEMI) myocardial infarction (HCC) 07/08/2024   Dilated cardiomyopathy (HCC) 07/05/2024   PAF (paroxysmal atrial fibrillation) (HCC) 07/05/2024   Sepsis (HCC) 07/04/2024   Tachypnea 07/04/2024   Weakness 07/04/2024   Atrial fibrillation with rapid ventricular response (HCC) 07/04/2024   Dementia without behavioral disturbance (HCC) 07/04/2024   Sepsis due to COVID-19 (HCC) 07/04/2024   COVID 07/03/2024   Controlled type 2 diabetes mellitus without complication, without long-term current use of insulin (HCC) 07/03/2024   Dyslipidemia 07/03/2024   Vitamin B12 deficiency 07/03/2024   Colon cancer screening 03/13/2024   Type 2 diabetes mellitus with hyperglycemia (HCC) 04/30/2023   TIA (transient ischemic attack) 08/25/2022   Vivid dream 08/25/2022   Low back pain 07/18/2022   Elevated blood pressure reading 07/18/2022   Head trauma 07/17/2022   Expressive aphasia 07/17/2022   Anemia 01/10/2022   Decreased GFR 08/05/2018   Shingles 06/17/2018   Skin cancer 02/01/2018   Skin lesion of cheek 09/29/2017   Aortic stenosis 01/19/2017   Acute cough 12/04/2016   Left carotid bruit 06/04/2016   Health care maintenance  12/06/2014   Hypercholesterolemia 11/29/2014   Heme positive stool 02/17/2014   Restless legs 11/29/2013   Diabetes (HCC) 06/07/2013   Nephrolithiasis 06/07/2013   Rheumatoid arthritis (HCC) 06/07/2013   Carpal tunnel syndrome 06/07/2013    Orientation RESPIRATION BLADDER Height & Weight     Self, Situation, Place  O2 Continent Weight: 167 lb 2 oz (75.8 kg) Height:  5' 10 (177.8 cm)  BEHAVIORAL SYMPTOMS/MOOD NEUROLOGICAL BOWEL NUTRITION STATUS      Continent Diet  AMBULATORY STATUS COMMUNICATION OF NEEDS Skin   Limited Assist Verbally Normal                       Personal Care Assistance Level of Assistance  Bathing, Feeding, Dressing Bathing Assistance: Limited assistance Feeding assistance: Independent Dressing Assistance: Limited assistance     Functional Limitations Info  Sight, Hearing, Speech          SPECIAL CARE FACTORS FREQUENCY  PT (By licensed PT), OT (By licensed OT)     PT Frequency: 2x OT Frequency: 2x            Contractures Contractures Info: Not present    Additional Factors Info  Code Status, Allergies Code Status Info: FULL Allergies Info: NKA           Current Medications (07/12/2024):  This is the current hospital active medication list Current Facility-Administered Medications  Medication Dose Route Frequency Provider Last Rate Last Admin   acetaminophen  (TYLENOL ) tablet 650 mg  650 mg Oral Q6H PRN Mansy, Jan A, MD   650 mg at 07/10/24 1532   Or   acetaminophen  (TYLENOL ) suppository  650 mg  650 mg Rectal Q6H PRN Mansy, Madison LABOR, MD       [START ON 07/16/2024] amiodarone (PACERONE) tablet 200 mg  200 mg Oral BID Ponnala, Shruthi, MD       [START ON 07/23/2024] amiodarone (PACERONE) tablet 200 mg  200 mg Oral Daily Ponnala, Shruthi, MD       amiodarone (PACERONE) tablet 400 mg  400 mg Oral BID Ponnala, Shruthi, MD   400 mg at 07/12/24 0952   apixaban (ELIQUIS) tablet 5 mg  5 mg Oral BID Perley Lum DEL, RPH   5 mg at 07/12/24 9047    cyanocobalamin  (VITAMIN B12) tablet 1,000 mcg  1,000 mcg Oral Daily Mansy, Jan A, MD   1,000 mcg at 07/12/24 0952   diphenhydrAMINE-zinc acetate (BENADRYL) 2-0.1 % cream   Topical BID PRN Ponnala, Shruthi, MD       guaiFENesin-dextromethorphan (ROBITUSSIN DM) 100-10 MG/5ML syrup 10 mL  10 mL Oral Q4H PRN Duncan, Hazel V, MD   10 mL at 07/09/24 2213   insulin aspart (novoLOG) injection 0-15 Units  0-15 Units Subcutaneous TID WC Mansy, Jan A, MD   3 Units at 07/11/24 1232   insulin aspart (novoLOG) injection 0-5 Units  0-5 Units Subcutaneous QHS Mansy, Jan A, MD   2 Units at 07/05/24 2151   loratadine (CLARITIN) tablet 10 mg  10 mg Oral Daily Mansy, Jan A, MD   10 mg at 07/12/24 9047   magnesium hydroxide (MILK OF MAGNESIA) suspension 30 mL  30 mL Oral Daily PRN Mansy, Jan A, MD       magnesium oxide (MAG-OX) tablet 400 mg  400 mg Oral Daily Mansy, Jan A, MD   400 mg at 07/12/24 9047   metoprolol succinate (TOPROL-XL) 24 hr tablet 50 mg  50 mg Oral BID Gollan, Timothy J, MD   50 mg at 07/12/24 9048   multivitamin with minerals tablet 1 tablet  1 tablet Oral Daily Mansy, Jan A, MD   1 tablet at 07/12/24 0952   ondansetron (ZOFRAN) tablet 4 mg  4 mg Oral Q6H PRN Mansy, Jan A, MD       Or   ondansetron (ZOFRAN) injection 4 mg  4 mg Intravenous Q6H PRN Mansy, Jan A, MD       rivastigmine (EXELON) capsule 1.5 mg  1.5 mg Oral BID Mansy, Jan A, MD   1.5 mg at 07/12/24 1057   rosuvastatin  (CRESTOR ) tablet 10 mg  10 mg Oral Daily Mansy, Jan A, MD   10 mg at 07/12/24 9047   sodium zirconium cyclosilicate (LOKELMA) packet 10 g  10 g Oral Once Ponnala, Shruthi, MD       traZODone (DESYREL) tablet 25 mg  25 mg Oral QHS PRN Mansy, Madison LABOR, MD         Discharge Medications: Please see discharge summary for a list of discharge medications.  Relevant Imaging Results:  Relevant Lab Results:   Additional Information [id number]  Donald [name] L [name], [place]

## 2024-07-13 DIAGNOSIS — I4891 Unspecified atrial fibrillation: Secondary | ICD-10-CM | POA: Diagnosis not present

## 2024-07-13 LAB — BASIC METABOLIC PANEL WITH GFR
Anion gap: 11 (ref 5–15)
BUN: 21 mg/dL (ref 8–23)
CO2: 24 mmol/L (ref 22–32)
Calcium: 8.3 mg/dL — ABNORMAL LOW (ref 8.9–10.3)
Chloride: 99 mmol/L (ref 98–111)
Creatinine, Ser: 1.19 mg/dL (ref 0.61–1.24)
GFR, Estimated: >60 mL/min (ref >60)
Glucose, Bld: 111 mg/dL — ABNORMAL HIGH (ref 70–99)
Potassium: 3.5 mmol/L (ref 3.5–5.1)
Sodium: 134 mmol/L — ABNORMAL LOW (ref 135–145)

## 2024-07-13 LAB — MAGNESIUM: Magnesium: 2.3 mg/dL (ref 1.7–2.4)

## 2024-07-13 LAB — BRAIN NATRIURETIC PEPTIDE: B Natriuretic Peptide: 618.3 pg/mL — ABNORMAL HIGH (ref 0.0–100.0)

## 2024-07-13 LAB — GLUCOSE, CAPILLARY
Glucose-Capillary: 112 mg/dL — ABNORMAL HIGH (ref 70–99)
Glucose-Capillary: 136 mg/dL — ABNORMAL HIGH (ref 70–99)
Glucose-Capillary: 171 mg/dL — ABNORMAL HIGH (ref 70–99)
Glucose-Capillary: 123 mg/dL — ABNORMAL HIGH (ref 70–99)

## 2024-07-13 MED ORDER — FUROSEMIDE 10 MG/ML IJ SOLN
40.0000 mg | Freq: Once | INTRAMUSCULAR | Status: AC
  Administered 2024-07-13: 40 mg via INTRAVENOUS
  Filled 2024-07-13: qty 4

## 2024-07-13 NOTE — Plan of Care (Signed)
  Problem: Education: Goal: Ability to describe self-care measures that may prevent or decrease complications (Diabetes Survival Skills Education) will improve Outcome: Progressing Goal: Individualized Educational Video(s) Outcome: Progressing   Problem: Coping: Goal: Ability to adjust to condition or change in health will improve Outcome: Progressing   Problem: Fluid Volume: Goal: Ability to maintain a balanced intake and output will improve Outcome: Progressing   Problem: Health Behavior/Discharge Planning: Goal: Ability to identify and utilize available resources and services will improve Outcome: Progressing Goal: Ability to manage health-related needs will improve Outcome: Progressing   Problem: Metabolic: Goal: Ability to maintain appropriate glucose levels will improve Outcome: Progressing   Problem: Nutritional: Goal: Maintenance of adequate nutrition will improve Outcome: Progressing Goal: Progress toward achieving an optimal weight will improve Outcome: Progressing   Problem: Skin Integrity: Goal: Risk for impaired skin integrity will decrease Outcome: Progressing   Problem: Tissue Perfusion: Goal: Adequacy of tissue perfusion will improve Outcome: Progressing   Problem: Education: Goal: Knowledge of General Education information will improve Description: Including pain rating scale, medication(s)/side effects and non-pharmacologic comfort measures Outcome: Progressing   Problem: Health Behavior/Discharge Planning: Goal: Ability to manage health-related needs will improve Outcome: Progressing   Problem: Clinical Measurements: Goal: Ability to maintain clinical measurements within normal limits will improve Outcome: Progressing Goal: Will remain free from infection Outcome: Progressing Goal: Diagnostic test results will improve Outcome: Progressing Goal: Respiratory complications will improve Outcome: Progressing Goal: Cardiovascular complication will  be avoided Outcome: Progressing   Problem: Activity: Goal: Risk for activity intolerance will decrease Outcome: Progressing   Problem: Nutrition: Goal: Adequate nutrition will be maintained Outcome: Progressing   Problem: Coping: Goal: Level of anxiety will decrease Outcome: Progressing   Problem: Elimination: Goal: Will not experience complications related to bowel motility Outcome: Progressing Goal: Will not experience complications related to urinary retention Outcome: Progressing   Problem: Pain Managment: Goal: General experience of comfort will improve and/or be controlled Outcome: Progressing   Problem: Safety: Goal: Ability to remain free from injury will improve Outcome: Progressing   Problem: Skin Integrity: Goal: Risk for impaired skin integrity will decrease Outcome: Progressing   Problem: Education: Goal: Knowledge of risk factors and measures for prevention of condition will improve Outcome: Progressing   Problem: Coping: Goal: Psychosocial and spiritual needs will be supported Outcome: Progressing   Problem: Respiratory: Goal: Will maintain a patent airway Outcome: Progressing Goal: Complications related to the disease process, condition or treatment will be avoided or minimized Outcome: Progressing

## 2024-07-13 NOTE — Plan of Care (Signed)
  Problem: Metabolic: Goal: Ability to maintain appropriate glucose levels will improve Outcome: Progressing   Problem: Nutritional: Goal: Maintenance of adequate nutrition will improve Outcome: Progressing   Problem: Skin Integrity: Goal: Risk for impaired skin integrity will decrease Outcome: Progressing   Problem: Clinical Measurements: Goal: Respiratory complications will improve Outcome: Progressing Goal: Cardiovascular complication will be avoided Outcome: Progressing   Problem: Activity: Goal: Risk for activity intolerance will decrease Outcome: Progressing

## 2024-07-13 NOTE — TOC Progression Note (Addendum)
 Transition of Care [facility]) - Progression Note    Patient Details  Name: Donald [name] MRN: [id number] Date of Birth: November 15, 1945  [facility] CM/SW Contact  [name], LCSW Phone Number: 07/13/2024, 9:19 AM  Clinical Narrative:   [facility] admissions coordinator will review referral. Left message for [facility] admissions coordinator asking her to review.  9:30 pm: [facility] is not in network with [facility].  12:58 pm: [facility] is full right now. CSW called wife to update. She's not sure that she can take him home with how well he is doing right now. CSW told her that [facility] and [facility] have offered a bed. [facility] has not responded yet. Will send the referral to the other local facilities as well.  Expected Discharge Plan: Home/Self Care Barriers to Discharge: Continued Medical Work up               Expected Discharge Plan and Services     Post Acute Care Choice: NA Living arrangements for the past 2 months: Single Family Home                                       Social Drivers of Health (SDOH) Interventions SDOH Screenings   Food Insecurity: No Food Insecurity (07/04/2024)  Housing: Low Risk  (07/04/2024)  Transportation Needs: No Transportation Needs (07/04/2024)  Utilities: Not At Risk (07/04/2024)  Alcohol Screen: Low Risk  (08/20/2023)  Depression (PHQ2-9): Low Risk  (08/20/2023)  Financial Resource Strain: Low Risk  (12/19/2023)   Received from [facility]  Physical Activity: Inactive (08/20/2023)  Social Connections: Moderately Integrated (07/04/2024)  Stress: No Stress Concern Present (08/20/2023)  Tobacco Use: Medium Risk (07/03/2024)  Health Literacy: Adequate Health Literacy (08/20/2023)    Readmission Risk Interventions     No data to display

## 2024-07-13 NOTE — Progress Notes (Signed)
  Progress Note   Patient: Donald [name] FMW:[id number] DOB: Jan 22, 1946 DOA: 07/03/2024     9 DOS: the patient was seen and examined on 07/13/2024   Brief hospital course: 78 y.o. Caucasian male with medical history significant for type 2 diabetes mellitus, dyslipidemia, rheumatoid arthritis and aortic stenosis, presented with acute onset of generalized weakness.  Patient admitted for sepsis due to COVID infection.  Hospital course complicated by A-fib with RVR, seen by cardiology. Hospital course as below  Assessment and Plan: Atrial fibrillation with rapid ventricular response - On metoprolol succinate 50 mg twice daily, Eliquis.  Aspirin was discontinued - on amiodarone 400mg  bid x 7 days, followed by 200 mg bid x 7 days, then 200 mg daily - Seen by Cardiology, appreciate recs   Non-STEMI Troponin peaked at 1099 EKG with new left bundle branch block, now with depressed ejection fraction 35% Outpatient ischemic workup per cardiology  HFrEF Echo with EF 35 to 40%, G1 DD, severe AS hypervolemic on exam, BNP elevated, on metoprolol IV Lasix 40mg  x 1 dose  Symptomatic COVID infection with Hypoxia Fever 102.4 and hypoxia (needing 4 liter O2 via n.c.) on 10/26-chest x-ray on 10/26 concerning for pneumonia S/p Remdesivir and completed antibiotic course  Acute metabolic encephalopathy - resolved - Suspect secondary to pneumonia, underlying dementia, hospital acquired delirium - Delirium precautions, has waxing and waning episodes  RUE pain - US  neg for DVT, superficial thrombophlebitis - Supportive care  Dyslipidemia On Crestor   Rheumatoid arthritis (HCC) Hold Arava due to ongoing infection concerns  Controlled type 2 diabetes mellitus without complication, without long-term current use of insulin (HCC) Hemoglobin A1c low at 5.7.   continue sliding scale insulin  Dementia without behavioral disturbance (HCC) On Exelon  Vitamin B12 deficiency Continue vitamin B12  Severe  aortic stenosis - Follow-up with primary cardiology in [place], for possible TAVR work up  [facility] Palliative care seen   Subjective: Patient examined at the bedside, comfortably sitting in the bed,  Denies any symptoms today. Wife present at the bedside.  Will give 1 dose of IV Lasix for mild crackles. Medically ready, pending SNF placement  Physical Exam: Vitals:   07/13/24 0406 07/13/24 0424 07/13/24 0836 07/13/24 1302  BP: (!) 141/80  (!) 153/99 97/61  Pulse: 69  70 72  Resp: 18     Temp: (!) 97.3 F (36.3 C)  98.3 F (36.8 C) 98.3 F (36.8 C)  TempSrc:   Oral   SpO2: 95%  98% 90%  Weight:  77.5 kg    Height:       Physical Exam Cardiovascular:     Rate and Rhythm: Rhythm irregularly irregular.     Heart sounds: Normal heart sounds, S1 normal and S2 normal.  Pulmonary:     Breath sounds: No decreased breath sounds, wheezing or rales.  Abdominal:     Palpations: Abdomen is soft.     Tenderness: There is no abdominal tenderness.  Musculoskeletal:     Right lower leg: No swelling.     Left lower leg: No swelling.  Skin:    General: Skin is warm.     Findings: No rash.  Neurological:     Mental Status: Awake, alert, confused    Family Communication: Wife at bedside  Disposition: Status is: Inpatient Medically ready  Planned Discharge Destination: SNF    Time spent: 35 minutes  Author: Laree Lock, MD 07/13/2024 2:54 PM  For on call review [email or web address].

## 2024-07-13 NOTE — Progress Notes (Signed)
 Occupational Therapy Treatment Patient Details Name: Donald [name] MRN: [id number] DOB: 1946-07-27 Today's Date: 07/13/2024   History of present illness Pt is a 78 y/o M admitted on 07/03/24 after presenting with acute onset of generalized weakness. Pt tested + for COVID. Pt also being treated for a-fib with RVR, NSTEMI. PMH: DM2, dyslipidemia, RA, aortic stenosis, dementia   OT comments  Pt seen for OT tx. Pt received slouched down in recliner requiring MOD A to help reposition for safety prior to standing. Pt required increased assist for STS and step pivot with RW to the bed. Pt endorsing fatigue. Followed simple commands with cues. Will continue to progress as able.       If plan is discharge home, recommend the following:  Help with stairs or ramp for entrance;Assistance with cooking/housework;Assist for transportation;A lot of help with bathing/dressing/bathroom;A lot of help with walking and/or transfers   Equipment Recommendations  BSC/3in1;Other (comment) (RW)    Recommendations for Other Services      Precautions / Restrictions Precautions Precautions: Fall Recall of Precautions/Restrictions: Impaired Restrictions Weight Bearing Restrictions Per Provider Order: No       Mobility Bed Mobility Overal bed mobility: Needs Assistance Bed Mobility: Sit to Supine       Sit to supine: Mod assist, HOB elevated, Used rails        Transfers Overall transfer level: Needs assistance Equipment used: Rolling walker (2 wheels) Transfers: Sit to/from Stand Sit to Stand: Max assist, Mod assist     Step pivot transfers: Mod assist     General transfer comment: VC sequencing     Balance Overall balance assessment: Needs assistance Sitting-balance support: Feet supported, No upper extremity supported Sitting balance-Leahy Scale: Poor Sitting balance - Comments: heavy posterior lean Postural control: Posterior lean Standing balance support: Bilateral upper extremity  supported, During functional activity Standing balance-Leahy Scale: Poor                             ADL either performed or assessed with clinical judgement   ADL                                              Extremity/Trunk Assessment              Vision       Perception     Praxis     Communication Communication Communication: Impaired Factors Affecting Communication: Reduced clarity of speech   Cognition Arousal: Alert Behavior During Therapy: WFL for tasks assessed/performed Cognition: History of cognitive impairments, Cognition impaired   Orientation impairments: Situation, Place Awareness: Intellectual awareness impaired, Online awareness impaired Memory impairment (select all impairments): Short-term memory, Working civil service fast streamer, Conservation officer, historic buildings, Non-declarative long-term memory Attention impairment (select first level of impairment): Focused attention Executive functioning impairment (select all impairments): Initiation, Organization, Sequencing, Reasoning, Problem solving OT - Cognition Comments: decreased safety awareness and awareness of deficits, impaired executive functioning                 Following commands: Impaired Following commands impaired: Follows one step commands with increased time, Follows multi-step commands inconsistently      Cueing   Cueing Techniques: Verbal cues, Tactile cues, Visual cues  Exercises      Shoulder Instructions       General Comments  Pertinent Vitals/ Pain       Pain Assessment Pain Assessment: No/denies pain  Home Living                                          Prior Functioning/Environment              Frequency  Min 2X/week        Progress Toward Goals  OT Goals(current goals can now be found in the care plan section)  Progress towards OT goals: Progressing toward goals  Acute Rehab OT Goals Patient Stated Goal: improve  strength OT Goal Formulation: With patient/family Time For Goal Achievement: 07/20/24 Potential to Achieve Goals: Good  Plan      Co-evaluation                 AM-PAC OT 6 Clicks Daily Activity     Outcome Measure   Help from another person eating meals?: A Little Help from another person taking care of personal grooming?: A Little Help from another person toileting, which includes using toliet, bedpan, or urinal?: A Lot Help from another person bathing (including washing, rinsing, drying)?: A Lot Help from another person to put on and taking off regular upper body clothing?: A Little Help from another person to put on and taking off regular lower body clothing?: A Lot 6 Click Score: 15    End of Session Equipment Utilized During Treatment: Rolling walker (2 wheels)  OT Visit Diagnosis: Other abnormalities of gait and mobility (R26.89);Muscle weakness (generalized) (M62.81)   Activity Tolerance Patient tolerated treatment well   Patient Left in bed;with call bell/phone within reach;with bed alarm set   Nurse Communication Mobility status        Time: 8557-8542 OT Time Calculation (min): 15 min  Charges: OT General Charges $OT Visit: 1 Visit OT Treatments $Therapeutic Activity: 8-22 mins  Warren SAUNDERS., MPH, MS, OTR/L ascom [phone] 07/13/24, 4:07 PM

## 2024-07-13 NOTE — Progress Notes (Signed)
 MD aware to evening BP (diastolic). No new orders at this time. No signs of distress.

## 2024-07-13 NOTE — Progress Notes (Signed)
 Physical Therapy Treatment Patient Details Name: Donald [name] MRN: [id number] DOB: [birth year] Today's Date: 07/13/2024   History of Present Illness Pt is a 78 y/o M admitted on 07/03/24 after presenting with acute onset of generalized weakness. Pt tested + for COVID. Pt also being treated for a-fib with RVR, NSTEMI. PMH: DM2, dyslipidemia, RA, aortic stenosis, dementia    PT Comments  Patient seen for PT session focused on OOB mobility and functional standing and walking. Patient required min/modA for transfers with increased vc needed for anterior weight shift and used RW. Pt able to ambulate 10 feet to chair after x6 sit to stands with emphasis on balance and anterior weight shift. Tolerated session well with no signs of exertion or distress. Vitals remained stable during activity. Main limiting factors today were impaired ability to follow commands and notable kinesiophobia secondary to fear of falling. Interventions aimed at improving standing and ambulation. Patient shows good potential to make progress with continued acute level rehab. Patient continues to demonstrate moderate activity restrictions and poor tolerance for progressive mobility. Continued skilled PT recommended to progress toward functional goals and support discharge readiness. Pt making good progress toward goals, will continue to follow POC. Discharge recommendation remains appropriate     If plan is discharge home, recommend the following: A lot of help with walking and/or transfers;A little help with bathing/dressing/bathroom;Assist for transportation;Supervision due to cognitive status   Can travel by private vehicle     No  Equipment Recommendations  Rolling walker (2 wheels)    Recommendations for Other Services Rehab consult;OT consult     Precautions / Restrictions Precautions Precautions: Fall Recall of Precautions/Restrictions: Impaired Restrictions Weight Bearing Restrictions Per Provider Order: No      Mobility  Bed Mobility Overal bed mobility: Needs Assistance Bed Mobility: Rolling, Sidelying to Sit Rolling: Mod assist, Used rails Sidelying to sit: Mod assist, HOB elevated, Used rails   Sit to supine: Mod assist, HOB elevated, Used rails   General bed mobility comments: several vc to sit EOB physical assistance provided to legs and arms/hands    Transfers Overall transfer level: Needs assistance Equipment used: Rolling walker (2 wheels), 1 person hand held assist Transfers: Sit to/from Stand Sit to Stand: Min assist, Mod assist, Contact guard assist           General transfer comment: vc for anterior weight shift; physical assistance provided to prevent posterior lean; sit to stand x 6 to improve quality of movement ModA improved to minA/CGA    Ambulation/Gait Ambulation/Gait assistance: Min assist, +2 safety/equipment Gait Distance (Feet): 10 Feet Assistive device: Rolling walker (2 wheels) Gait Pattern/deviations: Decreased step length - right, Decreased step length - left, Shuffle, Narrow base of support Gait velocity: decreased     General Gait Details: lateral steping at EOB before ambulation bout   Stairs             Wheelchair Mobility     Tilt Bed    Modified Rankin (Stroke Patients Only)       Balance Overall balance assessment: Needs assistance Sitting-balance support: Feet supported, No upper extremity supported Sitting balance-Leahy Scale: Fair Sitting balance - Comments: Able to maintain sitting balance at EOB with CGA-SBA for safety. Postural control: Posterior lean Standing balance support: Bilateral upper extremity supported, During functional activity Standing balance-Leahy Scale: Fair Standing balance comment: HHA x2/Min A x2 for all standing activities  Communication Communication Communication: Impaired Factors Affecting Communication: Reduced clarity of speech  Cognition Arousal:  Lethargic, Alert Behavior During Therapy: WFL for tasks assessed/performed   PT - Cognitive impairments: History of cognitive impairments                       PT - Cognition Comments: More conversational. Agreeable to PT sessoin. Following commands: Impaired Following commands impaired: Follows one step commands with increased time, Follows multi-step commands inconsistently    Cueing Cueing Techniques: Verbal cues, Tactile cues, Visual cues  Exercises      General Comments        Pertinent Vitals/Pain Pain Assessment Pain Assessment: No/denies pain    Home Living                          Prior Function            PT Goals (current goals can now be found in the care plan section) Acute Rehab PT Goals Patient Stated Goal: get better PT Goal Formulation: With patient Time For Goal Achievement: 07/20/24 Progress towards PT goals: Progressing toward goals    Frequency    Min 3X/week      PT Plan      Co-evaluation              AM-PAC PT 6 Clicks Mobility   Outcome Measure  Help needed turning from your back to your side while in a flat bed without using bedrails?: A Little Help needed moving from lying on your back to sitting on the side of a flat bed without using bedrails?: A Lot Help needed moving to and from a bed to a chair (including a wheelchair)?: A Lot Help needed standing up from a chair using your arms (e.g., wheelchair or bedside chair)?: A Little Help needed to walk in hospital room?: A Lot Help needed climbing 3-5 steps with a railing? : A Lot 6 Click Score: 14    End of Session Equipment Utilized During Treatment: Gait belt Activity Tolerance: Patient tolerated treatment well Patient left: in chair;with family/visitor present Nurse Communication: Mobility status PT Visit Diagnosis: Unsteadiness on feet (R26.81);Muscle weakness (generalized) (M62.81);Difficulty in walking, not elsewhere classified (R26.2)      Time: 1130-1157 PT Time Calculation (min) (ACUTE ONLY): 27 min  Charges:    $Therapeutic Activity: 23-37 mins PT General Charges $$ ACUTE PT VISIT: 1 Visit                     Sherlean Lesches DPT, PT     Sherlean A Tysheem Accardo 07/13/2024, 12:16 PM

## 2024-07-14 DIAGNOSIS — I4891 Unspecified atrial fibrillation: Secondary | ICD-10-CM | POA: Diagnosis not present

## 2024-07-14 LAB — GLUCOSE, CAPILLARY
Glucose-Capillary: 117 mg/dL — ABNORMAL HIGH (ref 70–99)
Glucose-Capillary: 136 mg/dL — ABNORMAL HIGH (ref 70–99)
Glucose-Capillary: 135 mg/dL — ABNORMAL HIGH (ref 70–99)
Glucose-Capillary: 156 mg/dL — ABNORMAL HIGH (ref 70–99)

## 2024-07-14 LAB — CBC
HCT: 34.6 % — ABNORMAL LOW (ref 39.0–52.0)
Hemoglobin: 12.4 g/dL — ABNORMAL LOW (ref 13.0–17.0)
MCH: 33.7 pg (ref 26.0–34.0)
MCHC: 35.8 g/dL (ref 30.0–36.0)
MCV: 94 fL (ref 80.0–100.0)
Platelets: 275 K/uL (ref 150–400)
RBC: 3.68 MIL/uL — ABNORMAL LOW (ref 4.22–5.81)
RDW: 11.6 % (ref 11.5–15.5)
WBC: 7 K/uL (ref 4.0–10.5)
nRBC: 0 % (ref 0.0–0.2)

## 2024-07-14 LAB — BASIC METABOLIC PANEL WITH GFR
Anion gap: 10 (ref 5–15)
BUN: 21 mg/dL (ref 8–23)
CO2: 24 mmol/L (ref 22–32)
Calcium: 8.2 mg/dL — ABNORMAL LOW (ref 8.9–10.3)
Chloride: 100 mmol/L (ref 98–111)
Creatinine, Ser: 1.06 mg/dL (ref 0.61–1.24)
GFR, Estimated: >60 mL/min (ref >60)
Glucose, Bld: 118 mg/dL — ABNORMAL HIGH (ref 70–99)
Potassium: 3.7 mmol/L (ref 3.5–5.1)
Sodium: 134 mmol/L — ABNORMAL LOW (ref 135–145)

## 2024-07-14 MED ORDER — FUROSEMIDE 10 MG/ML IJ SOLN
40.0000 mg | Freq: Once | INTRAMUSCULAR | Status: AC
  Administered 2024-07-14: 40 mg via INTRAVENOUS
  Filled 2024-07-14: qty 4

## 2024-07-14 NOTE — Progress Notes (Signed)
 Discussed diastolic BP with provider. No new orders at this time. Patient is asymptomatic with no distress.

## 2024-07-14 NOTE — TOC Progression Note (Addendum)
 Transition of Care [facility]) - Progression Note    Patient Details  Name: Donald [name] MRN: [id number] Date of Birth: [birth year]  [facility] CM/SW Contact  Racheal LITTIE Schimke, RN Phone Number: 07/14/2024, 10:40 AM  Clinical Narrative: Spoke with Therisa from [facility] [employer], who confirmed bed offer. Called wife [name] who consents. Ins. Auth process started.  2:40 pm Ins Auth Approved AuthID: [id number] Dates:11/4-11/02/2024. Left voice message for [facility] Therisa says patient can admit tomorrow, 07/15/24. Attempted to call wife to give follow up, no answer left message to return call.  Expected Discharge Plan: Home/Self Care Barriers to Discharge: Continued Medical Work up               Expected Discharge Plan and Services     Post Acute Care Choice: NA Living arrangements for the past 2 months: Single Family Home                                       Social Drivers of Health (SDOH) Interventions SDOH Screenings   Food Insecurity: No Food Insecurity (07/04/2024)  Housing: Low Risk  (07/04/2024)  Transportation Needs: No Transportation Needs (07/04/2024)  Utilities: Not At Risk (07/04/2024)  Alcohol Screen: Low Risk  (08/20/2023)  Depression (PHQ2-9): Low Risk  (08/20/2023)  Financial Resource Strain: Low Risk  (12/19/2023)   Received from [facility]  Physical Activity: Inactive (08/20/2023)  Social Connections: Moderately Integrated (07/04/2024)  Stress: No Stress Concern Present (08/20/2023)  Tobacco Use: Medium Risk (07/03/2024)  Health Literacy: Adequate Health Literacy (08/20/2023)    Readmission Risk Interventions     No data to display

## 2024-07-14 NOTE — Plan of Care (Signed)
  Problem: Education: Goal: Ability to describe self-care measures that may prevent or decrease complications (Diabetes Survival Skills Education) will improve Outcome: Progressing Goal: Individualized Educational Video(s) Outcome: Progressing   Problem: Coping: Goal: Ability to adjust to condition or change in health will improve Outcome: Progressing   Problem: Fluid Volume: Goal: Ability to maintain a balanced intake and output will improve Outcome: Progressing   Problem: Health Behavior/Discharge Planning: Goal: Ability to identify and utilize available resources and services will improve Outcome: Progressing Goal: Ability to manage health-related needs will improve Outcome: Progressing   Problem: Metabolic: Goal: Ability to maintain appropriate glucose levels will improve Outcome: Progressing   Problem: Nutritional: Goal: Maintenance of adequate nutrition will improve Outcome: Progressing Goal: Progress toward achieving an optimal weight will improve Outcome: Progressing   Problem: Skin Integrity: Goal: Risk for impaired skin integrity will decrease Outcome: Progressing   Problem: Tissue Perfusion: Goal: Adequacy of tissue perfusion will improve Outcome: Progressing   Problem: Education: Goal: Knowledge of General Education information will improve Description: Including pain rating scale, medication(s)/side effects and non-pharmacologic comfort measures Outcome: Progressing   Problem: Health Behavior/Discharge Planning: Goal: Ability to manage health-related needs will improve Outcome: Progressing   Problem: Clinical Measurements: Goal: Ability to maintain clinical measurements within normal limits will improve Outcome: Progressing Goal: Will remain free from infection Outcome: Progressing Goal: Diagnostic test results will improve Outcome: Progressing Goal: Respiratory complications will improve Outcome: Progressing Goal: Cardiovascular complication will  be avoided Outcome: Progressing   Problem: Activity: Goal: Risk for activity intolerance will decrease Outcome: Progressing   Problem: Nutrition: Goal: Adequate nutrition will be maintained Outcome: Progressing   Problem: Coping: Goal: Level of anxiety will decrease Outcome: Progressing   Problem: Elimination: Goal: Will not experience complications related to bowel motility Outcome: Progressing Goal: Will not experience complications related to urinary retention Outcome: Progressing   Problem: Pain Managment: Goal: General experience of comfort will improve and/or be controlled Outcome: Progressing   Problem: Safety: Goal: Ability to remain free from injury will improve Outcome: Progressing   Problem: Skin Integrity: Goal: Risk for impaired skin integrity will decrease Outcome: Progressing   Problem: Education: Goal: Knowledge of risk factors and measures for prevention of condition will improve Outcome: Progressing   Problem: Coping: Goal: Psychosocial and spiritual needs will be supported Outcome: Progressing   Problem: Respiratory: Goal: Will maintain a patent airway Outcome: Progressing Goal: Complications related to the disease process, condition or treatment will be avoided or minimized Outcome: Progressing

## 2024-07-14 NOTE — Progress Notes (Signed)
 Physical Therapy Treatment Patient Details Name: Donald [name] MRN: [id number] DOB: 10/08/1945 Today's Date: 07/14/2024   History of Present Illness Pt is a 78 y/o M admitted on 07/03/24 after presenting with acute onset of generalized weakness. Pt tested + for COVID. Pt also being treated for a-fib with RVR, NSTEMI. PMH: DM2, dyslipidemia, RA, aortic stenosis, dementia    PT Comments   Pt was asleep in bed with HOB tilted. Pt stated that he didn't know which month it was. Pt also stated that he knew his level of ambulation is nowhere near the same and BLE are weak compared his baseline. Spoke with pt's spouse after treatment and it was confirmed that there was evidence of dementia before being admitted to the hospital and that cognition is not yet back to baseline. Pt performed supine to sit transfer with Mod A and use of chuck. Posterior lean present when near EOB. Ambulated 20 ft with RW. Provided Min A but progressed to Mod A as pt BLE became fatigued/weak. Discharge recs still appropriate. Continue acute PT.   If plan is discharge home, recommend the following: A lot of help with walking and/or transfers;A little help with bathing/dressing/bathroom;Assist for transportation;Supervision due to cognitive status   Can travel by private vehicle        Equipment Recommendations  Rolling walker (2 wheels)    Recommendations for Other Services       Precautions / Restrictions Precautions Precautions: Fall Recall of Precautions/Restrictions: Impaired Restrictions Weight Bearing Restrictions Per Provider Order: No     Mobility  Bed Mobility Overal bed mobility: Needs Assistance Bed Mobility: Sit to Supine Rolling: Mod assist, Used rails Sidelying to sit: Mod assist, HOB elevated, Used rails Supine to sit: Mod assist, HOB elevated, Used rails Sit to supine: Mod assist, HOB elevated, Used rails   General bed mobility comments: Mod assist used for transfers. Used chuck to slide patient  to EOB and towards top of bed    Transfers Overall transfer level: Needs assistance Equipment used: Rolling walker (2 wheels) Transfers: Sit to/from Stand Sit to Stand: Max assist, Mod assist           General transfer comment: Needed lots of verbal and tactile cueing.    Ambulation/Gait Ambulation/Gait assistance: Min assist, Mod assist, +2 safety/equipment Gait Distance (Feet): 20 Feet Assistive device: Rolling walker (2 wheels) Gait Pattern/deviations: Decreased step length - right, Decreased step length - left, Shuffle, Trunk flexed       General Gait Details: Pt started with Min A but progressed to Mod A as pt fatigued. Vitals stayed WNL       Balance Overall balance assessment: Needs assistance Sitting-balance support: Feet supported, No upper extremity supported Sitting balance-Leahy Scale: Poor Sitting balance - Comments: Seemed hesitant with balance when moving towards EOB Postural control: Posterior lean Standing balance support: Bilateral upper extremity supported, During functional activity Standing balance-Leahy Scale: Poor          Communication Communication Communication: Impaired Factors Affecting Communication: Reduced clarity of speech;Difficulty expressing self  Cognition Arousal: Lethargic Behavior During Therapy: Flat affect   PT - Cognitive impairments: History of cognitive impairments           PT - Cognition Comments: Agreeable to treatment and exercise. Couldn't recall certain statements he should be able to (number of children and month) Following commands: Impaired Following commands impaired: Follows one step commands with increased time, Follows multi-step commands inconsistently    Cueing Cueing Techniques: Verbal cues, Tactile cues,  Visual cues  Exercises      General Comments General comments (skin integrity, edema, etc.): BP measured as 112/61 (77), HR 59, SpO2 95%.      Pertinent Vitals/Pain Pain Assessment Pain  Assessment: No/denies pain Pain Score: 0-No pain Pain Intervention(s): Limited activity within patient's tolerance, Monitored during session, Repositioned     PT Goals (current goals can now be found in the care plan section) Acute Rehab PT Goals Patient Stated Goal: get better Progress towards PT goals: Progressing toward goals    Frequency    Min 3X/week      PT Plan      Co-evaluation     PT goals addressed during session: Mobility/safety with mobility;Balance;Proper use of DME;Strengthening/ROM        AM-PAC PT 6 Clicks Mobility   Outcome Measure  Help needed turning from your back to your side while in a flat bed without using bedrails?: A Little Help needed moving from lying on your back to sitting on the side of a flat bed without using bedrails?: A Lot Help needed moving to and from a bed to a chair (including a wheelchair)?: A Lot Help needed standing up from a chair using your arms (e.g., wheelchair or bedside chair)?: A Lot Help needed to walk in hospital room?: A Lot Help needed climbing 3-5 steps with a railing? : A Lot 6 Click Score: 13    End of Session Equipment Utilized During Treatment: Gait belt Activity Tolerance: Patient limited by lethargy Patient left: in bed;with call bell/phone within reach;with bed alarm set Nurse Communication: Mobility status PT Visit Diagnosis: Unsteadiness on feet (R26.81);Muscle weakness (generalized) (M62.81);Difficulty in walking, not elsewhere classified (R26.2)     Time: 8646-8588 PT Time Calculation (min) (ACUTE ONLY): 18 min  Charges:    $Gait Training: 8-22 mins PT General Charges $$ ACUTE PT VISIT: 1 Visit                     Signe Jaycen Vercher SPTA    Birt Reinoso 07/14/2024, 3:36 PM

## 2024-07-14 NOTE — Progress Notes (Addendum)
 Progress Note   Patient: Donald [name] FMW:[id number] DOB: 1946-09-02 DOA: 07/03/2024     10 DOS: the patient was seen and examined on 07/14/2024   Brief hospital course: 78 y.o. Caucasian male with medical history significant for type 2 diabetes mellitus, dyslipidemia, rheumatoid arthritis and aortic stenosis, presented with acute onset of generalized weakness.  Patient admitted for sepsis due to COVID infection.  Hospital course complicated by A-fib with RVR, seen by cardiology. Hospital course as below  Assessment and Plan: Atrial fibrillation with rapid ventricular response - On metoprolol succinate 50 mg twice daily, Eliquis.  Aspirin was discontinued - on amiodarone 400mg  bid x 7 days, followed by 200 mg bid x 7 days, then 200 mg daily - Seen by Cardiology, appreciate recs   Non-STEMI Troponin peaked at 1099 EKG with new left bundle branch block, now with depressed ejection fraction 35% Outpatient ischemic workup per cardiology  HFrEF Echo with EF 35 to 40%, G1 DD, severe AS Received IV fluids and multiple IV infusions hypervolemic on exam, BNP elevated, on metoprolol IV lasix x 1 dose 11/04  Symptomatic COVID infection with Hypoxia Fever 102.4 and hypoxia (needing 4 liter O2 via n.c.) on 10/26-chest x-ray on 10/26 concerning for pneumonia S/p Remdesivir and completed antibiotic course  Acute metabolic encephalopathy - resolved - Suspect secondary to pneumonia, underlying dementia, hospital acquired delirium - Delirium precautions, has waxing and waning episodes  RUE pain - US  neg for DVT, superficial thrombophlebitis - Supportive care  Dyslipidemia On Crestor   Rheumatoid arthritis (HCC) Hold Arava due to ongoing infection concerns  Controlled type 2 diabetes mellitus without complication, without long-term current use of insulin (HCC) Hemoglobin A1c low at 5.7.   continue sliding scale insulin  Dementia without behavioral disturbance (HCC) On Exelon  Vitamin  B12 deficiency Continue vitamin B12  Severe aortic stenosis - Follow-up with primary cardiology in [place], for possible TAVR work up  [facility] Palliative care seen   Subjective: Patient examined at the bedside, comfortably sitting in the bed,  Denies any symptoms today. Wife present at the bedside.  Will give 1 dose of IV Lasix for mild crackles. Medically ready, pending SNF placement  Physical Exam: Vitals:   07/14/24 0500 07/14/24 0758 07/14/24 1158 07/14/24 1528  BP:  136/63 (!) 121/42 127/69  Pulse:  66 (!) 59 (!) 58  Resp:  15 17 16   Temp:  (!) 97.5 F (36.4 C) 97.8 F (36.6 C) 97.6 F (36.4 C)  TempSrc:      SpO2:  94% 95% 95%  Weight: 75 kg     Height:       Physical Exam Cardiovascular:     Rate and Rhythm: Rhythm irregularly irregular.     Heart sounds: Normal heart sounds, S1 normal and S2 normal.  Pulmonary:     Breath sounds: No decreased breath sounds, wheezing or rales.  Abdominal:     Palpations: Abdomen is soft.     Tenderness: There is no abdominal tenderness.  Musculoskeletal:     Right lower leg: No swelling.     Left lower leg: No swelling.  Skin:    General: Skin is warm.     Findings: No rash.  Neurological:     Mental Status: Awake, alert, confused    Family Communication: Wife at bedside  Disposition: Status is: Inpatient Medically ready  Planned Discharge Destination: SNF    Time spent: 35 minutes  Author: Laree Lock, MD 07/14/2024 6:28 PM  For on call review [email or web address].

## 2024-07-14 NOTE — Progress Notes (Signed)
 Okay to d/c tele monitoring per MD.

## 2024-07-15 DIAGNOSIS — I361 Nonrheumatic tricuspid (valve) insufficiency: Secondary | ICD-10-CM | POA: Diagnosis not present

## 2024-07-15 DIAGNOSIS — I4891 Unspecified atrial fibrillation: Secondary | ICD-10-CM | POA: Diagnosis not present

## 2024-07-15 LAB — GLUCOSE, CAPILLARY
Glucose-Capillary: 120 mg/dL — ABNORMAL HIGH (ref 70–99)
Glucose-Capillary: 172 mg/dL — ABNORMAL HIGH (ref 70–99)

## 2024-07-15 LAB — BASIC METABOLIC PANEL WITH GFR
Anion gap: 11 (ref 5–15)
BUN: 24 mg/dL — ABNORMAL HIGH (ref 8–23)
CO2: 24 mmol/L (ref 22–32)
Calcium: 8.2 mg/dL — ABNORMAL LOW (ref 8.9–10.3)
Chloride: 102 mmol/L (ref 98–111)
Creatinine, Ser: 1.16 mg/dL (ref 0.61–1.24)
GFR, Estimated: >60 mL/min (ref >60)
Glucose, Bld: 106 mg/dL — ABNORMAL HIGH (ref 70–99)
Potassium: 3.7 mmol/L (ref 3.5–5.1)
Sodium: 137 mmol/L (ref 135–145)

## 2024-07-15 LAB — MAGNESIUM: Magnesium: 2.2 mg/dL (ref 1.7–2.4)

## 2024-07-15 MED ORDER — APIXABAN 5 MG PO TABS: 5.0000 mg | ORAL_TABLET | Freq: Two times a day (BID) | ORAL | Status: DC

## 2024-07-15 MED ORDER — AMIODARONE HCL 200 MG PO TABS: ORAL_TABLET | ORAL | Status: AC

## 2024-07-15 MED ORDER — METOPROLOL SUCCINATE ER 50 MG PO TB24: 50.0000 mg | ORAL_TABLET | Freq: Two times a day (BID) | ORAL | Status: DC

## 2024-07-15 MED ORDER — TRAZODONE HCL 50 MG PO TABS: 25.0000 mg | ORAL_TABLET | Freq: Every evening | ORAL | Status: DC | PRN

## 2024-07-15 NOTE — Plan of Care (Signed)
  Problem: Coping: Goal: Ability to adjust to condition or change in health will improve Outcome: Progressing   Problem: Health Behavior/Discharge Planning: Goal: Ability to identify and utilize available resources and services will improve Outcome: Progressing Goal: Ability to manage health-related needs will improve Outcome: Progressing   Problem: Metabolic: Goal: Ability to maintain appropriate glucose levels will improve Outcome: Progressing   Problem: Clinical Measurements: Goal: Diagnostic test results will improve Outcome: Progressing Goal: Respiratory complications will improve Outcome: Progressing Goal: Cardiovascular complication will be avoided Outcome: Progressing   Problem: Safety: Goal: Ability to remain free from injury will improve Outcome: Progressing

## 2024-07-15 NOTE — TOC Transition Note (Signed)
 Transition of Care [facility]) - Discharge Note   Patient Details  Name: Donald [name] MRN: [id number] Date of Birth: Aug 27, 1946  [facility] CM/SW Contact:  Marinda Cooks, RN Phone Number: 07/15/2024, 12:33 PM   Clinical Narrative:    This CM updated by covering MD pt medically cleared to dc today and has active DC order . This CM spoke with [facility]mission liaison at [facility] and confirmed pt's bed offer and that he can be received today .DC transportation confirmed for pt with [facility] team updated . No additional DC needs requested by medical team or identified by CM at this time .     Final next level of care: Skilled Nursing Facility Barriers to Discharge: SNF Covid    Patient Goals and CMS Choice     Choice offered to / list presented to : Spouse [place] ownership interest in [facility].provided to:: Spouse    Discharge Placement              Patient chooses bed at: [facility] Patient to be transferred to facility by: Ambulance Name of family member notified: Wife Patient and family notified of of transfer: 07/15/24   Social Drivers of Health (SDOH) Interventions SDOH Screenings   Food Insecurity: No Food Insecurity (07/04/2024)  Housing: Low Risk  (07/04/2024)  Transportation Needs: No Transportation Needs (07/04/2024)  Utilities: Not At Risk (07/04/2024)  Alcohol Screen: Low Risk  (08/20/2023)  Depression (PHQ2-9): Low Risk  (08/20/2023)  Financial Resource Strain: Low Risk  (12/19/2023)   Received from [facility]  Physical Activity: Inactive (08/20/2023)  Social Connections: Moderately Integrated (07/04/2024)  Stress: No Stress Concern Present (08/20/2023)  Tobacco Use: Medium Risk (07/03/2024)  Health Literacy: Adequate Health Literacy (08/20/2023)     Readmission Risk Interventions     No data to display

## 2024-07-15 NOTE — Discharge Summary (Signed)
 Physician Discharge Summary   Patient: Donald [name] MRN: [id number] DOB: April 30, 1946  Admit date:     07/03/2024  Discharge date: 07/15/24  Discharge Physician: Drue ONEIDA Potter   PCP: Glendia Shad, MD   Recommendations at discharge:  Follow-up with PCP  Discharge Diagnoses: Principal Problem:   Atrial fibrillation with rapid ventricular response (HCC) Active Problems:   COVID   Dyslipidemia   Rheumatoid arthritis (HCC)   Controlled type 2 diabetes mellitus without complication, without long-term current use of insulin (HCC)   Acute cough   Vitamin B12 deficiency   Sepsis (HCC)   Tachypnea   Weakness   Dementia without behavioral disturbance (HCC)   Sepsis due to COVID-19 (HCC)   Dilated cardiomyopathy (HCC)   PAF (paroxysmal atrial fibrillation) (HCC)   Non-ST elevation (NSTEMI) myocardial infarction [facility])  Resolved Problems:   * No resolved hospital problems. [facility] Course: 78 y.o. Caucasian male with medical history significant for type 2 diabetes mellitus, dyslipidemia, rheumatoid arthritis and aortic stenosis, presented with acute onset of generalized weakness.  Patient admitted for sepsis due to COVID infection.  Hospital course complicated by A-fib with RVR, seen by cardiology.  Hospital course as outlined below:  Assessment and Plan: Atrial fibrillation with rapid ventricular response - On metoprolol succinate 50 mg twice daily, Eliquis.  Aspirin was discontinued - Completed amiodarone 400 mg twice daily for 7 days.  To continue amiodarone  200 mg bid x 7 days, then 200 mg daily - Patient was seen by cardiologist To follow-up with cardiologist as outpatient   Non-STEMI Troponin peaked at 1099 EKG with new left bundle branch block, now with depressed ejection fraction 35% Outpatient ischemic workup per cardiology   HFrEF Echo with EF 35 to 40%, G1 DD, severe AS Received IV fluids and multiple IV infusions hypervolemic on exam, BNP elevated, on  metoprolol IV lasix x 1 dose 11/04 Currently euvolemic   Symptomatic COVID infection with Hypoxia Fever 102.4 and hypoxia (needing 4 liter O2 via n.c.) on 10/26-chest x-ray on 10/26 concerning for pneumonia S/p Remdesivir and completed antibiotic course Has completed therapy and currently off oxygen   Acute metabolic encephalopathy - resolved - Suspect secondary to pneumonia, underlying dementia, hospital acquired delirium Mental status back to baseline according to patient's wife   RUE pain - US  neg for DVT, superficial thrombophlebitis - Supportive care   Dyslipidemia On Crestor    Rheumatoid arthritis (HCC) Resume Arava on discharge   Controlled type 2 diabetes mellitus without complication, without long-term current use of insulin (HCC) Hemoglobin A1c low at 5.7.   continue sliding scale insulin   Dementia without behavioral disturbance (HCC) On Exelon   Vitamin B12 deficiency Continue vitamin B12   Severe aortic stenosis - Follow-up with primary cardiology in [place], for possible TAVR work up     Consultants: Cardiology Procedures performed: None Disposition: Skilled nursing facility Diet recommendation:  Cardiac diet DISCHARGE MEDICATION: Allergies as of 07/15/2024   No Known Allergies      Medication List     STOP taking these medications    APPLE CIDER VINEGAR PO   aspirin EC 81 MG tablet   CALCIUM  + VITAMIN D3 PO   CoQ10 100 MG Caps   L-Lysine 500 MG Caps   methocarbamol 500 MG tablet Commonly known as: ROBAXIN       TAKE these medications    Accu-Chek Aviva Plus test strip Generic drug: glucose blood TEST TWO TIMES DAILY   Accu-Chek Softclix Lancets lancets  TEST TWO TIMES DAILY   amiodarone 200 MG tablet Commonly known as: PACERONE Take 1 tablet (200 mg total) by mouth 2 (two) times daily for 7 days, THEN 1 tablet (200 mg total) daily. Start taking on: July 16, 2024   apixaban 5 MG Tabs tablet Commonly known as:  ELIQUIS Take 1 tablet (5 mg total) by mouth 2 (two) times daily.   cetirizine 10 MG tablet Commonly known as: ZYRTEC Take 10 mg by mouth daily as needed for allergies.   cyanocobalamin  1000 MCG tablet Commonly known as: VITAMIN B12 Take 1,000 mcg by mouth daily.   leflunomide 10 MG tablet Commonly known as: ARAVA Take 10 mg by mouth daily.   magnesium oxide 400 (240 Mg) MG tablet Commonly known as: MAG-OX Take 400 mg by mouth daily.   metoprolol succinate 50 MG 24 hr tablet Commonly known as: TOPROL-XL Take 1 tablet (50 mg total) by mouth 2 (two) times daily. Take with or immediately following a meal.   Multi-Vitamin tablet Take 1 tablet by mouth daily.   rivastigmine 1.5 MG capsule Commonly known as: EXELON Take 1.5 mg by mouth 2 (two) times daily.   rosuvastatin  10 MG tablet Commonly known as: Crestor  Take 1 tablet (10 mg total) by mouth daily.   traZODone 50 MG tablet Commonly known as: DESYREL Take 0.5 tablets (25 mg total) by mouth at bedtime as needed for sleep.        Contact information for after-discharge care     Destination     [facility] .   Service: Skilled Nursing Contact information: [address] [place]   [address] [phone]                    Discharge Exam: Donald [name]   07/13/24 0424 07/14/24 0500 07/15/24 0442  Weight: 77.5 kg 75 kg 71.8 kg    Rate and Rhythm: Rhythm irregularly irregular.     Heart sounds: Normal heart sounds, S1 normal and S2 normal.  Pulmonary:     Breath sounds: No decreased breath sounds, wheezing or rales.  Abdominal:     Palpations: Abdomen is soft.     Tenderness: There is no abdominal tenderness.  Musculoskeletal:     Right lower leg: No swelling.     Left lower leg: No swelling.  Skin:    General: Skin is warm.     Findings: No rash.  Neurological: Awake and alert mental status at baseline  Condition at  discharge: good  The results of significant diagnostics from this hospitalization (including imaging, microbiology, ancillary and laboratory) are listed below for reference.   Imaging Studies: DG Chest 1 View Result Date: 07/11/2024 CLINICAL DATA:  Pulmonary edema, COVID positive EXAM: CHEST  1 VIEW COMPARISON:  07/08/2024 FINDINGS: Single frontal view of the chest demonstrates a stable cardiac silhouette. There is patchy bilateral perihilar airspace disease, increased since previous exam. Small bilateral pleural effusions, which have also increased since prior study. No pneumothorax. No acute bony abnormalities. IMPRESSION: 1. Progressive patchy bilateral airspace disease and enlarging bilateral pleural effusions, compatible with given history of pulmonary edema. Superimposed atypical infection cannot be excluded given history of COVID-19 positivity. Electronically Signed   By: Ozell Daring M.D.   On: 07/11/2024 12:40   US  Venous Img Upper Uni Right(DVT) Result Date: 07/10/2024 CLINICAL DATA:  Right upper extremity edema. EXAM: RIGHT UPPER EXTREMITY VENOUS DOPPLER ULTRASOUND TECHNIQUE: Gray-scale sonography with graded compression, as well  as color Doppler and duplex ultrasound were performed to evaluate the upper extremity deep venous system from the level of the subclavian vein and including the jugular, axillary, basilic, radial, ulnar and upper cephalic vein. Spectral Doppler was utilized to evaluate flow at rest and with distal augmentation maneuvers. COMPARISON:  None Available. FINDINGS: Contralateral Subclavian Vein: Respiratory phasicity is normal and symmetric with the symptomatic side. No evidence of thrombus. Normal compressibility. Internal Jugular Vein: No evidence of thrombus. Normal compressibility, respiratory phasicity and response to augmentation. Subclavian Vein: No evidence of thrombus. Normal compressibility, respiratory phasicity and response to augmentation. Axillary Vein: No  evidence of thrombus. Normal compressibility, respiratory phasicity and response to augmentation. Cephalic Vein: Nonocclusive thrombus identified the cephalic vein of the upper arm. Basilic Vein: No evidence of thrombus. Normal compressibility, respiratory phasicity and response to augmentation. Brachial Veins: No evidence of thrombus. Normal compressibility, respiratory phasicity and response to augmentation. Radial Veins: No evidence of thrombus. Normal compressibility, respiratory phasicity and response to augmentation. Ulnar Veins: No evidence of thrombus. Normal compressibility, respiratory phasicity and response to augmentation. Venous Reflux:  None visualized. Other Findings:  No abnormal fluid collections. IMPRESSION: No evidence of right upper extremity DVT. Superficial thrombophlebitis of the right cephalic vein with nonocclusive thrombus present. Electronically Signed   By: Marcey Moan M.D.   On: 07/10/2024 20:11   DG Chest 1 View Result Date: 07/08/2024 EXAM: 1 VIEW XRAY OF THE CHEST 07/08/2024 05:22:26 PM COMPARISON: Chest x-ray 07/05/2024. CLINICAL HISTORY: [id number] Pneumonia [id number]. Pneumonia Pneumonia FINDINGS: LUNGS AND PLEURA: There are patchy and streaky perihilar opacities bilaterally similar to prior. There is a small left pleural effusion which is unchanged. No pulmonary edema. No pneumothorax. HEART AND MEDIASTINUM: The heart is mildly enlarged. BONES AND SOFT TISSUES: No acute osseous abnormality. IMPRESSION: 1. Patchy and streaky perihilar opacities bilaterally, similar to prior, consistent with pneumonia. 2. Small left pleural effusion, unchanged. Electronically signed by: Greig Pique MD 07/08/2024 07:49 PM EDT RP Workstation: [id number]   DG Chest Port 1 View Result Date: 07/05/2024 EXAM: 1 VIEW(S) XRAY OF THE CHEST 07/05/2024 07:02:55 PM COMPARISON: 07/03/2024 CLINICAL HISTORY: Fever [id number]. fever. FINDINGS: LUNGS AND PLEURA: New patchy opacities in the lungs bilaterally, lower  lung predominant, slightly favoring interstitial edema over multifocal pneumonia. Small left pleural effusion, new. No pneumothorax. HEART AND MEDIASTINUM: No acute abnormality of the cardiac and mediastinal silhouettes. Thoracic aortic atherosclerosis. BONES AND SOFT TISSUES: No acute osseous abnormality. IMPRESSION: 1. New bilateral lower lung predominant patchy opacities, slightly favoring interstitial edema over multifocal pneumonia. 2. Small left pleural effusion. Electronically signed by: Pinkie Pebbles MD 07/05/2024 07:42 PM EDT RP Workstation: [id number]   ECHOCARDIOGRAM LIMITED Result Date: 07/04/2024    ECHOCARDIOGRAM LIMITED REPORT   Patient Name:   Donald [name] Date of Exam: 07/04/2024 Medical Rec #:  [id number]    Height:       70.0 in Accession #:    [id number]   Weight:       170.0 lb Date of Birth:  May 31, 1946    BSA:          1.948 m Patient Age:    78 years     BP:           146/68 mmHg Patient Gender: M            HR:           90 bpm. Exam Location:  [facility] Procedure: 2D Echo, Cardiac Doppler, Color Doppler and Intracardiac  Opacification Agent (Both Spectral and Color Flow Doppler were            utilized during procedure). Indications:     Heart block, LBBB I44.7, SOB, covid, NSTEMI  History:         Patient has prior history of Echocardiogram examinations. Risk                  Factors:Diabetes.  Sonographer:     Bari Roar Referring Phys:  [id number] CHRISTOPHER RONALD BERGE Diagnosing Phys: Timothy Gollan MD IMPRESSIONS  1. Left ventricular ejection fraction, by estimation, is 35 to 40%. The left ventricle has moderately decreased function. The left ventricle demonstrates global hypokinesis. Left ventricular diastolic parameters are consistent with Grade I diastolic dysfunction (impaired relaxation).  2. Right ventricular systolic function is normal. The right ventricular size is normal.  3. The mitral valve is normal in structure. Mild mitral valve regurgitation. No evidence of  mitral stenosis.  4. The aortic valve is calcified. Aortic valve regurgitation is not visualized. Severe aortic valve stenosis by Aortic valve area, by VTI measures 0.48 cm. Aortic valve mean gradient measures 24.0 mmHg and Aortic valve Vmax measures 3.27 m/s, likely underestimated in setting of cardiomyopathy.  5. The inferior vena cava is normal in size with greater than 50% respiratory variability, suggesting right atrial pressure of 3 mmHg. FINDINGS  Left Ventricle: Left ventricular ejection fraction, by estimation, is 35 to 40%. The left ventricle has moderately decreased function. The left ventricle demonstrates global hypokinesis. Definity contrast agent was given IV to delineate the left ventricular endocardial borders. The left ventricular internal cavity size was normal in size. There is no left ventricular hypertrophy. Left ventricular diastolic parameters are consistent with Grade I diastolic dysfunction (impaired relaxation). Right Ventricle: The right ventricular size is normal. No increase in right ventricular wall thickness. Right ventricular systolic function is normal. Left Atrium: Left atrial size was normal in size. Right Atrium: Right atrial size was normal in size. Pericardium: There is no evidence of pericardial effusion. Mitral Valve: The mitral valve is normal in structure. Mild mitral valve regurgitation. No evidence of mitral valve stenosis. MV peak gradient, 8.9 mmHg. The mean mitral valve gradient is 4.0 mmHg. Tricuspid Valve: The tricuspid valve is normal in structure. Tricuspid valve regurgitation is mild . No evidence of tricuspid stenosis. Aortic Valve: The aortic valve is normal in structure. Aortic valve regurgitation is not visualized. Severe aortic stenosis is present. Aortic valve mean gradient measures 24.0 mmHg. Aortic valve peak gradient measures 42.8 mmHg. Aortic valve area, by VTI measures 0.48 cm. Pulmonic Valve: The pulmonic valve was normal in structure. Pulmonic valve  regurgitation is not visualized. No evidence of pulmonic stenosis. Aorta: The aortic root is normal in size and structure. Venous: The inferior vena cava is normal in size with greater than 50% respiratory variability, suggesting right atrial pressure of 3 mmHg. IAS/Shunts: No atrial level shunt detected by color flow Doppler. Additional Comments: Color Doppler performed.  LEFT VENTRICLE PLAX 2D LVIDd:         4.40 cm     Diastology LVIDs:         3.50 cm     LV e' medial:    5.33 cm/s LV PW:         1.00 cm     LV E/e' medial:  19.3 LV IVS:        1.30 cm     LV e' lateral:   6.20 cm/s LVOT diam:  1.80 cm     LV E/e' lateral: 16.6 LV SV:         31 LV SV Index:   16 LVOT Area:     2.54 cm  LV Volumes (MOD) LV vol d, MOD A2C: 84.3 ml LV vol d, MOD A4C: 76.1 ml LV vol s, MOD A2C: 57.2 ml LV vol s, MOD A4C: 43.5 ml LV SV MOD A2C:     27.1 ml LV SV MOD A4C:     76.1 ml LV SV MOD BP:      30.7 ml RIGHT VENTRICLE RV Basal diam:  2.80 cm RV Mid diam:    2.60 cm RV S prime:     13.40 cm/s TAPSE (M-mode): 2.3 cm LEFT ATRIUM             Index        RIGHT ATRIUM           Index LA diam:        4.00 cm 2.05 cm/m   RA Area:     11.10 cm LA Vol (A2C):   48.3 ml 24.79 ml/m  RA Volume:   22.20 ml  11.40 ml/m LA Vol (A4C):   50.3 ml 25.82 ml/m LA Biplane Vol: 49.4 ml 25.36 ml/m  AORTIC VALVE                     PULMONIC VALVE AV Area (Vmax):    0.55 cm      PV Vmax:        0.95 m/s AV Area (Vmean):   0.53 cm      PV Peak grad:   3.6 mmHg AV Area (VTI):     0.48 cm      RVOT Peak grad: 2 mmHg AV Vmax:           327.00 cm/s AV Vmean:          228.000 cm/s AV VTI:            0.642 m AV Peak Grad:      42.8 mmHg AV Mean Grad:      24.0 mmHg LVOT Vmax:         71.00 cm/s LVOT Vmean:        47.300 cm/s LVOT VTI:          0.120 m LVOT/AV VTI ratio: 0.19  AORTA Ao Root diam: 2.80 cm MITRAL VALVE                TRICUSPID VALVE MV Area (PHT): 7.90 cm     TR Peak grad:   42.0 mmHg MV Area VTI:   1.77 cm     TR Vmax:         324.00 cm/s MV Peak grad:  8.9 mmHg MV Mean grad:  4.0 mmHg     SHUNTS MV Vmax:       1.49 m/s     Systemic VTI:  0.12 m MV Vmean:      89.7 cm/s    Systemic Diam: 1.80 cm MV Decel Time: 96 msec MV E velocity: 103.00 cm/s MV A velocity: 140.00 cm/s MV E/A ratio:  0.74 MV A Prime:    16.6 cm/s Evalene Lunger MD Electronically signed by Evalene Lunger MD Signature Date/Time: 07/04/2024/4:38:39 PM    Final    DG Chest Port 1 View Result Date: 07/03/2024 EXAM: 1 VIEW(S) XRAY OF THE CHEST 07/03/2024 08:11:00 PM COMPARISON: 12/04/2016 CLINICAL HISTORY: Questionable sepsis - evaluate  for abnormality. FINDINGS: LUNGS AND PLEURA: No focal pulmonary opacity. No pulmonary edema. No pleural effusion. No pneumothorax. HEART AND MEDIASTINUM: Aortic atherosclerosis. No acute abnormality of the cardiac and mediastinal silhouettes. BONES AND SOFT TISSUES: No acute osseous abnormality. IMPRESSION: 1. No acute cardiopulmonary process. Electronically signed by: Pinkie Pebbles MD 07/03/2024 08:17 PM EDT RP Workstation: [id number]    Microbiology: Results for orders placed or performed during the hospital encounter of 07/03/24  Resp panel by RT-PCR (RSV, Flu A&B, Covid) Anterior Nasal Swab     Status: Abnormal   Collection Time: 07/03/24  4:29 PM   Specimen: Anterior Nasal Swab  Result Value Ref Range Status   SARS Coronavirus 2 by RT PCR POSITIVE (A) NEGATIVE Final    Comment: (NOTE) SARS-CoV-2 target nucleic acids are DETECTED.  The SARS-CoV-2 RNA is generally detectable in upper respiratory specimens during the acute phase of infection. Positive results are indicative of the presence of the identified virus, but do not rule out bacterial infection or co-infection with other pathogens not detected by the test. Clinical correlation with patient history and other diagnostic information is necessary to determine patient infection status. The expected result is Negative.  Fact Sheet for  Patients: bloggercourse.com  Fact Sheet for Healthcare Providers: seriousbroker.it  This test is not yet approved or cleared by the [place]  FDA and  has been authorized for detection and/or diagnosis of SARS-CoV-2 by FDA under an Emergency Use Authorization (EUA).  This EUA will remain in effect (meaning this test can be used) for the duration of  the COVID-19 declaration under Section 564(b)(1) of the A ct, 21 U.S.C. section 360bbb-3(b)(1), unless the authorization is terminated or revoked sooner.     Influenza A by PCR NEGATIVE NEGATIVE Final   Influenza B by PCR NEGATIVE NEGATIVE Final    Comment: (NOTE) The Xpert Xpress SARS-CoV-2/FLU/RSV plus assay is intended as an aid in the diagnosis of influenza from Nasopharyngeal swab specimens and should not be used as a sole basis for treatment. Nasal washings and aspirates are unacceptable for Xpert Xpress SARS-CoV-2/FLU/RSV testing.  Fact Sheet for Patients: bloggercourse.com  Fact Sheet for Healthcare Providers: seriousbroker.it  This test is not yet approved or cleared by the [place]  FDA and has been authorized for detection and/or diagnosis of SARS-CoV-2 by FDA under an Emergency Use Authorization (EUA). This EUA will remain in effect (meaning this test can be used) for the duration of the COVID-19 declaration under Section 564(b)(1) of the Act, 21 U.S.C. section 360bbb-3(b)(1), unless the authorization is terminated or revoked.     Resp Syncytial Virus by PCR NEGATIVE NEGATIVE Final    Comment: (NOTE) Fact Sheet for Patients: bloggercourse.com  Fact Sheet for Healthcare Providers: seriousbroker.it  This test is not yet approved or cleared by the [place]  FDA and has been authorized for detection and/or diagnosis of SARS-CoV-2 by FDA under an Emergency Use  Authorization (EUA). This EUA will remain in effect (meaning this test can be used) for the duration of the COVID-19 declaration under Section 564(b)(1) of the Act, 21 U.S.C. section 360bbb-3(b)(1), unless the authorization is terminated or revoked.  Performed at [facility], [address] [place], [place] [address]   Blood Culture (routine x 2)     Status: None   Collection Time: 07/03/24  7:51 PM   Specimen: BLOOD RIGHT ARM  Result Value Ref Range Status   Specimen Description BLOOD RIGHT ARM  Final   Special Requests   Final  BOTTLES DRAWN AEROBIC AND ANAEROBIC Blood Culture results may not be optimal due to an inadequate volume of blood received in culture bottles   Culture   Final    NO GROWTH 5 DAYS Performed at [facility], [address] [place], [place] [address]    Report Status 07/08/2024 FINAL  Final  Blood Culture (routine x 2)     Status: None   Collection Time: 07/03/24  8:30 PM   Specimen: BLOOD  Result Value Ref Range Status   Specimen Description BLOOD BLOOD RIGHT HAND  Final   Special Requests   Final    BOTTLES DRAWN AEROBIC AND ANAEROBIC Blood Culture results may not be optimal due to an inadequate volume of blood received in culture bottles   Culture   Final    NO GROWTH 5 DAYS Performed at [facility], [address] [place], [place] [address]    Report Status 07/08/2024 FINAL  Final  Culture, blood (Routine X 2) w Reflex to ID Panel     Status: None   Collection Time: 07/05/24  8:33 PM   Specimen: BLOOD  Result Value Ref Range Status   Specimen Description BLOOD BLOOD LEFT HAND  Final   Special Requests   Final    BOTTLES DRAWN AEROBIC AND ANAEROBIC Blood Culture adequate volume   Culture   Final    NO GROWTH 5 DAYS Performed at [facility], [address] [place], [place] [address]    Report Status 07/10/2024 FINAL  Final  Culture, blood (Routine X 2) w Reflex to ID Panel     Status: None    Collection Time: 07/05/24  8:33 PM   Specimen: BLOOD  Result Value Ref Range Status   Specimen Description BLOOD BLOOD LEFT ARM  Final   Special Requests   Final    BOTTLES DRAWN AEROBIC AND ANAEROBIC Blood Culture adequate volume   Culture   Final    NO GROWTH 5 DAYS Performed at [facility], [address] [place], [place] [address]    Report Status 07/10/2024 FINAL  Final    Labs: CBC: Recent Labs  Lab 07/09/24 0438 07/10/24 0323 07/14/24 0322  WBC 9.4 9.5 7.0  HGB 12.1* 11.7* 12.4*  HCT 35.3* 33.0* 34.6*  MCV 98.6 96.5 94.0  PLT 160 184 275   Basic Metabolic Panel: Recent Labs  Lab 07/11/24 0514 07/12/24 0527 07/13/24 0322 07/14/24 0322 07/15/24 0508  NA 134* 136 134* 134* 137  K 3.7 5.4* 3.5 3.7 3.7  CL 101 102 99 100 102  CO2 25 21* 24 24 24   GLUCOSE 130* 110* 111* 118* 106*  BUN 21 20 21 21  24*  CREATININE 1.07 1.09 1.19 1.06 1.16  CALCIUM  7.9* 8.0* 8.3* 8.2* 8.2*  MG  --  2.0 2.3  --  2.2   Liver Function Tests: No results for input(s): AST, ALT, ALKPHOS, BILITOT, PROT, ALBUMIN in the last 168 hours. CBG: Recent Labs  Lab 07/14/24 1203 07/14/24 1642 07/14/24 2052 07/15/24 0751 07/15/24 1206  GLUCAP 136* 135* 156* 120* 172*    Discharge time spent:  39 minutes.  Signed: Drue ONEIDA Potter, MD [facility] 07/15/2024

## 2024-07-15 NOTE — Progress Notes (Signed)
 Patient is prepared for discharge per provider orders. Discharge instructions reviewed with nurse at [facility] and spouse at bedside, including medicaitons, follow-up appointments, diet and activity. Vital signs measured and documented. No acute signs of distress noted. Personal belongings gathered and accounted for. Patient waiting for ambulance transfer. IV will be removed prior to departure.

## 2024-07-16 ENCOUNTER — Ambulatory Visit: Admitting: Internal Medicine

## 2024-07-17 DIAGNOSIS — G9341 Metabolic encephalopathy: Secondary | ICD-10-CM | POA: Diagnosis not present

## 2024-07-17 DIAGNOSIS — I447 Left bundle-branch block, unspecified: Secondary | ICD-10-CM | POA: Diagnosis not present

## 2024-07-17 DIAGNOSIS — I5022 Chronic systolic (congestive) heart failure: Secondary | ICD-10-CM | POA: Diagnosis not present

## 2024-07-17 DIAGNOSIS — R0902 Hypoxemia: Secondary | ICD-10-CM | POA: Diagnosis not present

## 2024-07-17 DIAGNOSIS — F0390 Unspecified dementia without behavioral disturbance: Secondary | ICD-10-CM | POA: Diagnosis not present

## 2024-07-17 DIAGNOSIS — U071 COVID-19: Secondary | ICD-10-CM | POA: Diagnosis not present

## 2024-07-17 DIAGNOSIS — Z8616 Personal history of COVID-19: Secondary | ICD-10-CM | POA: Diagnosis not present

## 2024-07-17 DIAGNOSIS — I214 Non-ST elevation (NSTEMI) myocardial infarction: Secondary | ICD-10-CM | POA: Diagnosis not present

## 2024-07-17 DIAGNOSIS — I480 Paroxysmal atrial fibrillation: Secondary | ICD-10-CM | POA: Diagnosis not present

## 2024-07-20 DIAGNOSIS — Z8616 Personal history of COVID-19: Secondary | ICD-10-CM | POA: Diagnosis not present

## 2024-07-20 DIAGNOSIS — I5022 Chronic systolic (congestive) heart failure: Secondary | ICD-10-CM | POA: Diagnosis not present

## 2024-07-20 DIAGNOSIS — I214 Non-ST elevation (NSTEMI) myocardial infarction: Secondary | ICD-10-CM | POA: Diagnosis not present

## 2024-07-20 DIAGNOSIS — Z7901 Long term (current) use of anticoagulants: Secondary | ICD-10-CM | POA: Diagnosis not present

## 2024-07-20 DIAGNOSIS — I480 Paroxysmal atrial fibrillation: Secondary | ICD-10-CM | POA: Diagnosis not present

## 2024-07-20 DIAGNOSIS — F0390 Unspecified dementia without behavioral disturbance: Secondary | ICD-10-CM | POA: Diagnosis not present

## 2024-07-20 DIAGNOSIS — I350 Nonrheumatic aortic (valve) stenosis: Secondary | ICD-10-CM | POA: Diagnosis not present

## 2024-07-20 DIAGNOSIS — I447 Left bundle-branch block, unspecified: Secondary | ICD-10-CM | POA: Diagnosis not present

## 2024-07-20 DIAGNOSIS — E119 Type 2 diabetes mellitus without complications: Secondary | ICD-10-CM | POA: Diagnosis not present

## 2024-07-21 DIAGNOSIS — I350 Nonrheumatic aortic (valve) stenosis: Secondary | ICD-10-CM | POA: Diagnosis not present

## 2024-07-21 DIAGNOSIS — U071 COVID-19: Secondary | ICD-10-CM | POA: Diagnosis not present

## 2024-07-21 DIAGNOSIS — E119 Type 2 diabetes mellitus without complications: Secondary | ICD-10-CM | POA: Diagnosis not present

## 2024-07-21 DIAGNOSIS — I480 Paroxysmal atrial fibrillation: Secondary | ICD-10-CM | POA: Diagnosis not present

## 2024-07-21 DIAGNOSIS — I214 Non-ST elevation (NSTEMI) myocardial infarction: Secondary | ICD-10-CM | POA: Diagnosis not present

## 2024-07-21 DIAGNOSIS — M069 Rheumatoid arthritis, unspecified: Secondary | ICD-10-CM | POA: Diagnosis not present

## 2024-07-21 DIAGNOSIS — I5022 Chronic systolic (congestive) heart failure: Secondary | ICD-10-CM | POA: Diagnosis not present

## 2024-07-21 DIAGNOSIS — E785 Hyperlipidemia, unspecified: Secondary | ICD-10-CM | POA: Diagnosis not present

## 2024-07-21 DIAGNOSIS — F0390 Unspecified dementia without behavioral disturbance: Secondary | ICD-10-CM | POA: Diagnosis not present

## 2024-07-22 DIAGNOSIS — I480 Paroxysmal atrial fibrillation: Secondary | ICD-10-CM | POA: Diagnosis not present

## 2024-07-22 DIAGNOSIS — F0280 Dementia in other diseases classified elsewhere without behavioral disturbance: Secondary | ICD-10-CM | POA: Diagnosis not present

## 2024-07-22 DIAGNOSIS — Z7901 Long term (current) use of anticoagulants: Secondary | ICD-10-CM | POA: Diagnosis not present

## 2024-07-22 DIAGNOSIS — Z8616 Personal history of COVID-19: Secondary | ICD-10-CM | POA: Diagnosis not present

## 2024-07-22 DIAGNOSIS — N183 Chronic kidney disease, stage 3 unspecified: Secondary | ICD-10-CM | POA: Diagnosis not present

## 2024-07-22 DIAGNOSIS — I350 Nonrheumatic aortic (valve) stenosis: Secondary | ICD-10-CM | POA: Diagnosis not present

## 2024-07-22 DIAGNOSIS — E1122 Type 2 diabetes mellitus with diabetic chronic kidney disease: Secondary | ICD-10-CM | POA: Diagnosis not present

## 2024-07-22 DIAGNOSIS — J1282 Pneumonia due to coronavirus disease 2019: Secondary | ICD-10-CM | POA: Diagnosis not present

## 2024-07-22 DIAGNOSIS — M069 Rheumatoid arthritis, unspecified: Secondary | ICD-10-CM | POA: Diagnosis not present

## 2024-07-24 DIAGNOSIS — I350 Nonrheumatic aortic (valve) stenosis: Secondary | ICD-10-CM | POA: Diagnosis not present

## 2024-07-24 DIAGNOSIS — E1122 Type 2 diabetes mellitus with diabetic chronic kidney disease: Secondary | ICD-10-CM | POA: Diagnosis not present

## 2024-07-24 DIAGNOSIS — Z8616 Personal history of COVID-19: Secondary | ICD-10-CM | POA: Diagnosis not present

## 2024-07-24 DIAGNOSIS — Z7901 Long term (current) use of anticoagulants: Secondary | ICD-10-CM | POA: Diagnosis not present

## 2024-07-24 DIAGNOSIS — N183 Chronic kidney disease, stage 3 unspecified: Secondary | ICD-10-CM | POA: Diagnosis not present

## 2024-07-24 DIAGNOSIS — F0280 Dementia in other diseases classified elsewhere without behavioral disturbance: Secondary | ICD-10-CM | POA: Diagnosis not present

## 2024-07-24 DIAGNOSIS — I5022 Chronic systolic (congestive) heart failure: Secondary | ICD-10-CM | POA: Diagnosis not present

## 2024-07-24 DIAGNOSIS — J1282 Pneumonia due to coronavirus disease 2019: Secondary | ICD-10-CM | POA: Diagnosis not present

## 2024-07-24 DIAGNOSIS — I480 Paroxysmal atrial fibrillation: Secondary | ICD-10-CM | POA: Diagnosis not present

## 2024-07-27 DIAGNOSIS — I350 Nonrheumatic aortic (valve) stenosis: Secondary | ICD-10-CM | POA: Diagnosis not present

## 2024-07-27 DIAGNOSIS — E1122 Type 2 diabetes mellitus with diabetic chronic kidney disease: Secondary | ICD-10-CM | POA: Diagnosis not present

## 2024-07-27 DIAGNOSIS — N183 Chronic kidney disease, stage 3 unspecified: Secondary | ICD-10-CM | POA: Diagnosis not present

## 2024-07-27 DIAGNOSIS — F0280 Dementia in other diseases classified elsewhere without behavioral disturbance: Secondary | ICD-10-CM | POA: Diagnosis not present

## 2024-07-27 DIAGNOSIS — Z8616 Personal history of COVID-19: Secondary | ICD-10-CM | POA: Diagnosis not present

## 2024-07-27 DIAGNOSIS — J1282 Pneumonia due to coronavirus disease 2019: Secondary | ICD-10-CM | POA: Diagnosis not present

## 2024-07-27 DIAGNOSIS — Z7901 Long term (current) use of anticoagulants: Secondary | ICD-10-CM | POA: Diagnosis not present

## 2024-07-27 DIAGNOSIS — I480 Paroxysmal atrial fibrillation: Secondary | ICD-10-CM | POA: Diagnosis not present

## 2024-07-27 DIAGNOSIS — I5022 Chronic systolic (congestive) heart failure: Secondary | ICD-10-CM | POA: Diagnosis not present

## 2024-07-28 ENCOUNTER — Telehealth: Payer: Self-pay

## 2024-07-28 NOTE — Transitions of Care (Post Inpatient/ED Visit) (Signed)
 07/28/2024  Name: Donald [name] MRN: [id number] DOB: 22-Aug-1946  Today's TOC FU Call Status: Today's TOC FU Call Status:: Successful TOC FU Call Completed TOC FU Call Complete Date: 07/28/24  Patient's Name and Date of Birth confirmed. Name, DOB  Transition Care Management Follow-up Telephone Call Date of Discharge: 07/27/24 Discharge Facility: Other [facility]) Name of Other ([facility]) Discharge Facility: [facility] Type of Discharge: Inpatient Admission Primary Inpatient Discharge Diagnosis:: congestive heart failure How have you been since you were released from the hospital?: Better Any questions or concerns?: No  Items Reviewed: Did you receive and understand the discharge instructions provided?: Yes Medications obtained,verified, and reconciled?: Yes (Medications Reviewed) Any new allergies since your discharge?: No Dietary orders reviewed?: Yes Do you have support at home?: Yes People in Home [RPT]: spouse  Medications Reviewed Today: Medications Reviewed Today     Reviewed by Emmitt Pan, LPN (Licensed Practical Nurse) on 07/28/24 at [phone]  Med List Status: <None>   Medication Order Taking? Sig Documenting Provider Last Dose Status Informant  ACCU-CHEK AVIVA PLUS test strip [id number] Yes TEST TWO TIMES DAILY Glendia Shad, MD  Active Spouse/Significant Other  ACCU-CHEK SOFTCLIX LANCETS lancets [id number] Yes TEST TWO TIMES DAILY Glendia Shad, MD  Active Spouse/Significant Other  amiodarone (PACERONE) 200 MG tablet [id number] Yes Take 1 tablet (200 mg total) by mouth 2 (two) times daily for 7 days, THEN 1 tablet (200 mg total) daily. Dorinda Drue DASEN, MD  Active   apixaban (ELIQUIS) 5 MG TABS tablet [id number] Yes Take 1 tablet (5 mg total) by mouth 2 (two) times daily. Dorinda Drue DASEN, MD  Active   cetirizine (ZYRTEC) 10 MG tablet [id number] Yes Take 10 mg by mouth daily as needed for allergies. [provider]  Active Spouse/Significant Other   cyanocobalamin  (VITAMIN B12) 1000 MCG tablet [id number]  Take 1,000 mcg by mouth daily.  Patient not taking: Reported on 07/28/2024   [provider]  Active Spouse/Significant Other  leflunomide (ARAVA) 10 MG tablet [id number] Yes Take 10 mg by mouth daily.  [provider]  Active Spouse/Significant Other  magnesium oxide (MAG-OX) 400 (240 Mg) MG tablet [id number]  Take 400 mg by mouth daily.  Patient not taking: Reported on 07/28/2024   [provider]  Active Spouse/Significant Other  metoprolol succinate (TOPROL-XL) 50 MG 24 hr tablet [id number] Yes Take 1 tablet (50 mg total) by mouth 2 (two) times daily. Take with or immediately following a meal. Djan, Drue DASEN, MD  Active   Multiple Vitamin (MULTI-VITAMIN) tablet [id number] Yes Take 1 tablet by mouth daily. [provider]  Active Spouse/Significant Other  rivastigmine (EXELON) 1.5 MG capsule [id number] Yes Take 1.5 mg by mouth 2 (two) times daily. [provider]  Active Spouse/Significant Other  rosuvastatin  (CRESTOR ) 10 MG tablet [id number] Yes Take 1 tablet (10 mg total) by mouth daily. Glendia Shad, MD  Active Spouse/Significant Other  traZODone (DESYREL) 50 MG tablet [id number] Yes Take 0.5 tablets (25 mg total) by mouth at bedtime as needed for sleep. Dorinda Drue DASEN, MD  Active             Home Care and Equipment/Supplies: Were Home Health Services Ordered?: NA Any new equipment or medical supplies ordered?: NA  Functional Questionnaire: Do you need assistance with bathing/showering or dressing?: No Do you need assistance with meal preparation?: No Do you need assistance with eating?: No Do you have difficulty maintaining continence: No Do you need assistance with getting out of bed/getting out of  a chair/moving?: No Do you have difficulty managing or taking your medications?: No  Follow up appointments reviewed: PCP Follow-up appointment confirmed?: Yes Date of PCP follow-up  appointment?: 08/10/24 Follow-up Provider: [facility] Follow-up appointment confirmed?: No Reason Specialist Follow-Up Not Confirmed: Patient has Specialist Provider Number and will Call for Appointment Do you need transportation to your follow-up appointment?: No Do you understand care options if your condition(s) worsen?: Yes-patient verbalized understanding    SIGNATURE Julian Lemmings, LPN [facility] Nurse Health Advisor Direct Dial [phone]

## 2024-08-10 ENCOUNTER — Ambulatory Visit: Admitting: Internal Medicine

## 2024-08-10 ENCOUNTER — Encounter: Payer: Self-pay | Admitting: Internal Medicine

## 2024-08-10 VITALS — BP 118/70 | HR 58 | Temp 97.9°F | Ht 70.0 in | Wt 162.2 lb

## 2024-08-10 DIAGNOSIS — I4891 Unspecified atrial fibrillation: Secondary | ICD-10-CM

## 2024-08-10 DIAGNOSIS — R531 Weakness: Secondary | ICD-10-CM

## 2024-08-10 DIAGNOSIS — E538 Deficiency of other specified B group vitamins: Secondary | ICD-10-CM

## 2024-08-10 DIAGNOSIS — E7800 Pure hypercholesterolemia, unspecified: Secondary | ICD-10-CM

## 2024-08-10 DIAGNOSIS — E1165 Type 2 diabetes mellitus with hyperglycemia: Secondary | ICD-10-CM

## 2024-08-10 DIAGNOSIS — I214 Non-ST elevation (NSTEMI) myocardial infarction: Secondary | ICD-10-CM

## 2024-08-10 DIAGNOSIS — F0390 Unspecified dementia without behavioral disturbance: Secondary | ICD-10-CM

## 2024-08-10 DIAGNOSIS — I420 Dilated cardiomyopathy: Secondary | ICD-10-CM

## 2024-08-10 DIAGNOSIS — D649 Anemia, unspecified: Secondary | ICD-10-CM

## 2024-08-10 DIAGNOSIS — I480 Paroxysmal atrial fibrillation: Secondary | ICD-10-CM

## 2024-08-10 DIAGNOSIS — M069 Rheumatoid arthritis, unspecified: Secondary | ICD-10-CM

## 2024-08-10 DIAGNOSIS — I350 Nonrheumatic aortic (valve) stenosis: Secondary | ICD-10-CM

## 2024-08-10 LAB — BASIC METABOLIC PANEL WITH GFR
BUN: 21 mg/dL (ref 6–23)
CO2: 28 meq/L (ref 19–32)
Calcium: 9.2 mg/dL (ref 8.4–10.5)
Chloride: 104 meq/L (ref 96–112)
Creatinine, Ser: 1.21 mg/dL (ref 0.40–1.50)
GFR: 58.15 mL/min — ABNORMAL LOW (ref >60.00)
Glucose, Bld: 111 mg/dL — ABNORMAL HIGH (ref 70–99)
Potassium: 4.1 meq/L (ref 3.5–5.1)
Sodium: 139 meq/L (ref 135–145)

## 2024-08-10 LAB — CBC WITH DIFFERENTIAL/PLATELET
Basophils Absolute: 0.1 K/uL (ref 0.0–0.1)
Basophils Relative: 1.4 % (ref 0.0–3.0)
Eosinophils Absolute: 0.2 K/uL (ref 0.0–0.7)
Eosinophils Relative: 4.1 % (ref 0.0–5.0)
HCT: 33.9 % — ABNORMAL LOW (ref 39.0–52.0)
Hemoglobin: 11.7 g/dL — ABNORMAL LOW (ref 13.0–17.0)
Lymphocytes Relative: 14.2 % (ref 12.0–46.0)
Lymphs Abs: 0.8 K/uL (ref 0.7–4.0)
MCHC: 34.4 g/dL (ref 30.0–36.0)
MCV: 97.5 fl (ref 78.0–100.0)
Monocytes Absolute: 0.7 K/uL (ref 0.1–1.0)
Monocytes Relative: 12.5 % — ABNORMAL HIGH (ref 3.0–12.0)
Neutro Abs: 3.9 K/uL (ref 1.4–7.7)
Neutrophils Relative %: 67.8 % (ref 43.0–77.0)
Platelets: 180 K/uL (ref 150.0–400.0)
RBC: 3.48 Mil/uL — ABNORMAL LOW (ref 4.22–5.81)
RDW: 13.8 % (ref 11.5–15.5)
WBC: 5.8 K/uL (ref 4.0–10.5)

## 2024-08-10 LAB — HEPATIC FUNCTION PANEL
ALT: 12 U/L (ref 0–53)
AST: 14 U/L (ref 0–37)
Albumin: 4.1 g/dL (ref 3.5–5.2)
Alkaline Phosphatase: 62 U/L (ref 39–117)
Bilirubin, Direct: 0.1 mg/dL (ref 0.0–0.3)
Total Bilirubin: 0.6 mg/dL (ref 0.2–1.2)
Total Protein: 6.4 g/dL (ref 6.0–8.3)

## 2024-08-10 LAB — LIPID PANEL
Cholesterol: 115 mg/dL (ref 0–200)
HDL: 45.2 mg/dL (ref >39.00)
LDL Cholesterol: 41 mg/dL (ref 0–99)
NonHDL: 70.22
Total CHOL/HDL Ratio: 3
Triglycerides: 144 mg/dL (ref 0.0–149.0)
VLDL: 28.8 mg/dL (ref 0.0–40.0)

## 2024-08-10 LAB — TSH: TSH: 1.18 u[IU]/mL (ref 0.35–5.50)

## 2024-08-10 LAB — HM DIABETES FOOT EXAM

## 2024-08-10 MED ORDER — ROSUVASTATIN CALCIUM 10 MG PO TABS: 10.0000 mg | ORAL_TABLET | Freq: Every day | ORAL | 3 refills | Status: AC

## 2024-08-10 NOTE — Progress Notes (Unsigned)
 Subjective:    Patient ID: Donald [name], male    DOB: [birth year], 78 y.o.   MRN: [id number]  Patient here for  Chief Complaint  Patient presents with   Hospitalization Follow-up    HPI Here for hospital follow up - admitted 07/03/24 - 07/15/24 - after presenting with acute onset of generalized weakness. Admitted for sepsis due to covid infection. S/p remdesivir  and completed abx course. Off oxygen. Hospital course complicated by afib with RVR. Started on amiodarone  - now on 200mg  daily. Also on metoprolol  and eliquis . S/p Non STEMI - troponin peaked 1099. EF 35%. HFrEF - ECHO with EF 35-40%, severe AS. Diagnosed with superficial thrombophlebitis. US  negative for DVT. Continue exelon  for dementia. Resume arava  on discharge. Discharged to skilled nursing facility. Discharged from SNF - 07/27/24. He is doing better. Energy is gradually improving. Getting around home better. Still with weakness. Declined home health PT. [facility] outpatient [facility] (with [facility] - physical therapist). Eating. No vomiting or diarrhea. Breathing stable. No increased sob.    Past Medical History:  Diagnosis Date   Bleeding hemorrhoids    a. 2015 - FOB+ stool, colonscopy - thrombosed int hemorrhoids.   Carpal tunnel syndrome    Diabetes mellitus without complication (HCC)    Hyperlipidemia    Nephrolithiasis    H/O stones   Rheumatoid arthritis (HCC)    Severe aortic stenosis    a. 05/2024 Echo: EF 50-55% without regional wall motion abnormalities, mild LVH, grade 1 diastolic dysfunction, normal RV function, mild MR, mild AI, and severe aortic stenosis with a mean aortic valve gradient of 40 mmHg, peak gradient of 62, and aortic valve area of 0.6 cm.   Past Surgical History:  Procedure Laterality Date   APPENDECTOMY  1962   CARPAL TUNNEL RELEASE Bilateral    hands   Family History  Problem Relation Age of Onset   Heart disease Father        Heart attack-54   Hypertension Father    Diabetes Maternal  Grandmother    Social History   Socioeconomic History   Marital status: Married    Spouse name: Not on file   Number of children: 3   Years of education: Not on file   Highest education level: Not on file  Occupational History   Occupation: Retired-Mechanic  Tobacco Use   Smoking status: Former    Types: Cigarettes   Smokeless tobacco: Never  Vaping Use   Vaping status: Never Used  Substance and Sexual Activity   Alcohol use: No    Alcohol/week: 0.0 standard drinks of alcohol   Drug use: No   Sexual activity: Not Currently  Other Topics Concern   Not on file  Social History Narrative   Married   Social Drivers of Health   Financial Resource Strain: Low Risk  (12/19/2023)   Received from [facility]   Overall Financial Resource Strain (CARDIA)    Difficulty of Paying Living Expenses: Not hard at all  Food Insecurity: No Food Insecurity (07/04/2024)   Hunger Vital Sign    Worried About Running Out of Food in the Last Year: Never true    Ran Out of Food in the Last Year: Never true  Transportation Needs: No Transportation Needs (07/04/2024)   PRAPARE - Administrator, Civil Service (Medical): No    Lack of Transportation (Non-Medical): No  Physical Activity: Inactive (08/20/2023)   Exercise Vital Sign    Days of Exercise per  Week: 0 days    Minutes of Exercise per Session: 0 min  Stress: No Stress Concern Present (08/20/2023)   [employer] of Occupational Health - Occupational Stress Questionnaire    Feeling of Stress : Not at all  Social Connections: Moderately Integrated (07/04/2024)   Social Connection and Isolation Panel    Frequency of Communication with Friends and Family: More than three times a week    Frequency of Social Gatherings with Friends and Family: Three times a week    Attends Religious Services: More than 4 times per year    Active Member of Clubs or Organizations: No    Attends Banker Meetings:  Never    Marital Status: Married     Review of Systems  Constitutional:  Negative for appetite change and unexpected weight change.  HENT:  Negative for congestion and sinus pressure.   Respiratory:  Negative for cough, chest tightness and shortness of breath.   Cardiovascular:  Negative for chest pain, palpitations and leg swelling.  Gastrointestinal:  Negative for abdominal pain, diarrhea, nausea and vomiting.  Genitourinary:  Negative for difficulty urinating and dysuria.  Musculoskeletal:  Negative for joint swelling and myalgias.  Skin:  Negative for color change and rash.  Neurological:  Negative for dizziness and headaches.  Psychiatric/Behavioral:  Negative for agitation and dysphoric mood.        Objective:     BP 118/70   Pulse (!) 58   Temp 97.9 F (36.6 C) (Oral)   Ht 5' 10 (1.778 m)   Wt 162 lb 3.2 oz (73.6 kg)   SpO2 98%   BMI 23.27 kg/m  Wt Readings from Last 3 Encounters:  08/10/24 162 lb 3.2 oz (73.6 kg)  07/15/24 158 lb 4.6 oz (71.8 kg)  06/08/24 168 lb 3.2 oz (76.3 kg)    Physical Exam Vitals reviewed.  Constitutional:      General: He is not in acute distress.    Appearance: Normal appearance. He is well-developed.  HENT:     Head: Normocephalic and atraumatic.     Right Ear: External ear normal.     Left Ear: External ear normal.     Mouth/Throat:     Pharynx: No oropharyngeal exudate or posterior oropharyngeal erythema.  Eyes:     General: No scleral icterus.       Right eye: No discharge.        Left eye: No discharge.     Conjunctiva/sclera: Conjunctivae normal.  Cardiovascular:     Rate and Rhythm: Normal rate and regular rhythm.  Pulmonary:     Effort: Pulmonary effort is normal. No respiratory distress.     Breath sounds: Normal breath sounds.  Abdominal:     General: Bowel sounds are normal.     Palpations: Abdomen is soft.     Tenderness: There is no abdominal tenderness.  Musculoskeletal:        General: No swelling or  tenderness.     Cervical back: Neck supple. No tenderness.  Lymphadenopathy:     Cervical: No cervical adenopathy.  Skin:    Findings: No erythema or rash.  Neurological:     Mental Status: He is alert.  Psychiatric:        Mood and Affect: Mood normal.        Behavior: Behavior normal.         Outpatient Encounter Medications as of 08/10/2024  Medication Sig   ACCU-CHEK AVIVA PLUS test strip TEST TWO TIMES DAILY  ACCU-CHEK SOFTCLIX LANCETS lancets TEST TWO TIMES DAILY   amiodarone  (PACERONE ) 200 MG tablet Take 1 tablet (200 mg total) by mouth 2 (two) times daily for 7 days, THEN 1 tablet (200 mg total) daily.   apixaban  (ELIQUIS ) 5 MG TABS tablet Take 1 tablet (5 mg total) by mouth 2 (two) times daily.   leflunomide  (ARAVA ) 10 MG tablet Take 10 mg by mouth daily.    methocarbamol  (ROBAXIN ) 500 MG tablet Take by mouth.   metoprolol  succinate (TOPROL -XL) 50 MG 24 hr tablet Take 1 tablet (50 mg total) by mouth 2 (two) times daily. Take with or immediately following a meal.   rivastigmine  (EXELON ) 1.5 MG capsule Take 1.5 mg by mouth 2 (two) times daily.   traZODone  (DESYREL ) 50 MG tablet Take 0.5 tablets (25 mg total) by mouth at bedtime as needed for sleep.   rosuvastatin  (CRESTOR ) 10 MG tablet Take 1 tablet (10 mg total) by mouth daily.   [DISCONTINUED] cetirizine (ZYRTEC) 10 MG tablet Take 10 mg by mouth daily as needed for allergies. (Patient not taking: Reported on 08/10/2024)   [DISCONTINUED] cyanocobalamin  (VITAMIN B12) 1000 MCG tablet Take 1,000 mcg by mouth daily. (Patient not taking: Reported on 08/10/2024)   [DISCONTINUED] magnesium  oxide (MAG-OX) 400 (240 Mg) MG tablet Take 400 mg by mouth daily. (Patient not taking: Reported on 08/10/2024)   [DISCONTINUED] Multiple Vitamin (MULTI-VITAMIN) tablet Take 1 tablet by mouth daily. (Patient not taking: Reported on 08/10/2024)   [DISCONTINUED] rosuvastatin  (CRESTOR ) 10 MG tablet Take 1 tablet (10 mg total) by mouth daily.   No  facility-administered encounter medications on file as of 08/10/2024.     Lab Results  Component Value Date   WBC 5.8 08/10/2024   HGB 11.7 (L) 08/10/2024   HCT 33.9 (L) 08/10/2024   PLT 180.0 08/10/2024   GLUCOSE 111 (H) 08/10/2024   CHOL 115 08/10/2024   TRIG 144.0 08/10/2024   HDL 45.20 08/10/2024   LDLCALC 41 08/10/2024   ALT 12 08/10/2024   AST 14 08/10/2024   NA 139 08/10/2024   K 4.1 08/10/2024   CL 104 08/10/2024   CREATININE 1.21 08/10/2024   BUN 21 08/10/2024   CO2 28 08/10/2024   TSH 1.18 08/10/2024   PSA 1.19 09/25/2023   INR 1.1 07/04/2024   HGBA1C 5.7 (H) 07/03/2024    DG Chest Port 1 View Result Date: 07/05/2024 EXAM: 1 VIEW(S) XRAY OF THE CHEST 07/05/2024 07:02:55 PM COMPARISON: 07/03/2024 CLINICAL HISTORY: Fever [id number]. fever. FINDINGS: LUNGS AND PLEURA: New patchy opacities in the lungs bilaterally, lower lung predominant, slightly favoring interstitial edema over multifocal pneumonia. Small left pleural effusion, new. No pneumothorax. HEART AND MEDIASTINUM: No acute abnormality of the cardiac and mediastinal silhouettes. Thoracic aortic atherosclerosis. BONES AND SOFT TISSUES: No acute osseous abnormality. IMPRESSION: 1. New bilateral lower lung predominant patchy opacities, slightly favoring interstitial edema over multifocal pneumonia. 2. Small left pleural effusion. Electronically signed by: Pinkie Pebbles MD 07/05/2024 07:42 PM EDT RP Workstation: [id number]   ECHOCARDIOGRAM LIMITED Result Date: 07/04/2024    ECHOCARDIOGRAM LIMITED REPORT   Patient Name:   NHAN QUALLEY Date of Exam: 07/04/2024 Medical Rec #:  [id number]    Height:       70.0 in Accession #:    [id number]   Weight:       170.0 lb Date of Birth:  04/21/1946    BSA:          1.948 m Patient Age:    18 years  BP:           146/68 mmHg Patient Gender: M            HR:           90 bpm. Exam Location:  [facility] Procedure: 2D Echo, Cardiac Doppler, Color Doppler and Intracardiac             Opacification Agent (Both Spectral and Color Flow Doppler were            utilized during procedure). Indications:     Heart block, LBBB I44.7, SOB, covid, NSTEMI  History:         Patient has prior history of Echocardiogram examinations. Risk                  Factors:Diabetes.  Sonographer:     Bari Roar Referring Phys:  [id number] CHRISTOPHER RONALD BERGE Diagnosing Phys: Timothy Gollan MD IMPRESSIONS  1. Left ventricular ejection fraction, by estimation, is 35 to 40%. The left ventricle has moderately decreased function. The left ventricle demonstrates global hypokinesis. Left ventricular diastolic parameters are consistent with Grade I diastolic dysfunction (impaired relaxation).  2. Right ventricular systolic function is normal. The right ventricular size is normal.  3. The mitral valve is normal in structure. Mild mitral valve regurgitation. No evidence of mitral stenosis.  4. The aortic valve is calcified. Aortic valve regurgitation is not visualized. Severe aortic valve stenosis by Aortic valve area, by VTI measures 0.48 cm. Aortic valve mean gradient measures 24.0 mmHg and Aortic valve Vmax measures 3.27 m/s, likely underestimated in setting of cardiomyopathy.  5. The inferior vena cava is normal in size with greater than 50% respiratory variability, suggesting right atrial pressure of 3 mmHg. FINDINGS  Left Ventricle: Left ventricular ejection fraction, by estimation, is 35 to 40%. The left ventricle has moderately decreased function. The left ventricle demonstrates global hypokinesis. Definity  contrast agent was given IV to delineate the left ventricular endocardial borders. The left ventricular internal cavity size was normal in size. There is no left ventricular hypertrophy. Left ventricular diastolic parameters are consistent with Grade I diastolic dysfunction (impaired relaxation). Right Ventricle: The right ventricular size is normal. No increase in right ventricular wall thickness. Right ventricular  systolic function is normal. Left Atrium: Left atrial size was normal in size. Right Atrium: Right atrial size was normal in size. Pericardium: There is no evidence of pericardial effusion. Mitral Valve: The mitral valve is normal in structure. Mild mitral valve regurgitation. No evidence of mitral valve stenosis. MV peak gradient, 8.9 mmHg. The mean mitral valve gradient is 4.0 mmHg. Tricuspid Valve: The tricuspid valve is normal in structure. Tricuspid valve regurgitation is mild . No evidence of tricuspid stenosis. Aortic Valve: The aortic valve is normal in structure. Aortic valve regurgitation is not visualized. Severe aortic stenosis is present. Aortic valve mean gradient measures 24.0 mmHg. Aortic valve peak gradient measures 42.8 mmHg. Aortic valve area, by VTI measures 0.48 cm. Pulmonic Valve: The pulmonic valve was normal in structure. Pulmonic valve regurgitation is not visualized. No evidence of pulmonic stenosis. Aorta: The aortic root is normal in size and structure. Venous: The inferior vena cava is normal in size with greater than 50% respiratory variability, suggesting right atrial pressure of 3 mmHg. IAS/Shunts: No atrial level shunt detected by color flow Doppler. Additional Comments: Color Doppler performed.  LEFT VENTRICLE PLAX 2D LVIDd:         4.40 cm     Diastology LVIDs:  3.50 cm     LV e' medial:    5.33 cm/s LV PW:         1.00 cm     LV E/e' medial:  19.3 LV IVS:        1.30 cm     LV e' lateral:   6.20 cm/s LVOT diam:     1.80 cm     LV E/e' lateral: 16.6 LV SV:         31 LV SV Index:   16 LVOT Area:     2.54 cm  LV Volumes (MOD) LV vol d, MOD A2C: 84.3 ml LV vol d, MOD A4C: 76.1 ml LV vol s, MOD A2C: 57.2 ml LV vol s, MOD A4C: 43.5 ml LV SV MOD A2C:     27.1 ml LV SV MOD A4C:     76.1 ml LV SV MOD BP:      30.7 ml RIGHT VENTRICLE RV Basal diam:  2.80 cm RV Mid diam:    2.60 cm RV S prime:     13.40 cm/s TAPSE (M-mode): 2.3 cm LEFT ATRIUM             Index        RIGHT ATRIUM            Index LA diam:        4.00 cm 2.05 cm/m   RA Area:     11.10 cm LA Vol (A2C):   48.3 ml 24.79 ml/m  RA Volume:   22.20 ml  11.40 ml/m LA Vol (A4C):   50.3 ml 25.82 ml/m LA Biplane Vol: 49.4 ml 25.36 ml/m  AORTIC VALVE                     PULMONIC VALVE AV Area (Vmax):    0.55 cm      PV Vmax:        0.95 m/s AV Area (Vmean):   0.53 cm      PV Peak grad:   3.6 mmHg AV Area (VTI):     0.48 cm      RVOT Peak grad: 2 mmHg AV Vmax:           327.00 cm/s AV Vmean:          228.000 cm/s AV VTI:            0.642 m AV Peak Grad:      42.8 mmHg AV Mean Grad:      24.0 mmHg LVOT Vmax:         71.00 cm/s LVOT Vmean:        47.300 cm/s LVOT VTI:          0.120 m LVOT/AV VTI ratio: 0.19  AORTA Ao Root diam: 2.80 cm MITRAL VALVE                TRICUSPID VALVE MV Area (PHT): 7.90 cm     TR Peak grad:   42.0 mmHg MV Area VTI:   1.77 cm     TR Vmax:        324.00 cm/s MV Peak grad:  8.9 mmHg MV Mean grad:  4.0 mmHg     SHUNTS MV Vmax:       1.49 m/s     Systemic VTI:  0.12 m MV Vmean:      89.7 cm/s    Systemic Diam: 1.80 cm MV Decel Time: 96 msec MV E velocity: 103.00 cm/s MV  A velocity: 140.00 cm/s MV E/A ratio:  0.74 MV A Prime:    16.6 cm/s Evalene Lunger MD Electronically signed by Evalene Lunger MD Signature Date/Time: 07/04/2024/4:38:39 PM    Final        Assessment & Plan:  Atrial fibrillation with rapid ventricular response (HCC)  Anemia, unspecified type Assessment & Plan: Check cbc. F/u after hospitalization.   Orders: -     CBC with Differential/Platelet  Hypercholesterolemia Assessment & Plan: Continue crestor .  Low cholesterol diet and exercise. Follow lipid panel.   Orders: -     Lipid panel -     Hepatic function panel -     TSH  Type 2 diabetes mellitus with hyperglycemia, without long-term current use of insulin  (HCC) Assessment & Plan: Follow met b and A1c.   Orders: -     Basic metabolic panel with GFR  Weakness Assessment & Plan: Recent hospitalization as  outlined. Is doing some better. Still with weakness. Declined home health PT. Request PT - [facility].   Orders: -     Ambulatory referral to Physical Therapy  Vitamin B12 deficiency Assessment & Plan: Check vitamin B12 level with next labs.    Rheumatoid arthritis, involving unspecified site, unspecified whether rheumatoid factor present [facility]) Assessment & Plan: Continue f/u with rheuamtology. Continue arava .    PAF (paroxysmal atrial fibrillation) (HCC) Assessment & Plan: Continue amiodarone  and eliquis . Continue toprol . Rate controlled. Cost is an issue for eliquis . Refer for assistance.   Orders: -     AMB Referral VBCI Care Management -     Ambulatory referral to Physical Therapy  Non-ST elevation (NSTEMI) myocardial infarction [facility]) Assessment & Plan:  Admitted for sepsis due to covid infection. S/p remdesivir  and completed abx course. Off oxygen. Hospital course complicated by afib with RVR. Started on amiodarone  - now on 200mg  daily. Also on metoprolol  and eliquis . S/p Non STEMI - troponin peaked 1099. EF 35%. HFrEF - ECHO with EF 35-40%, severe AS. Overall stable. Plan f/u with cardiology/EP.   Orders: -     Ambulatory referral to Physical Therapy  Dilated cardiomyopathy [facility]) Assessment & Plan:  Admitted for sepsis due to covid infection. S/p remdesivir  and completed abx course. Off oxygen. Hospital course complicated by afib with RVR. Started on amiodarone  - now on 200mg  daily. Also on metoprolol  and eliquis . S/p Non STEMI - troponin peaked 1099. EF 35%. HFrEF - ECHO with EF 35-40%, severe AS. No evidence of volume overload on exam. Follow.   Orders: -     Ambulatory referral to Physical Therapy  Dementia without behavioral disturbance (HCC) Assessment & Plan: Continue on exelon . Continue f/u with neurology.    Aortic valve stenosis, etiology of cardiac valve disease unspecified Assessment & Plan: ECHO 02/2022 - Echo showed LVEF 50-55%, no WMA, G1DD, trivial  MR, mod to severe AS with Aortic valve mean gradient , peak gradient , , Vmax 3.06  m/s, DVI = 0.26, Aortic valve area 0.95.  F/u ECHO 03/2023 - moderate to severe AS.   Dr Verlin evaluated 11/15/23 - f/u echo in 6 months.  Dr Verlin evlauated 05/2024 - f/u echo in 6 months.   Admitted for sepsis due to covid infection. S/p remdesivir  and completed abx course. Off oxygen. Hospital course complicated by afib with RVR. Started on amiodarone  - now on 200mg  daily. Also on metoprolol  and eliquis . S/p Non STEMI - troponin peaked 1099. EF 35%. HFrEF - ECHO with EF 35-40%, severe AS. Discussed f/u with cardiology.  Other orders -     Rosuvastatin  Calcium ; Take 1 tablet (10 mg total) by mouth daily.  Dispense: 90 tablet; Refill: 3     Allena Hamilton, MD

## 2024-08-12 ENCOUNTER — Ambulatory Visit: Payer: Self-pay | Admitting: Internal Medicine

## 2024-08-12 ENCOUNTER — Other Ambulatory Visit: Payer: Self-pay | Admitting: Internal Medicine

## 2024-08-12 DIAGNOSIS — D649 Anemia, unspecified: Secondary | ICD-10-CM

## 2024-08-12 DIAGNOSIS — R944 Abnormal results of kidney function studies: Secondary | ICD-10-CM

## 2024-08-12 NOTE — Progress Notes (Signed)
Orders placed for f/u labs.  

## 2024-08-16 ENCOUNTER — Encounter: Payer: Self-pay | Admitting: Internal Medicine

## 2024-08-16 NOTE — Assessment & Plan Note (Signed)
 Recent hospitalization as outlined. Is doing some better. Still with weakness. Declined home health PT. Request PT - [facility].

## 2024-08-16 NOTE — Assessment & Plan Note (Signed)
 Follow met b and A1c.

## 2024-08-16 NOTE — Assessment & Plan Note (Signed)
 Continue f/u with rheuamtology. Continue arava .

## 2024-08-16 NOTE — Assessment & Plan Note (Signed)
 Check cbc. F/u after hospitalization.

## 2024-08-16 NOTE — Assessment & Plan Note (Addendum)
 Continue amiodarone  and eliquis . Continue toprol . Rate controlled. Cost is an issue for eliquis . Refer for assistance.

## 2024-08-16 NOTE — Assessment & Plan Note (Signed)
 Admitted for sepsis due to covid infection. S/p remdesivir  and completed abx course. Off oxygen. Hospital course complicated by afib with RVR. Started on amiodarone  - now on 200mg  daily. Also on metoprolol  and eliquis . S/p Non STEMI - troponin peaked 1099. EF 35%. HFrEF - ECHO with EF 35-40%, severe AS. No evidence of volume overload on exam. Follow.

## 2024-08-16 NOTE — Assessment & Plan Note (Signed)
 Admitted for sepsis due to covid infection. S/p remdesivir  and completed abx course. Off oxygen. Hospital course complicated by afib with RVR. Started on amiodarone  - now on 200mg  daily. Also on metoprolol  and eliquis . S/p Non STEMI - troponin peaked 1099. EF 35%. HFrEF - ECHO with EF 35-40%, severe AS. Overall stable. Plan f/u with cardiology/EP.

## 2024-08-16 NOTE — Assessment & Plan Note (Signed)
Continue crestor.  Low cholesterol diet and exercise.  Follow lipid panel.  

## 2024-08-16 NOTE — Assessment & Plan Note (Signed)
 Continue on exelon . Continue f/u with neurology.

## 2024-08-16 NOTE — Assessment & Plan Note (Signed)
 ECHO 02/2022 - Echo showed LVEF 50-55%, no WMA, G1DD, trivial MR, mod to severe AS with Aortic valve mean gradient , peak gradient , , Vmax 3.06  m/s, DVI = 0.26, Aortic valve area 0.95.  F/u ECHO 03/2023 - moderate to severe AS.   Dr Verlin evaluated 11/15/23 - f/u echo in 6 months.  Dr Verlin evlauated 05/2024 - f/u echo in 6 months.   Admitted for sepsis due to covid infection. S/p remdesivir  and completed abx course. Off oxygen. Hospital course complicated by afib with RVR. Started on amiodarone  - now on 200mg  daily. Also on metoprolol  and eliquis . S/p Non STEMI - troponin peaked 1099. EF 35%. HFrEF - ECHO with EF 35-40%, severe AS. Discussed f/u with cardiology.

## 2024-08-16 NOTE — Assessment & Plan Note (Signed)
 Check vitamin B12 level with next labs.

## 2024-08-20 ENCOUNTER — Telehealth: Payer: Self-pay

## 2024-08-20 NOTE — Progress Notes (Signed)
 Complex Care Management Note  Care Guide Note 08/20/2024 Name: Donald [name] MRN: [id number] DOB: 1946-07-09  Donald [name] is a 78 y.o. year old male who sees Glendia Shad, MD for primary care. I reached out to Donald [name] by phone today to offer complex care management services.  Mr. Beach was given information about Complex Care Management services today including:   The Complex Care Management services include support from the care team which includes your Nurse Care Manager, Clinical Social Worker, or Pharmacist.  The Complex Care Management team is here to help remove barriers to the health concerns and goals most important to you. Complex Care Management services are voluntary, and the patient may decline or stop services at any time by request to their care team member.   Complex Care Management Consent Status: Patient agreed to services and verbal consent obtained.   Follow up plan:  Telephone appointment with complex care management team member scheduled for:  08/24/24 at 10:00 a.m.   Encounter Outcome:  Patient Scheduled  Dreama [facility]  [facility], [facility] VBCI Assistant Direct Dial: [phone]  Fax: [phone]

## 2024-08-23 ENCOUNTER — Telehealth: Payer: Self-pay | Admitting: Internal Medicine

## 2024-08-23 NOTE — Telephone Encounter (Signed)
 Donald [name] was placed on trazodone  in the hospital. Please call and see if he is sleeping now and if he needs a refill on the trazodone . Thanks

## 2024-08-24 ENCOUNTER — Other Ambulatory Visit

## 2024-08-24 ENCOUNTER — Encounter: Payer: Self-pay | Admitting: Pharmacist

## 2024-08-24 DIAGNOSIS — I420 Dilated cardiomyopathy: Secondary | ICD-10-CM

## 2024-08-24 DIAGNOSIS — I480 Paroxysmal atrial fibrillation: Secondary | ICD-10-CM

## 2024-08-24 MED ORDER — APIXABAN 5 MG PO TABS: 5.0000 mg | ORAL_TABLET | Freq: Two times a day (BID) | ORAL | 0 refills | Status: DC

## 2024-08-24 NOTE — Progress Notes (Signed)
° °  08/24/2024 Name: Donald [name] MRN: [id number] DOB: 12/05/1945  Subjective  Chief Complaint  Patient presents with   Medication Access    Care Team: Primary Care Provider: Glendia Shad, MD  Reason for visit: ?  Donald [name] is a 78 y.o. male who presents today for a telephone visit with the pharmacist due to medication access concerns regarding their Eliquis . ?   Medication Access: ?  Reports that all medications are not affordable.  New Dx Afib, started on Eliquis  though notes cost through insurance was $47/month which is not affordable long-term.  Notes he is still taking Eliquis , has 6 tablets remaining from hospital discharge.   Prescription drug coverage: YES Payor: [employer] MEDICARE / Plan: [employer] MEDICARE HMO / Product Type: *No Product type* / .  Plan ID: [id number] Summary of Benefit: 2025 = $250 deductible then $47/month thereafter.   Current Patient Assistance: none  Patient lives in a household of 2 with an income consisting of SSI retirement + his pension.   Medicare LIS Eligible: No . Monthly income limit exceeded  2025 Poverty Guidelines  Family Size  300%  500%   1  $46,950  $78,250  2  $63,450  $105,750  3  $79,950 $133,250  4  $96,450 $160,750  Programs BMS Eliquis  JJ Xarelto (Must spend 3-4% income first)  MedicareD: [employer] cardiomyopathy grant   Assessment and Plan:   1. Medication Access Given cardiomyopathy diagnosis, patient should qualify for the [employer] Cardiomyopathy Medicare Access fund which would cover the cost of Eliquis  and any other heart-failure medications as needed in the future.  Alternatively: Xarelto PAP may be an option if needed (if income <$50k, this would bypass the out of pocket spend requirement for Medicare patients). Unclear at this time if patient qualifies, notes his income likely around the cutoff.   [facility] of [facility] remaining. Future refills may go to mailorder if preferred.    [employer]   Medication(s): All cardiomyopathy medications (now including Eliquis )     Application Status:  Approved for re-enrollment    [employer] ID: [id number] Fund: Cardiomyopathy - Medicare Access Assistance Type: Co-pay Start Date: 07/25/2024 End Date: 07/24/2025               Rx Card: Card No.  [id number] RX BIN:  [id number] PCN:  [id number] Group:  [id number]     Future Appointments  Date Time Provider Department Center  08/26/2024 11:00 AM [facility] [address]  09/22/2024  4:00 PM Glendia Shad, MD [facility] [address]  11/19/2024  1:00 PM [facility]  2 [facility] None    Manuelita FABIENE Kobs, PharmD Clinical Pharmacist [facility] [phone]

## 2024-08-25 MED ORDER — TRAZODONE HCL 50 MG PO TABS: 25.0000 mg | ORAL_TABLET | Freq: Every evening | ORAL | 1 refills | Status: AC | PRN

## 2024-08-25 NOTE — Addendum Note (Signed)
 Addended by: GLENDIA ALLENA RAMAN on: 08/25/2024 05:29 AM   Modules accepted: Orders

## 2024-08-25 NOTE — Telephone Encounter (Signed)
 Rx for [facility]

## 2024-08-26 ENCOUNTER — Other Ambulatory Visit

## 2024-08-26 DIAGNOSIS — D649 Anemia, unspecified: Secondary | ICD-10-CM

## 2024-08-26 DIAGNOSIS — R944 Abnormal results of kidney function studies: Secondary | ICD-10-CM | POA: Diagnosis not present

## 2024-08-26 LAB — CBC WITH DIFFERENTIAL/PLATELET
Basophils Absolute: 0.1 K/uL (ref 0.0–0.1)
Basophils Relative: 1.2 % (ref 0.0–3.0)
Eosinophils Absolute: 0.3 K/uL (ref 0.0–0.7)
Eosinophils Relative: 5.2 % — ABNORMAL HIGH (ref 0.0–5.0)
HCT: 34.6 % — ABNORMAL LOW (ref 39.0–52.0)
Hemoglobin: 12 g/dL — ABNORMAL LOW (ref 13.0–17.0)
Lymphocytes Relative: 14 % (ref 12.0–46.0)
Lymphs Abs: 0.9 K/uL (ref 0.7–4.0)
MCHC: 34.8 g/dL (ref 30.0–36.0)
MCV: 97.7 fl (ref 78.0–100.0)
Monocytes Absolute: 0.7 K/uL (ref 0.1–1.0)
Monocytes Relative: 11.3 % (ref 3.0–12.0)
Neutro Abs: 4.5 K/uL (ref 1.4–7.7)
Neutrophils Relative %: 68.3 % (ref 43.0–77.0)
Platelets: 175 K/uL (ref 150.0–400.0)
RBC: 3.55 Mil/uL — ABNORMAL LOW (ref 4.22–5.81)
RDW: 13.9 % (ref 11.5–15.5)
WBC: 6.6 K/uL (ref 4.0–10.5)

## 2024-08-26 LAB — BASIC METABOLIC PANEL WITH GFR
BUN: 21 mg/dL (ref 6–23)
CO2: 28 meq/L (ref 19–32)
Calcium: 9 mg/dL (ref 8.4–10.5)
Chloride: 105 meq/L (ref 96–112)
Creatinine, Ser: 1.29 mg/dL (ref 0.40–1.50)
GFR: 53.83 mL/min — ABNORMAL LOW (ref >60.00)
Glucose, Bld: 146 mg/dL — ABNORMAL HIGH (ref 70–99)
Potassium: 4.6 meq/L (ref 3.5–5.1)
Sodium: 140 meq/L (ref 135–145)

## 2024-08-26 LAB — IBC + FERRITIN
Ferritin: 327.2 ng/mL — ABNORMAL HIGH (ref 22.0–322.0)
Iron: 78 ug/dL (ref 42–165)
Saturation Ratios: 29.2 % (ref 20.0–50.0)
TIBC: 267.4 ug/dL (ref 250.0–450.0)
Transferrin: 191 mg/dL — ABNORMAL LOW (ref 212.0–360.0)

## 2024-08-26 LAB — VITAMIN B12: Vitamin B-12: 642 pg/mL (ref 211–911)

## 2024-08-27 ENCOUNTER — Other Ambulatory Visit: Payer: Self-pay

## 2024-08-27 ENCOUNTER — Other Ambulatory Visit: Payer: Self-pay | Admitting: Internal Medicine

## 2024-08-27 ENCOUNTER — Ambulatory Visit: Payer: Self-pay | Admitting: Internal Medicine

## 2024-08-27 DIAGNOSIS — R944 Abnormal results of kidney function studies: Secondary | ICD-10-CM

## 2024-08-27 DIAGNOSIS — D649 Anemia, unspecified: Secondary | ICD-10-CM

## 2024-08-27 NOTE — Telephone Encounter (Unsigned)
 Copied from CRM [id number]. Topic: Clinical - Medication Refill >> Aug 27, 2024  2:29 PM Drema MATSU wrote: Medication: metoprolol  succinate (TOPROL -XL) 50 MG 24 hr tablet  Has the patient contacted their pharmacy? No (Agent: If no, request that the patient contact the pharmacy for the refill. If patient does not wish to contact the pharmacy document the reason why and proceed with request.) (Agent: If yes, when and what did the pharmacy advise?)  This is the patient's preferred pharmacy:  [facility] [address] [place] - [address] [place] [address] Phone: [phone] Fax: [phone]  Is this the correct pharmacy for this prescription? Yes If no, delete pharmacy and type the correct one.   Has the prescription been filled recently? No 1 st time getting a refill/ was prescribed in hospital  Is the patient out of the medication? Yes  Has the patient been seen for an appointment in the last year OR does the patient have an upcoming appointment? Yes  Can we respond through MyChart? Yes  Agent: Please be advised that Rx refills may take up to 3 business days. We ask that you follow-up with your pharmacy.

## 2024-08-28 MED ORDER — METOPROLOL SUCCINATE ER 50 MG PO TB24: 50.0000 mg | ORAL_TABLET | Freq: Two times a day (BID) | ORAL | 2 refills | Status: DC

## 2024-08-28 NOTE — Telephone Encounter (Signed)
 Rx ok'd for metoprolol . Reviewed discharge summary. He was to continue metoprolol  50mg  bid. Rx sent to pharmacy.

## 2024-09-22 ENCOUNTER — Ambulatory Visit: Admitting: Internal Medicine

## 2024-09-22 ENCOUNTER — Encounter: Payer: Self-pay | Admitting: Internal Medicine

## 2024-09-22 VITALS — BP 120/70 | HR 66 | Temp 98.2°F | Ht 70.0 in | Wt 165.2 lb

## 2024-09-22 DIAGNOSIS — I420 Dilated cardiomyopathy: Secondary | ICD-10-CM | POA: Diagnosis not present

## 2024-09-22 DIAGNOSIS — R944 Abnormal results of kidney function studies: Secondary | ICD-10-CM

## 2024-09-22 DIAGNOSIS — F0390 Unspecified dementia without behavioral disturbance: Secondary | ICD-10-CM | POA: Diagnosis not present

## 2024-09-22 DIAGNOSIS — I350 Nonrheumatic aortic (valve) stenosis: Secondary | ICD-10-CM | POA: Diagnosis not present

## 2024-09-22 DIAGNOSIS — R531 Weakness: Secondary | ICD-10-CM | POA: Diagnosis not present

## 2024-09-22 DIAGNOSIS — E1165 Type 2 diabetes mellitus with hyperglycemia: Secondary | ICD-10-CM

## 2024-09-22 DIAGNOSIS — E7800 Pure hypercholesterolemia, unspecified: Secondary | ICD-10-CM

## 2024-09-22 DIAGNOSIS — I480 Paroxysmal atrial fibrillation: Secondary | ICD-10-CM | POA: Diagnosis not present

## 2024-09-22 DIAGNOSIS — M069 Rheumatoid arthritis, unspecified: Secondary | ICD-10-CM | POA: Diagnosis not present

## 2024-09-22 DIAGNOSIS — D649 Anemia, unspecified: Secondary | ICD-10-CM

## 2024-09-22 NOTE — Progress Notes (Signed)
 "  Subjective:    Patient ID: Donald [name], male    DOB: Oct 28, 1945, 79 y.o.   MRN: [id number]  Patient here for  Chief Complaint  Patient presents with   hospitilization f/up    HPI Here for a scheduled follow up. Admitted 07/03/24 - 07/15/24 - after presenting with acute onset of generalized weakness. Admitted for sepsis due to covid infection. S/p remdesivir  and completed abx course. Off oxygen. Hospital course complicated by afib with RVR. Started on amiodarone  - now on 200mg  daily. Called and confirm with wife. Also on metoprolol  and eliquis . S/p Non STEMI - troponin peaked 1099. EF 35%. HFrEF - ECHO with EF 35-40%, severe AS. Diagnosed with superficial thrombophlebitis. US  negative for DVT. Continue exelon  for dementia. Resume arava  on discharge. Discharged to skilled nursing facility. Discharged from SNF - 07/27/24. Last visit, was doing better. Referred to outpatient PT. He reports increased fatigue. Persistent decreased energy. No chest pain. Reports breathing is overall stable. No increased cough or congestion. States he is eating. No vomiting. No diarrhea. Needs f/u labs.     Past Medical History:  Diagnosis Date   Bleeding hemorrhoids    a. 2015 - FOB+ stool, colonscopy - thrombosed int hemorrhoids.   Carpal tunnel syndrome    Diabetes mellitus without complication (HCC)    Hyperlipidemia    Nephrolithiasis    H/O stones   Rheumatoid arthritis (HCC)    Severe aortic stenosis    a. 05/2024 Echo: EF 50-55% without regional wall motion abnormalities, mild LVH, grade 1 diastolic dysfunction, normal RV function, mild MR, mild AI, and severe aortic stenosis with a mean aortic valve gradient of 40 mmHg, peak gradient of 62, and aortic valve area of 0.6 cm.   Past Surgical History:  Procedure Laterality Date   APPENDECTOMY  1962   CARPAL TUNNEL RELEASE Bilateral    hands   Family History  Problem Relation Age of Onset   Heart disease Father        Heart attack-54    Hypertension Father    Diabetes Maternal Grandmother    Social History   Socioeconomic History   Marital status: Married    Spouse name: Not on file   Number of children: 3   Years of education: Not on file   Highest education level: Not on file  Occupational History   Occupation: Retired-Mechanic  Tobacco Use   Smoking status: Former    Types: Cigarettes   Smokeless tobacco: Never  Vaping Use   Vaping status: Never Used  Substance and Sexual Activity   Alcohol use: No    Alcohol/week: 0.0 standard drinks of alcohol   Drug use: No   Sexual activity: Not Currently  Other Topics Concern   Not on file  Social History Narrative   Married   Social Drivers of Health   Tobacco Use: Medium Risk (09/27/2024)   Patient History    Smoking Tobacco Use: Former    Smokeless Tobacco Use: Never    Passive Exposure: Not on file  Financial Resource Strain: Low Risk  (12/19/2023)   Received from [facility]   Overall Financial Resource Strain (CARDIA)    Difficulty of Paying Living Expenses: Not hard at all  Food Insecurity: No Food Insecurity (07/04/2024)   Epic    Worried About Running Out of Food in the Last Year: Never true    Ran Out of Food in the Last Year: Never true  Transportation Needs: No Transportation Needs (07/04/2024)  Epic    Lack of Transportation (Medical): No    Lack of Transportation (Non-Medical): No  Physical Activity: Inactive (08/20/2023)   Exercise Vital Sign    Days of Exercise per Week: 0 days    Minutes of Exercise per Session: 0 min  Stress: No Stress Concern Present (08/20/2023)   [employer] of Occupational Health - Occupational Stress Questionnaire    Feeling of Stress : Not at all  Social Connections: Moderately Integrated (07/04/2024)   Social Connection and Isolation Panel    Frequency of Communication with Friends and Family: More than three times a week    Frequency of Social Gatherings with Friends and Family: Three  times a week    Attends Religious Services: More than 4 times per year    Active Member of Clubs or Organizations: No    Attends Banker Meetings: Never    Marital Status: Married  Depression (PHQ2-9): Low Risk (09/22/2024)   Depression (PHQ2-9)    PHQ-2 Score: 0  Alcohol Screen: Low Risk (08/20/2023)   Alcohol Screen    Last Alcohol Screening Score (AUDIT): 0  Housing: Low Risk (07/04/2024)   Epic    Unable to Pay for Housing in the Last Year: No    Number of Times Moved in the Last Year: 0    Homeless in the Last Year: No  Utilities: Not At Risk (07/04/2024)   Epic    Threatened with loss of utilities: No  Health Literacy: Adequate Health Literacy (08/20/2023)   B1300 Health Literacy    Frequency of need for help with medical instructions: Never     Review of Systems  Constitutional:  Positive for fatigue. Negative for fever.  HENT:  Negative for congestion and sinus pressure.   Respiratory:  Negative for cough and chest tightness.        Breathing stable.   Cardiovascular:  Negative for chest pain, palpitations and leg swelling.  Gastrointestinal:  Negative for abdominal pain, diarrhea, nausea and vomiting.  Genitourinary:  Negative for difficulty urinating and dysuria.  Musculoskeletal:  Negative for joint swelling and myalgias.  Skin:  Negative for color change and rash.  Neurological:  Negative for dizziness and headaches.  Psychiatric/Behavioral:  Negative for agitation and dysphoric mood.        Objective:     BP 120/70   Pulse 66   Temp 98.2 F (36.8 C) (Oral)   Ht 5' 10 (1.778 m)   Wt 165 lb 3.2 oz (74.9 kg)   SpO2 98%   BMI 23.70 kg/m  Wt Readings from Last 3 Encounters:  09/22/24 165 lb 3.2 oz (74.9 kg)  08/10/24 162 lb 3.2 oz (73.6 kg)  07/15/24 158 lb 4.6 oz (71.8 kg)    Physical Exam Vitals reviewed.  Constitutional:      General: He is not in acute distress.    Appearance: Normal appearance. He is well-developed.  HENT:      Head: Normocephalic and atraumatic.     Right Ear: External ear normal.     Left Ear: External ear normal.     Mouth/Throat:     Pharynx: No oropharyngeal exudate or posterior oropharyngeal erythema.  Eyes:     General: No scleral icterus.       Right eye: No discharge.        Left eye: No discharge.     Conjunctiva/sclera: Conjunctivae normal.  Cardiovascular:     Rate and Rhythm: Normal rate and regular rhythm.  Pulmonary:  Effort: Pulmonary effort is normal. No respiratory distress.     Breath sounds: Normal breath sounds.  Abdominal:     General: Bowel sounds are normal.     Palpations: Abdomen is soft.     Tenderness: There is no abdominal tenderness.  Musculoskeletal:        General: No swelling or tenderness.     Cervical back: Neck supple. No tenderness.  Lymphadenopathy:     Cervical: No cervical adenopathy.  Skin:    Findings: No erythema or rash.  Neurological:     Mental Status: He is alert.  Psychiatric:        Mood and Affect: Mood normal.        Behavior: Behavior normal.         Outpatient Encounter Medications as of 09/22/2024  Medication Sig   ACCU-CHEK AVIVA PLUS test strip TEST TWO TIMES DAILY   ACCU-CHEK SOFTCLIX LANCETS lancets TEST TWO TIMES DAILY   amiodarone  (PACERONE ) 200 MG tablet Take 1 tablet (200 mg total) by mouth 2 (two) times daily for 7 days, THEN 1 tablet (200 mg total) daily.   leflunomide  (ARAVA ) 10 MG tablet Take 10 mg by mouth daily.    methocarbamol  (ROBAXIN ) 500 MG tablet Take by mouth.   rivastigmine  (EXELON ) 1.5 MG capsule Take 1.5 mg by mouth 2 (two) times daily.   rosuvastatin  (CRESTOR ) 10 MG tablet Take 1 tablet (10 mg total) by mouth daily.   traZODone  (DESYREL ) 50 MG tablet Take 0.5 tablets (25 mg total) by mouth at bedtime as needed for sleep.   apixaban  (ELIQUIS ) 5 MG TABS tablet Take 1 tablet (5 mg total) by mouth 2 (two) times daily. Bill SecondaryBETHA ALLY [id number], PCN [id number], GRP [id number], ID [id number]   metoprolol   succinate (TOPROL -XL) 50 MG 24 hr tablet Take 1 tablet (50 mg total) by mouth 2 (two) times daily. Take with or immediately following a meal.   [DISCONTINUED] apixaban  (ELIQUIS ) 5 MG TABS tablet Take 1 tablet (5 mg total) by mouth 2 (two) times daily. Bill SecondaryBETHA ALLY [id number], PCN [id number], GRP [id number], ID [id number]   [DISCONTINUED] metoprolol  succinate (TOPROL -XL) 50 MG 24 hr tablet Take 1 tablet (50 mg total) by mouth 2 (two) times daily. Take with or immediately following a meal.   No facility-administered encounter medications on file as of 09/22/2024.     Lab Results  Component Value Date   WBC 5.3 09/22/2024   HGB 11.5 (L) 09/22/2024   HCT 33.0 (L) 09/22/2024   PLT 180.0 09/22/2024   GLUCOSE 113 (H) 09/22/2024   CHOL 115 08/10/2024   TRIG 144.0 08/10/2024   HDL 45.20 08/10/2024   LDLCALC 41 08/10/2024   ALT 12 08/10/2024   AST 14 08/10/2024   NA 139 09/22/2024   K 4.9 09/22/2024   CL 105 09/22/2024   CREATININE 1.34 09/22/2024   BUN 19 09/22/2024   CO2 24 09/22/2024   TSH 1.18 08/10/2024   PSA 1.19 09/25/2023   INR 1.1 07/04/2024   HGBA1C 5.7 (H) 07/03/2024    DG Chest Port 1 View Result Date: 07/05/2024 EXAM: 1 VIEW(S) XRAY OF THE CHEST 07/05/2024 07:02:55 PM COMPARISON: 07/03/2024 CLINICAL HISTORY: Fever [id number]. fever. FINDINGS: LUNGS AND PLEURA: New patchy opacities in the lungs bilaterally, lower lung predominant, slightly favoring interstitial edema over multifocal pneumonia. Small left pleural effusion, new. No pneumothorax. HEART AND MEDIASTINUM: No acute abnormality of the cardiac and mediastinal silhouettes. Thoracic aortic atherosclerosis. BONES AND SOFT TISSUES: No acute osseous abnormality. IMPRESSION:  1. New bilateral lower lung predominant patchy opacities, slightly favoring interstitial edema over multifocal pneumonia. 2. Small left pleural effusion. Electronically signed by: Pinkie Pebbles MD 07/05/2024 07:42 PM EDT RP Workstation: [id number]    ECHOCARDIOGRAM LIMITED Result Date: 07/04/2024    ECHOCARDIOGRAM LIMITED REPORT   Patient Name:   Donald [name] Date of Exam: 07/04/2024 Medical Rec #:  [id number]    Height:       70.0 in Accession #:    [id number]   Weight:       170.0 lb Date of Birth:  09/07/1946    BSA:          1.948 m Patient Age:    78 years     BP:           146/68 mmHg Patient Gender: M            HR:           90 bpm. Exam Location:  [facility] Procedure: 2D Echo, Cardiac Doppler, Color Doppler and Intracardiac            Opacification Agent (Both Spectral and Color Flow Doppler were            utilized during procedure). Indications:     Heart block, LBBB I44.7, SOB, covid, NSTEMI  History:         Patient has prior history of Echocardiogram examinations. Risk                  Factors:Diabetes.  Sonographer:     Bari Roar Referring Phys:  [id number] CHRISTOPHER RONALD BERGE Diagnosing Phys: Timothy Gollan MD IMPRESSIONS  1. Left ventricular ejection fraction, by estimation, is 35 to 40%. The left ventricle has moderately decreased function. The left ventricle demonstrates global hypokinesis. Left ventricular diastolic parameters are consistent with Grade I diastolic dysfunction (impaired relaxation).  2. Right ventricular systolic function is normal. The right ventricular size is normal.  3. The mitral valve is normal in structure. Mild mitral valve regurgitation. No evidence of mitral stenosis.  4. The aortic valve is calcified. Aortic valve regurgitation is not visualized. Severe aortic valve stenosis by Aortic valve area, by VTI measures 0.48 cm. Aortic valve mean gradient measures 24.0 mmHg and Aortic valve Vmax measures 3.27 m/s, likely underestimated in setting of cardiomyopathy.  5. The inferior vena cava is normal in size with greater than 50% respiratory variability, suggesting right atrial pressure of 3 mmHg. FINDINGS  Left Ventricle: Left ventricular ejection fraction, by estimation, is 35 to 40%. The left ventricle has moderately  decreased function. The left ventricle demonstrates global hypokinesis. Definity  contrast agent was given IV to delineate the left ventricular endocardial borders. The left ventricular internal cavity size was normal in size. There is no left ventricular hypertrophy. Left ventricular diastolic parameters are consistent with Grade I diastolic dysfunction (impaired relaxation). Right Ventricle: The right ventricular size is normal. No increase in right ventricular wall thickness. Right ventricular systolic function is normal. Left Atrium: Left atrial size was normal in size. Right Atrium: Right atrial size was normal in size. Pericardium: There is no evidence of pericardial effusion. Mitral Valve: The mitral valve is normal in structure. Mild mitral valve regurgitation. No evidence of mitral valve stenosis. MV peak gradient, 8.9 mmHg. The mean mitral valve gradient is 4.0 mmHg. Tricuspid Valve: The tricuspid valve is normal in structure. Tricuspid valve regurgitation is mild . No evidence of tricuspid stenosis. Aortic Valve: The aortic valve is normal in structure. Aortic valve  regurgitation is not visualized. Severe aortic stenosis is present. Aortic valve mean gradient measures 24.0 mmHg. Aortic valve peak gradient measures 42.8 mmHg. Aortic valve area, by VTI measures 0.48 cm. Pulmonic Valve: The pulmonic valve was normal in structure. Pulmonic valve regurgitation is not visualized. No evidence of pulmonic stenosis. Aorta: The aortic root is normal in size and structure. Venous: The inferior vena cava is normal in size with greater than 50% respiratory variability, suggesting right atrial pressure of 3 mmHg. IAS/Shunts: No atrial level shunt detected by color flow Doppler. Additional Comments: Color Doppler performed.  LEFT VENTRICLE PLAX 2D LVIDd:         4.40 cm     Diastology LVIDs:         3.50 cm     LV e' medial:    5.33 cm/s LV PW:         1.00 cm     LV E/e' medial:  19.3 LV IVS:        1.30 cm     LV e'  lateral:   6.20 cm/s LVOT diam:     1.80 cm     LV E/e' lateral: 16.6 LV SV:         31 LV SV Index:   16 LVOT Area:     2.54 cm  LV Volumes (MOD) LV vol d, MOD A2C: 84.3 ml LV vol d, MOD A4C: 76.1 ml LV vol s, MOD A2C: 57.2 ml LV vol s, MOD A4C: 43.5 ml LV SV MOD A2C:     27.1 ml LV SV MOD A4C:     76.1 ml LV SV MOD BP:      30.7 ml RIGHT VENTRICLE RV Basal diam:  2.80 cm RV Mid diam:    2.60 cm RV S prime:     13.40 cm/s TAPSE (M-mode): 2.3 cm LEFT ATRIUM             Index        RIGHT ATRIUM           Index LA diam:        4.00 cm 2.05 cm/m   RA Area:     11.10 cm LA Vol (A2C):   48.3 ml 24.79 ml/m  RA Volume:   22.20 ml  11.40 ml/m LA Vol (A4C):   50.3 ml 25.82 ml/m LA Biplane Vol: 49.4 ml 25.36 ml/m  AORTIC VALVE                     PULMONIC VALVE AV Area (Vmax):    0.55 cm      PV Vmax:        0.95 m/s AV Area (Vmean):   0.53 cm      PV Peak grad:   3.6 mmHg AV Area (VTI):     0.48 cm      RVOT Peak grad: 2 mmHg AV Vmax:           327.00 cm/s AV Vmean:          228.000 cm/s AV VTI:            0.642 m AV Peak Grad:      42.8 mmHg AV Mean Grad:      24.0 mmHg LVOT Vmax:         71.00 cm/s LVOT Vmean:        47.300 cm/s LVOT VTI:          0.120 m LVOT/AV VTI ratio:  0.19  AORTA Ao Root diam: 2.80 cm MITRAL VALVE                TRICUSPID VALVE MV Area (PHT): 7.90 cm     TR Peak grad:   42.0 mmHg MV Area VTI:   1.77 cm     TR Vmax:        324.00 cm/s MV Peak grad:  8.9 mmHg MV Mean grad:  4.0 mmHg     SHUNTS MV Vmax:       1.49 m/s     Systemic VTI:  0.12 m MV Vmean:      89.7 cm/s    Systemic Diam: 1.80 cm MV Decel Time: 96 msec MV E velocity: 103.00 cm/s MV A velocity: 140.00 cm/s MV E/A ratio:  0.74 MV A Prime:    16.6 cm/s Evalene Lunger MD Electronically signed by Evalene Lunger MD Signature Date/Time: 07/04/2024/4:38:39 PM    Final        Assessment & Plan:  PAF (paroxysmal atrial fibrillation) (HCC) Assessment & Plan: Continue amiodarone  and eliquis . Continue toprol . Rate controlled. Last  visit, was referred for pt assistance for eliquis . Discussed f/u with pharmacy today. He did qualify. He will let me now if problems. EKG - SR LBBB. Discussed with cardiology. Scheduled for f/u next week for further evaluation.   Orders: -     EKG 12-Lead  Decreased GFR -     Basic metabolic panel with GFR  Anemia, unspecified type -     CBC with Differential/Platelet  Weakness Assessment & Plan: Recent hospitalization as outlined. Is doing some better. Still with weakness and fatigue. Last visit, requested  PT - Renew - Josh. Continue PT/exercise.    Type 2 diabetes mellitus with hyperglycemia, without long-term current use of insulin  [facility]) Assessment & Plan: Follow met b and A1c.    Rheumatoid arthritis, involving unspecified site, unspecified whether rheumatoid factor present [facility]) Assessment & Plan: Continue f/u with rheumatology. Continue arava . Overall stable.    Hypercholesterolemia Assessment & Plan: Continue crestor . Follow lipid panel.    Dilated cardiomyopathy (HCC) Assessment & Plan:  Admitted for sepsis due to covid infection. S/p remdesivir  and completed abx course. Off oxygen. Hospital course complicated by afib with RVR. Started on amiodarone  - now on 200mg  daily. Also on metoprolol  and eliquis . S/p Non STEMI - troponin peaked 1099. EF 35%. HFrEF - ECHO with EF 35-40%, severe AS. No evidence of volume overload on exam. Increased fatigue. Decreased energy. EKG SR - LBBB - discussed with radiology. Rescheduled f/u appt to next week.    Dementia without behavioral disturbance (HCC) Assessment & Plan: Continue exelon  for dementia. Resume arava  on discharge.   Aortic valve stenosis, etiology of cardiac valve disease unspecified Assessment & Plan: ECHO 02/2022 - Echo showed LVEF 50-55%, no WMA, G1DD, trivial MR, mod to severe AS with Aortic valve mean gradient , peak gradient , , Vmax 3.06  m/s, DVI = 0.26, Aortic valve area 0.95.  F/u ECHO 03/2023 -  moderate to severe AS.   Dr Verlin evaluated 11/15/23 - f/u echo in 6 months.  Dr Verlin evlauated 05/2024 - f/u echo in 6 months.   Admitted for sepsis due to covid infection. S/p remdesivir  and completed abx course. Off oxygen. Hospital course complicated by afib with RVR. Started on amiodarone  - now on 200mg  daily. Also on metoprolol  and eliquis . S/p Non STEMI - troponin peaked 1099. EF 35%. HFrEF - ECHO with EF 35-40%, severe AS. Discussed f/u  with cardiology. Scheduled for next week.    Other orders -     Apixaban ; Take 1 tablet (5 mg total) by mouth 2 (two) times daily. Bill SecondaryBETHA ALLY [id number], PCN [id number], GRP [id number], ID [id number]  Dispense: 180 tablet; Refill: 0 -     Metoprolol  Succinate ER; Take 1 tablet (50 mg total) by mouth 2 (two) times daily. Take with or immediately following a meal.  Dispense: 180 tablet; Refill: 1  I spent 45 minutes with the patient.   Time spent discussing his current concerns and symptoms. Specifically time spent discussing increased fatigue and afib with cardiomyopathy.  Time also spent discussing further w/up, evaluation and treatment.    Allena Hamilton, MD "

## 2024-09-23 ENCOUNTER — Ambulatory Visit: Payer: Self-pay | Admitting: Internal Medicine

## 2024-09-23 LAB — BASIC METABOLIC PANEL WITH GFR
BUN: 19 mg/dL (ref 6–23)
CO2: 24 meq/L (ref 19–32)
Calcium: 8.8 mg/dL (ref 8.4–10.5)
Chloride: 105 meq/L (ref 96–112)
Creatinine, Ser: 1.34 mg/dL (ref 0.40–1.50)
GFR: 51.41 mL/min — ABNORMAL LOW
Glucose, Bld: 113 mg/dL — ABNORMAL HIGH (ref 70–99)
Potassium: 4.9 meq/L (ref 3.5–5.1)
Sodium: 139 meq/L (ref 135–145)

## 2024-09-23 LAB — CBC WITH DIFFERENTIAL/PLATELET
Basophils Absolute: 0.1 K/uL (ref 0.0–0.1)
Basophils Relative: 1.3 % (ref 0.0–3.0)
Eosinophils Absolute: 0.3 K/uL (ref 0.0–0.7)
Eosinophils Relative: 4.7 % (ref 0.0–5.0)
HCT: 33 % — ABNORMAL LOW (ref 39.0–52.0)
Hemoglobin: 11.5 g/dL — ABNORMAL LOW (ref 13.0–17.0)
Lymphocytes Relative: 11.1 % — ABNORMAL LOW (ref 12.0–46.0)
Lymphs Abs: 0.6 K/uL — ABNORMAL LOW (ref 0.7–4.0)
MCHC: 34.7 g/dL (ref 30.0–36.0)
MCV: 96.7 fl (ref 78.0–100.0)
Monocytes Absolute: 0.7 K/uL (ref 0.1–1.0)
Monocytes Relative: 12.5 % — ABNORMAL HIGH (ref 3.0–12.0)
Neutro Abs: 3.7 K/uL (ref 1.4–7.7)
Neutrophils Relative %: 70.4 % (ref 43.0–77.0)
Platelets: 180 K/uL (ref 150.0–400.0)
RBC: 3.42 Mil/uL — ABNORMAL LOW (ref 4.22–5.81)
RDW: 13.9 % (ref 11.5–15.5)
WBC: 5.3 K/uL (ref 4.0–10.5)

## 2024-09-27 ENCOUNTER — Ambulatory Visit: Payer: Self-pay | Admitting: Internal Medicine

## 2024-09-27 ENCOUNTER — Encounter: Payer: Self-pay | Admitting: Internal Medicine

## 2024-09-27 DIAGNOSIS — R944 Abnormal results of kidney function studies: Secondary | ICD-10-CM

## 2024-09-27 DIAGNOSIS — D649 Anemia, unspecified: Secondary | ICD-10-CM

## 2024-09-27 MED ORDER — APIXABAN 5 MG PO TABS: 5.0000 mg | ORAL_TABLET | Freq: Two times a day (BID) | ORAL | 0 refills | Status: AC

## 2024-09-27 MED ORDER — METOPROLOL SUCCINATE ER 50 MG PO TB24: 50.0000 mg | ORAL_TABLET | Freq: Two times a day (BID) | ORAL | 1 refills | Status: AC

## 2024-09-27 NOTE — Assessment & Plan Note (Signed)
 Continue amiodarone  and eliquis . Continue toprol . Rate controlled. Last visit, was referred for pt assistance for eliquis . Discussed f/u with pharmacy today. He did qualify. He will let me now if problems. EKG - SR LBBB. Discussed with cardiology. Scheduled for f/u next week for further evaluation.

## 2024-09-27 NOTE — Assessment & Plan Note (Signed)
 Continue f/u with rheumatology. Continue arava . Overall stable.

## 2024-09-27 NOTE — Assessment & Plan Note (Signed)
 Recent hospitalization as outlined. Is doing some better. Still with weakness and fatigue. Last visit, requested  PT - Renew - Josh. Continue PT/exercise.

## 2024-09-27 NOTE — Assessment & Plan Note (Signed)
 ECHO 02/2022 - Echo showed LVEF 50-55%, no WMA, G1DD, trivial MR, mod to severe AS with Aortic valve mean gradient , peak gradient , , Vmax 3.06  m/s, DVI = 0.26, Aortic valve area 0.95.  F/u ECHO 03/2023 - moderate to severe AS.   Dr Verlin evaluated 11/15/23 - f/u echo in 6 months.  Dr Verlin evlauated 05/2024 - f/u echo in 6 months.   Admitted for sepsis due to covid infection. S/p remdesivir  and completed abx course. Off oxygen. Hospital course complicated by afib with RVR. Started on amiodarone  - now on 200mg  daily. Also on metoprolol  and eliquis . S/p Non STEMI - troponin peaked 1099. EF 35%. HFrEF - ECHO with EF 35-40%, severe AS. Discussed f/u with cardiology. Scheduled for next week.

## 2024-09-27 NOTE — Assessment & Plan Note (Signed)
 Continue crestor . Follow lipid panel.

## 2024-09-27 NOTE — Assessment & Plan Note (Signed)
 Follow met b and A1c.

## 2024-09-27 NOTE — Assessment & Plan Note (Signed)
 Continue exelon  for dementia. Resume arava  on discharge.

## 2024-09-27 NOTE — Assessment & Plan Note (Signed)
"   Admitted for sepsis due to covid infection. S/p remdesivir  and completed abx course. Off oxygen. Hospital course complicated by afib with RVR. Started on amiodarone  - now on 200mg  daily. Also on metoprolol  and eliquis . S/p Non STEMI - troponin peaked 1099. EF 35%. HFrEF - ECHO with EF 35-40%, severe AS. No evidence of volume overload on exam. Increased fatigue. Decreased energy. EKG SR - LBBB - discussed with radiology. Rescheduled f/u appt to next week.  "

## 2024-09-28 ENCOUNTER — Encounter: Payer: Self-pay | Admitting: Cardiovascular Disease

## 2024-09-28 ENCOUNTER — Ambulatory Visit: Admitting: Cardiovascular Disease

## 2024-09-28 VITALS — BP 150/81 | HR 63 | Ht 70.0 in | Wt 169.0 lb

## 2024-09-28 DIAGNOSIS — I350 Nonrheumatic aortic (valve) stenosis: Secondary | ICD-10-CM | POA: Diagnosis not present

## 2024-09-28 DIAGNOSIS — I428 Other cardiomyopathies: Secondary | ICD-10-CM | POA: Diagnosis not present

## 2024-09-28 DIAGNOSIS — I480 Paroxysmal atrial fibrillation: Secondary | ICD-10-CM

## 2024-09-28 NOTE — Progress Notes (Signed)
 "  Structural Heart Clinic Note  Chief Complaint  Patient presents with   Follow-up    Aortic stenosis   History of Present Illness: 79 yo [name] with history of mild dementia, atrial fibrillation, hyperlipidemia, DM, rheumatoid arthritis and aortic stenosis who is here today for follow up. Echo in 2023 and 2024 with moderate AS with progression to severe aortic stenosis in February 2025. He was feeling well when I saw him in March 2025 and we elected to follow his aortic stenosis. Echo September 2025 with LVEF=50-Donald%. Mild MR. Severe aortic stenosis with mean gradient 40 mmHg, AVA 0.6 cm2, DI 0.18, SVI 25. He was admitted with Covid pneumonia with sepsis in October 2025 and was treated with antibiotics and Remdesivir . His hospitalization was complicated by atrial fib with RVR. He was started on amiodarone , metoprolol  and Eliquis . Echo 07/04/24 with LVEF=35-40% with global hypokinesis. Mild MR. Severe low flow/low gradient aortic stenosis with mean gradient 24 mmHg, AVA 0.48 cm2, SVI 16, DI 0.19. This echo   He is here today for follow up. The patient denies any chest pain, dyspnea, palpitations, lower extremity edema, orthopnea, PND, dizziness, near syncope or syncope. He has ongoing fatigue and chronic back issues. His wife confirms that he is limited by his back issues. He has fatigue since his Covid infection in October 2025.   He lives in [place], [place] with his wife. He is a retired curator. He has no active dental issues.   Primary Care Physician: Glendia Shad, MD Primary Cardiologist: Darliss Referring Cardiologist: Darliss  Past Medical History:  Diagnosis Date   Bleeding hemorrhoids    a. 2015 - FOB+ stool, colonscopy - thrombosed int hemorrhoids.   Carpal tunnel syndrome    Diabetes mellitus without complication (HCC)    Hyperlipidemia    Nephrolithiasis    H/O stones   Rheumatoid arthritis (HCC)    Severe aortic stenosis    a. 05/2024 Echo: EF 50-Donald% without regional  wall motion abnormalities, mild LVH, grade 1 diastolic dysfunction, normal RV function, mild MR, mild AI, and severe aortic stenosis with a mean aortic valve gradient of 40 mmHg, peak gradient of 62, and aortic valve area of 0.6 cm.    Past Surgical History:  Procedure Laterality Date   APPENDECTOMY  1962   CARPAL TUNNEL RELEASE Bilateral    hands    Current Outpatient Medications  Medication Sig Dispense Refill   ACCU-CHEK AVIVA PLUS test strip TEST TWO TIMES DAILY 200 strip 2   ACCU-CHEK SOFTCLIX LANCETS lancets TEST TWO TIMES DAILY 200 each 0   amiodarone  (PACERONE ) 200 MG tablet Take 1 tablet (200 mg total) by mouth 2 (two) times daily for 7 days, THEN 1 tablet (200 mg total) daily. (Patient taking differently: Take one tablet by mouth once daily)     apixaban  (ELIQUIS ) 5 MG TABS tablet Take 1 tablet (5 mg total) by mouth 2 (two) times daily. Bill SecondaryBETHA ALLY [id number], PCN [id number], GRP [id number], ID [id number] 180 tablet 0   leflunomide  (ARAVA ) 10 MG tablet Take 10 mg by mouth daily.      methocarbamol  (ROBAXIN ) 500 MG tablet Take by mouth.     metoprolol  succinate (TOPROL -XL) 50 MG 24 hr tablet Take 1 tablet (50 mg total) by mouth 2 (two) times daily. Take with or immediately following a meal. 180 tablet 1   rivastigmine  (EXELON ) 1.5 MG capsule Take 1.5 mg by mouth 2 (two) times daily.     rosuvastatin  (CRESTOR ) 10 MG tablet Take 1  tablet (10 mg total) by mouth daily. 90 tablet 3   traZODone  (DESYREL ) 50 MG tablet Take 0.5 tablets (25 mg total) by mouth at bedtime as needed for sleep. 30 tablet 1   No current facility-administered medications for this visit.    No Known Allergies  Social History   Socioeconomic History   Marital status: Married    Spouse name: Not on file   Number of children: 3   Years of education: Not on file   Highest education level: Not on file  Occupational History   Occupation: Retired-Mechanic  Tobacco Use   Smoking status: Former    Types:  Cigarettes   Smokeless tobacco: Never  Vaping Use   Vaping status: Never Used  Substance and Sexual Activity   Alcohol use: No    Alcohol/week: 0.0 standard drinks of alcohol   Drug use: No   Sexual activity: Not Currently  Other Topics Concern   Not on file  Social History Narrative   Married   Social Drivers of Health   Tobacco Use: Medium Risk (09/28/2024)   Patient History    Smoking Tobacco Use: Former    Smokeless Tobacco Use: Never    Passive Exposure: Not on file  Financial Resource Strain: Low Risk  (12/19/2023)   Received from [facility]   Overall Financial Resource Strain (CARDIA)    Difficulty of Paying Living Expenses: Not hard at all  Food Insecurity: No Food Insecurity (07/04/2024)   Epic    Worried About Radiation Protection Practitioner of Food in the Last Year: Never true    Ran Out of Food in the Last Year: Never true  Transportation Needs: No Transportation Needs (07/04/2024)   Epic    Lack of Transportation (Medical): No    Lack of Transportation (Non-Medical): No  Physical Activity: Inactive (08/20/2023)   Exercise Vital Sign    Days of Exercise per Week: 0 days    Minutes of Exercise per Session: 0 min  Stress: No Stress Concern Present (08/20/2023)   [employer] of Occupational Health - Occupational Stress Questionnaire    Feeling of Stress : Not at all  Social Connections: Moderately Integrated (07/04/2024)   Social Connection and Isolation Panel    Frequency of Communication with Friends and Family: More than three times a week    Frequency of Social Gatherings with Friends and Family: Three times a week    Attends Religious Services: More than 4 times per year    Active Member of Clubs or Organizations: No    Attends Banker Meetings: Never    Marital Status: Married  Catering Manager Violence: Not At Risk (07/04/2024)   Epic    Fear of Current or Ex-Partner: No    Emotionally Abused: No    Physically Abused: No    Sexually  Abused: No  Depression (PHQ2-9): Low Risk (09/22/2024)   Depression (PHQ2-9)    PHQ-2 Score: 0  Alcohol Screen: Low Risk (08/20/2023)   Alcohol Screen    Last Alcohol Screening Score (AUDIT): 0  Housing: Low Risk (07/04/2024)   Epic    Unable to Pay for Housing in the Last Year: No    Number of Times Moved in the Last Year: 0    Homeless in the Last Year: No  Utilities: Not At Risk (07/04/2024)   Epic    Threatened with loss of utilities: No  Health Literacy: Adequate Health Literacy (08/20/2023)   B1300 Health Literacy    Frequency of  need for help with medical instructions: Never    Family History  Problem Relation Age of Onset   Heart disease Father        Heart attack-54   Hypertension Father    Diabetes Maternal Grandmother     Review of Systems:  As stated in the HPI and otherwise negative.   BP (!) 150/81   Pulse 63   Ht 5' 10 (1.778 m)   Wt 169 lb (76.7 kg)   SpO2 98%   BMI 24.25 kg/m   Physical Examination: General: Well developed, well nourished, NAD  HEENT: OP clear, mucus membranes moist  SKIN: warm, dry. No rashes. Neuro: No focal deficits  Musculoskeletal: Muscle strength 5/5 all ext  Psychiatric: Mood and affect normal  Neck: No JVD Lungs:Clear bilaterally, no wheezes, rhonci, crackles Cardiovascular: Regular rate and rhythm. Harsh systolic murmur.  Abdomen:Soft. Bowel sounds present. Non-tender.  Extremities: No lower extremity edema.   EKG:  EKG is not ordered today. The ekg ordered today demonstrates   Echo 07/04/24: 1. Left ventricular ejection fraction, by estimation, is 35 to 40%. The  left ventricle has moderately decreased function. The left ventricle  demonstrates global hypokinesis. Left ventricular diastolic parameters are  consistent with Grade I diastolic  dysfunction (impaired relaxation).   2. Right ventricular systolic function is normal. The right ventricular  size is normal.   3. The mitral valve is normal in structure.  Mild mitral valve  regurgitation. No evidence of mitral stenosis.   4. The aortic valve is calcified. Aortic valve regurgitation is not  visualized. Severe aortic valve stenosis by Aortic valve area, by VTI  measures 0.48 cm. Aortic valve mean gradient measures 24.0 mmHg and  Aortic valve Vmax measures 3.27 m/s, likely  underestimated in setting of cardiomyopathy.   5. The inferior vena cava is normal in size with greater than 50%  respiratory variability, suggesting right atrial pressure of 3 mmHg.   FINDINGS   Left Ventricle: Left ventricular ejection fraction, by estimation, is 35  to 40%. The left ventricle has moderately decreased function. The left  ventricle demonstrates global hypokinesis. Definity  contrast agent was  given IV to delineate the left  ventricular endocardial borders. The left ventricular internal cavity size  was normal in size. There is no left ventricular hypertrophy. Left  ventricular diastolic parameters are consistent with Grade I diastolic  dysfunction (impaired relaxation).   Right Ventricle: The right ventricular size is normal. No increase in  right ventricular wall thickness. Right ventricular systolic function is  normal.   Left Atrium: Left atrial size was normal in size.   Right Atrium: Right atrial size was normal in size.   Pericardium: There is no evidence of pericardial effusion.   Mitral Valve: The mitral valve is normal in structure. Mild mitral valve  regurgitation. No evidence of mitral valve stenosis. MV peak gradient, 8.9  mmHg. The mean mitral valve gradient is 4.0 mmHg.   Tricuspid Valve: The tricuspid valve is normal in structure. Tricuspid  valve regurgitation is mild . No evidence of tricuspid stenosis.   Aortic Valve: The aortic valve is normal in structure. Aortic valve  regurgitation is not visualized. Severe aortic stenosis is present. Aortic  valve mean gradient measures 24.0 mmHg. Aortic valve peak gradient  measures  42.8 mmHg. Aortic valve area, by  VTI measures 0.48 cm.   Pulmonic Valve: The pulmonic valve was normal in structure. Pulmonic valve  regurgitation is not visualized. No evidence of pulmonic stenosis.  Aorta: The aortic root is normal in size and structure.   Venous: The inferior vena cava is normal in size with greater than 50%  respiratory variability, suggesting right atrial pressure of 3 mmHg.   IAS/Shunts: No atrial level shunt detected by color flow Doppler.   Additional Comments: Color Doppler performed.    LEFT VENTRICLE  PLAX 2D  LVIDd:         4.40 cm     Diastology  LVIDs:         3.50 cm     LV e' medial:    5.33 cm/s  LV PW:         1.00 cm     LV E/e' medial:  19.3  LV IVS:        1.30 cm     LV e' lateral:   6.20 cm/s  LVOT diam:     1.80 cm     LV E/e' lateral: 16.6  LV SV:         31  LV SV Index:   16  LVOT Area:     2.54 cm    LV Volumes (MOD)  LV vol d, MOD A2C: 84.3 ml  LV vol d, MOD A4C: 76.1 ml  LV vol s, MOD A2C: 57.2 ml  LV vol s, MOD A4C: 43.5 ml  LV SV MOD A2C:     27.1 ml  LV SV MOD A4C:     76.1 ml  LV SV MOD BP:      30.7 ml   RIGHT VENTRICLE  RV Basal diam:  2.80 cm  RV Mid diam:    2.60 cm  RV S prime:     13.40 cm/s  TAPSE (M-mode): 2.3 cm   LEFT ATRIUM             Index        RIGHT ATRIUM           Index  LA diam:        4.00 cm 2.05 cm/m   RA Area:     11.10 cm  LA Vol (A2C):   48.3 ml 24.79 ml/m  RA Volume:   22.20 ml  11.40 ml/m  LA Vol (A4C):   50.3 ml 25.82 ml/m  LA Biplane Vol: 49.4 ml 25.36 ml/m   AORTIC VALVE                     PULMONIC VALVE  AV Area (Vmax):    0.Donald cm      PV Vmax:        0.95 m/s  AV Area (Vmean):   0.53 cm      PV Peak grad:   3.6 mmHg  AV Area (VTI):     0.48 cm      RVOT Peak grad: 2 mmHg  AV Vmax:           327.00 cm/s  AV Vmean:          228.000 cm/s  AV VTI:            0.642 m  AV Peak Grad:      42.8 mmHg  AV Mean Grad:      24.0 mmHg  LVOT Vmax:         71.00 cm/s  LVOT Vmean:         47.300 cm/s  LVOT VTI:          0.120 m  LVOT/AV VTI  ratio: 0.19    AORTA  Ao Root diam: 2.80 cm   MITRAL VALVE                TRICUSPID VALVE  MV Area (PHT): 7.90 cm     TR Peak grad:   42.0 mmHg  MV Area VTI:   1.77 cm     TR Vmax:        324.00 cm/s  MV Peak grad:  8.9 mmHg  MV Mean grad:  4.0 mmHg     SHUNTS  MV Vmax:       1.49 m/s     Systemic VTI:  0.12 m  MV Vmean:      89.7 cm/s    Systemic Diam: 1.80 cm  MV Decel Time: 96 msec  MV E velocity: 103.00 cm/s  MV A velocity: 140.00 cm/s  MV E/A ratio:  0.74  MV A Prime:    16.6 cm/s   Recent Labs: 07/13/2024: B Natriuretic Peptide 618.3 07/15/2024: Magnesium  2.2 08/10/2024: ALT 12; TSH 1.18 09/22/2024: BUN 19; Creatinine, Ser 1.34; Hemoglobin 11.5; Platelets 180.0; Potassium 4.9; Sodium 139    Wt Readings from Last 3 Encounters:  09/28/24 169 lb (76.7 kg)  09/22/24 165 lb 3.2 oz (74.9 kg)  08/10/24 162 lb 3.2 oz (73.6 kg)    Assessment and Plan:   1. Severe Aortic Valve Stenosis: He has severe low flow/low gradient, stage D2 aortic valve stenosis. NYHA class 2 symptoms. I have personally reviewed the echo images. The aortic valve is thickened and calcified with limited leaflet mobility. His LVEF is down on the most recent echo but this was during an admission with sepsis and atrial fibrillation. At this time, we will plan to repeat an echo. If his LVEF is still reduced, we will need to move toward  AVR. Given advanced age, he is not a good candidate for conventional AVR by surgical approach. I think he may be a good candidate for TAVR.   I have reviewed the natural history of aortic stenosis with the patient and their family members  who are present today. We have discussed the limitations of medical therapy and the poor prognosis associated with symptomatic aortic stenosis. We have reviewed potential treatment options, including palliative medical therapy, conventional surgical aortic valve replacement, and transcatheter  aortic valve replacement. We discussed treatment options in the context of the patient's specific comorbid medical conditions.   Repeat echo over the next few weeks then follow up in the office to review.    2. Atrial fibrillation, paroxysmal: Sinus today on exam. Continue Eliquis , amiodarone  and Lopressor   3. 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 With recent echo findings during admission for Covid pneumonia/atrial fib, will repeat echo now to assess LVEF.    Labs/ tests ordered today include:  Orders Placed This Encounter  Procedures   ECHOCARDIOGRAM COMPLETE   Disposition:   F/U will be arranged with me in 2-3 months  Signed, Lonni Cash, MD, [facility] 09/28/2024 11:37 AM    [facility] [address] [place], [place], [place]  [address] Phone: [phone]; Fax: [phone]     "

## 2024-09-28 NOTE — Patient Instructions (Signed)
 Medication Instructions:  Your physician recommends that you continue on your current medications as directed. Please refer to the Current Medication list given to you today.  *If you need a refill on your cardiac medications before your next appointment, please call your pharmacy*  Lab Work: None ordered If you have labs (blood work) drawn today and your tests are completely normal, you will receive your results only by: MyChart Message (if you have MyChart) OR A paper copy in the mail If you have any lab test that is abnormal or we need to change your treatment, we will call you to review the results.  Testing/Procedures: Your physician has requested that you have an echocardiogram in one month. Echocardiography is a painless test that uses sound waves to create images of your heart. It provides your doctor with information about the size and shape of your heart and how well your hearts chambers and valves are working. This procedure takes approximately one hour. There are no restrictions for this procedure. Please do NOT wear cologne, perfume, aftershave, or lotions (deodorant is allowed). Please arrive 15 minutes prior to your appointment time.  Please note: We ask at that you not bring children with you during ultrasound (echo/ vascular) testing. Due to room size and safety concerns, children are not allowed in the ultrasound rooms during exams. Our front office staff cannot provide observation of children in our lobby area while testing is being conducted. An adult accompanying a patient to their appointment will only be allowed in the ultrasound room at the discretion of the ultrasound technician under special circumstances. We apologize for any inconvenience.   Follow-Up: At [facility], you and your health needs are our priority.  As part of our continuing mission to provide you with exceptional heart care, our providers are all part of one team.  This team includes your  primary Cardiologist (physician) and Advanced Practice Providers or APPs (Physician Assistants and Nurse Practitioners) who all work together to provide you with the care you need, when you need it.  Your next appointment:   3-4 month(s)  Provider:   Lonni Cash, MD

## 2024-10-20 ENCOUNTER — Other Ambulatory Visit

## 2024-10-29 ENCOUNTER — Ambulatory Visit

## 2024-11-13 ENCOUNTER — Ambulatory Visit: Admitting: Cardiovascular Disease

## 2024-11-18 ENCOUNTER — Ambulatory Visit: Admitting: Internal Medicine

## 2024-11-19 ENCOUNTER — Other Ambulatory Visit (HOSPITAL_COMMUNITY)

## 2024-11-19 ENCOUNTER — Other Ambulatory Visit

## 2024-11-26 ENCOUNTER — Ambulatory Visit: Admitting: Cardiovascular Disease

## 2024-12-31 ENCOUNTER — Ambulatory Visit: Admitting: Cardiovascular Disease
